# Patient Record
Sex: Female | Born: 1960 | Race: Black or African American | Hispanic: No | Marital: Single
Health system: Southern US, Community
[De-identification: ages and names within clinical notes are randomized; demographics above are authoritative.]

## PROBLEM LIST (undated history)

## (undated) DIAGNOSIS — I10 Essential (primary) hypertension: Secondary | ICD-10-CM

## (undated) DIAGNOSIS — M797 Fibromyalgia: Secondary | ICD-10-CM

## (undated) DIAGNOSIS — F329 Major depressive disorder, single episode, unspecified: Secondary | ICD-10-CM

## (undated) DIAGNOSIS — I2510 Atherosclerotic heart disease of native coronary artery without angina pectoris: Secondary | ICD-10-CM

## (undated) DIAGNOSIS — R519 Headache, unspecified: Secondary | ICD-10-CM

## (undated) DIAGNOSIS — Z86711 Personal history of pulmonary embolism: Secondary | ICD-10-CM

## (undated) DIAGNOSIS — E7800 Pure hypercholesterolemia, unspecified: Secondary | ICD-10-CM

## (undated) DIAGNOSIS — F32A Depression, unspecified: Secondary | ICD-10-CM

## (undated) DIAGNOSIS — Z951 Presence of aortocoronary bypass graft: Secondary | ICD-10-CM

## (undated) DIAGNOSIS — R51 Headache: Secondary | ICD-10-CM

## (undated) HISTORY — PX: CARDIAC SURGERY: SHX584

## (undated) HISTORY — PX: HERNIA REPAIR: SHX51

## (undated) HISTORY — PX: CARPAL TUNNEL RELEASE: SHX101

## (undated) HISTORY — PX: TUBAL LIGATION: SHX77

## (undated) HISTORY — PX: CHOLECYSTECTOMY: SHX55

## (undated) HISTORY — PX: CORONARY ARTERY BYPASS GRAFT: SHX141

## (undated) HISTORY — PX: BACK SURGERY: SHX140

---
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
## 2000-09-01 ENCOUNTER — Encounter: Payer: Self-pay | Admitting: Family Medicine

## 2000-09-01 ENCOUNTER — Ambulatory Visit (HOSPITAL_COMMUNITY): Admission: RE | Admit: 2000-09-01 | Discharge: 2000-09-01 | Payer: Self-pay | Admitting: Family Medicine

## 2001-06-15 ENCOUNTER — Other Ambulatory Visit: Admission: RE | Admit: 2001-06-15 | Discharge: 2001-06-15 | Payer: Self-pay | Admitting: Obstetrics and Gynecology

## 2001-07-29 ENCOUNTER — Emergency Department (HOSPITAL_COMMUNITY): Admission: EM | Admit: 2001-07-29 | Discharge: 2001-07-29 | Payer: Self-pay | Admitting: Emergency Medicine

## 2001-07-29 ENCOUNTER — Encounter: Payer: Self-pay | Admitting: Emergency Medicine

## 2002-01-27 ENCOUNTER — Encounter: Admission: RE | Admit: 2002-01-27 | Discharge: 2002-01-27 | Payer: Self-pay | Admitting: Family Medicine

## 2002-01-27 ENCOUNTER — Encounter: Payer: Self-pay | Admitting: Family Medicine

## 2002-06-28 ENCOUNTER — Encounter: Admission: RE | Admit: 2002-06-28 | Discharge: 2002-09-26 | Payer: Self-pay

## 2003-06-27 ENCOUNTER — Ambulatory Visit (HOSPITAL_BASED_OUTPATIENT_CLINIC_OR_DEPARTMENT_OTHER): Admission: RE | Admit: 2003-06-27 | Discharge: 2003-06-27 | Payer: Self-pay | Admitting: Neurological Surgery

## 2003-09-26 ENCOUNTER — Ambulatory Visit (HOSPITAL_BASED_OUTPATIENT_CLINIC_OR_DEPARTMENT_OTHER): Admission: RE | Admit: 2003-09-26 | Discharge: 2003-09-26 | Payer: Self-pay | Admitting: Neurological Surgery

## 2004-02-17 ENCOUNTER — Inpatient Hospital Stay (HOSPITAL_COMMUNITY): Admission: RE | Admit: 2004-02-17 | Discharge: 2004-02-19 | Payer: Self-pay | Admitting: Neurological Surgery

## 2004-03-19 ENCOUNTER — Encounter: Admission: RE | Admit: 2004-03-19 | Discharge: 2004-03-19 | Payer: Self-pay | Admitting: Neurological Surgery

## 2004-04-16 ENCOUNTER — Encounter: Admission: RE | Admit: 2004-04-16 | Discharge: 2004-04-16 | Payer: Self-pay | Admitting: Neurological Surgery

## 2004-08-07 ENCOUNTER — Encounter: Admission: RE | Admit: 2004-08-07 | Discharge: 2004-08-07 | Payer: Self-pay | Admitting: Neurological Surgery

## 2004-08-07 ENCOUNTER — Emergency Department (HOSPITAL_COMMUNITY): Admission: EM | Admit: 2004-08-07 | Discharge: 2004-08-07 | Payer: Self-pay | Admitting: *Deleted

## 2004-08-14 ENCOUNTER — Encounter: Admission: RE | Admit: 2004-08-14 | Discharge: 2004-08-14 | Payer: Self-pay | Admitting: Neurological Surgery

## 2004-11-05 ENCOUNTER — Encounter: Admission: RE | Admit: 2004-11-05 | Discharge: 2004-11-05 | Payer: Self-pay | Admitting: Neurological Surgery

## 2005-03-05 ENCOUNTER — Encounter: Admission: RE | Admit: 2005-03-05 | Discharge: 2005-03-05 | Payer: Self-pay | Admitting: Neurological Surgery

## 2005-04-12 ENCOUNTER — Inpatient Hospital Stay (HOSPITAL_COMMUNITY): Admission: RE | Admit: 2005-04-12 | Discharge: 2005-04-14 | Payer: Self-pay | Admitting: Neurological Surgery

## 2005-05-03 ENCOUNTER — Ambulatory Visit (HOSPITAL_COMMUNITY): Admission: RE | Admit: 2005-05-03 | Discharge: 2005-05-03 | Payer: Self-pay | Admitting: Unknown Physician Specialty

## 2005-07-09 ENCOUNTER — Encounter: Admission: RE | Admit: 2005-07-09 | Discharge: 2005-07-09 | Payer: Self-pay | Admitting: Neurological Surgery

## 2006-01-20 ENCOUNTER — Encounter: Admission: RE | Admit: 2006-01-20 | Discharge: 2006-01-20 | Payer: Self-pay | Admitting: Neurological Surgery

## 2006-07-21 ENCOUNTER — Encounter: Admission: RE | Admit: 2006-07-21 | Discharge: 2006-07-21 | Payer: Self-pay | Admitting: Neurological Surgery

## 2006-08-25 ENCOUNTER — Emergency Department (HOSPITAL_COMMUNITY): Admission: EM | Admit: 2006-08-25 | Discharge: 2006-08-26 | Payer: Self-pay | Admitting: Emergency Medicine

## 2006-09-10 ENCOUNTER — Emergency Department (HOSPITAL_COMMUNITY): Admission: EM | Admit: 2006-09-10 | Discharge: 2006-09-10 | Payer: Self-pay | Admitting: Emergency Medicine

## 2010-01-31 ENCOUNTER — Emergency Department (HOSPITAL_COMMUNITY): Admission: EM | Admit: 2010-01-31 | Discharge: 2010-01-31 | Payer: Self-pay | Admitting: Emergency Medicine

## 2010-02-13 ENCOUNTER — Encounter: Admission: RE | Admit: 2010-02-13 | Discharge: 2010-02-13 | Payer: Self-pay | Admitting: Pain Medicine

## 2010-12-21 NOTE — Op Note (Signed)
Selena Clark, BALDRIDGE                        ACCOUNT NO.:  [id number]   MEDICAL RECORD NO.:  [id number]                   PATIENT TYPE:  AMB   LOCATION:  DSC                                  FACILITY:  [facility]   PHYSICIAN:  Tia Alert, MD                  DATE OF BIRTH:  Nov 30, 1960   DATE OF PROCEDURE:  06/27/2003  DATE OF DISCHARGE:                                 OPERATIVE REPORT   PREOPERATIVE DIAGNOSIS:  Right carpal tunnel syndrome.   POSTOPERATIVE DIAGNOSIS:  Right carpal tunnel syndrome.   PROCEDURE:  Right carpal tunnel release.   SURGEON:  Tia Alert, MD   ANESTHESIA:  Local regional.   COMPLICATIONS:  None apparent.   INDICATIONS FOR PROCEDURE:  Ms. Hibbitts is a  50 year old black female seen in  neurosurgical consultation  for numbness in her hands  bilaterally. She had  an MRI which showed some cervical spondylosis, but I felt her symptoms were  more  consistent with a carpal tunnel syndrome. She had numbness in the  carpal tunnel distribution in her hands. It was awaking her at night. She  was dropping objects. She had EMGs and PNCVs consistent with bilateral  carpal tunnel syndrome which was worse on the right than on the left, and  her symptoms were worse on the right than on the left. I recommended staged  carpal tunnel release starting on the right side. She understood the  risks,  benefits and alternatives and wished to proceed.   DESCRIPTION OF PROCEDURE:  The patient was taken to the operating room and  local region anesthesia was used. Her right arm was placed in a tourniquet  at the forearm level. The right arm was prepped circumferentially with  Duraprep and then draped  in the usual sterile fashion. Then 5 mL of local  anesthesia was injected and an incision was made in the palmar surface of  the palm from the distal  wrist crease into the palm about 1.5 to 2 cm and  carried down through the palmar fascia.   The fascia was opened and the  transverse carpal ligament was identified and  opened to expose the underlying median nerve. I then spread between  the  median nerve and the transverse carpal ligament with a pair of hemostats and  used the #15 blade  scalpel and micro Metzenbaum scissors to dissect  distally into th palm and release the transverse carpal ligament distally  until the palmar fat was noticed.   I palpated with the hemostat to ensure  complete transection of the  transverse carpal ligament distally and inspected the nerve once again. I  stayed to the ulnar side of the nerve. I then dissected more proximally  under  the wrist crease and I completely transected the transverse carpal  ligament that way also with the #15 blade  scalpel and then with micro  Metzenbaum scissors.  Once this was complete, I inspected the nerve once again and ensured  adequate and complete transection of the transverse carpal ligament. I then  closed the palmar fascia with interrupted 3-0 Vicryl and closed the  subcuticular tissue with interrupted 3-0 Vicryl and closed the skin with  interrupted vertical mattress 4-0 Ethilon sutures. I then wrapped the hand  with a Kerlix and an Ace bandage.   The patient was transported to the recovery room in stable condition. All  sponge, instrument and needle counts were correct at the end of the  procedure.                                               Tia Alert, MD    DSJ/MEDQ  D:  06/27/2003  T:  06/27/2003  Job:  [id number]

## 2010-12-21 NOTE — Op Note (Signed)
Selena Clark, Selena Clark                        ACCOUNT NO.:  [id number]   MEDICAL RECORD NO.:  [id number]                   PATIENT TYPE:  INP   LOCATION:  3010                                 FACILITY:  [facility]   PHYSICIAN:  Tia Alert, MD                  DATE OF BIRTH:  [birth year]   DATE OF PROCEDURE:  02/17/2004  DATE OF DISCHARGE:                                 OPERATIVE REPORT   PREOPERATIVE DIAGNOSIS:  Degenerative disk disease, L3-4 and L4-5, with back  and right leg pain.   POSTOPERATIVE DIAGNOSIS:  Degenerative disk disease, L3-4 and L4-5, with  back and right leg pain.   PROCEDURES:  1. Transforaminal lumbar interbody fusion at L3-4 and L4-5 utilizing a 10 x     26 mm Peek interbody cage packed with autograft and Grafton allograft.  2. Posterolateral arthrodesis, L3 to L5 on the right, utilizing locally-     harvested morcellized autologous autograft and Grafton allograft.  3. Segmental fixation, L3 to L5 on the left, utilizing the Encompass pedicle     screw system, and on the right utilizing a transfacet screw at L3-4.   SURGEON:  Tia Alert, M.D.   ASSISTANT:  Reinaldo Meeker, M.D.   ANESTHESIA:  General endotracheal.   COMPLICATIONS:  None apparent.   INDICATION FOR PROCEDURE:  Selena Clark is a 50 year old black female who  presented to the neurosurgery clinic with complaints of a long history of  back pain.  She had an MRI which showed severe degenerative disk disease at  L3-4 and L4-5.  She had a discogram that showed pain at L3-4 and L4-5.  She  had tried medical management for quite some time without significant relief.  I recommended a transforaminal lumbar interbody fusion at L3-4 and L4-5 to  address her degenerative disk disease and back pain.  She understood the  risks, benefits, and alternatives and wished to proceed.   DESCRIPTION OF PROCEDURE:  The patient was taken to the operating room and  after induction of adequate generalized  endotracheal anesthesia, she was  rolled in a prone position on the Wilson frame and all pressure points were  padded.  Her lumbar region was prepped with Duraprep and then draped in the  usual sterile fashion.  A midline incision was made and carried down to the  lumbosacral fascia, which was opened.  The paraspinous musculature was taken  down in the subperiosteal fashion to expose the L3-4 and L4-5 interspaces  bilaterally.  Intraoperative fluoroscopy confirmed our level.  I then used a  Leksell rongeur and the Kerrison punches to remove the spinous processes at  L3 and L4 and perform a hemilaminectomy, hemifacetectomy, and foraminotomy  at L3-4 and L4-5 on the left side.  Once the decompression was complete, the  disk space was incised at L3-4 and the diskectomy was performed with a  pituitary rongeur and curved  curettes of the T-LIF system.  A complete  diskectomy was performed.  The end plates were prepared with a curette.  The  disk space was distracted over 10 mm and then the interspace was packed with  locally-harvested morcellized autologous bone graft mixed with Grafton  putty, and then a 10 x 26 mm Peek interbody cage packed with autograft and  Grafton putty was then tapped into the interspace at L3-4 from the inside to  perform the T-LIF.  At L4-5 this was done in the same way.  The annulus was  incised and the initial diskectomy was done with pituitary rongeurs and then  curettes, and rotating cutters were used to prepare the end plates for  arthrodesis, and then the interspace was packed with autograft and Grafton  putty, and then a 10 x 26 mm Peek interbody cage packed with autograft was  then tapped into position at L4-5, and then both grafts were checked under  fluoroscopy for adequate placement.  We then turned our attention to the  segmental fixation.  The pedicle screw entry zones at L3, L4, and L5 were  identified on the left side and each pedicle was probed, tapped  with a 5.5  tap, and then 6.5 x 45 mm pedicle screws were placed into the pedicles of  L3, L4, and L5 on the left.  A lordotic rod was placed into the multiaxial  screw heads and the locking caps were placed and tightened with the anti-  torque device.  We then turned our attention to placement of transfacet  screws on the right side.  At L3-4 the starting hole was identified with  fluoroscopy and then in both AP and lateral projection planes, a hole was  drilled through the facet joint into the pedicle and a 35 mm transfacet  screw was placed at L3-4.  We did use NeuroVision intraoperative EMG  monitoring to test placement of the screw and biplanar fluoroscopy.  At L4-5  we were unable to get good purchase with the transfacet screw, and this was  aborted.  The wound was then irrigated with copious amounts of bacitracin-  containing saline solution.  The posterior elements were then decorticated  on the patient's right side, and a mixture of autograft with Grafton putty  was then placed over these for arthrodesis from L3 to L5 on the right.  A  medium Hemovac drain was then placed and the musculature and fascia were  closed with #1 Vicryl, the subcutaneous and subcuticular tissues were closed  with 2-0 and 3-0 Vicryl, and the skin was closed with Benzoin and Steri-  Strips.  The drapes were removed.  A sterile dressing was applied.  The  patient was awakened from general anesthesia and transported to the recovery  room in stable condition.  At the end of the procedure all sponge, needle,  and instrument counts were correct.                                               Tia Alert, MD    DSJ/MEDQ  D:  02/17/2004  T:  02/17/2004  Job:  [id number]

## 2010-12-21 NOTE — Op Note (Signed)
[name], Selena Clark                        ACCOUNT NO.:  [id number]   MEDICAL RECORD NO.:  [id number]                   PATIENT TYPE:  AMB   LOCATION:  DSC                                  FACILITY:  [facility]   PHYSICIAN:  Tia Alert, MD                  DATE OF BIRTH:  27-Jun-1961   DATE OF PROCEDURE:  09/26/2003  DATE OF DISCHARGE:                                 OPERATIVE REPORT   PREOPERATIVE DIAGNOSIS:  Left carpal tunnel syndrome.   POSTOPERATIVE DIAGNOSIS:  Left carpal tunnel syndrome.   OPERATION PERFORMED:  Left carpal tunnel release.   SURGEON:  Tia Alert, MD   ANESTHESIA:  Local standby.   COMPLICATIONS:  None apparent.   INDICATIONS FOR PROCEDURE:  [name] is a pleasant 50 year old black female  who had bilateral carpal tunnel syndrome.  She had undergone a previous  right carpal tunnel syndrome and in a planned staged release, we brought her  back for a left carpal tunnel release today.  She understood the risks,  benefits and alternatives and wished to proceed with the surgery.   DESCRIPTION OF PROCEDURE:  The patient was in the operating room in the  supine position.  She had a Bier block done to the left forearm.  Then the  left hand and forearm were prepped circumferentially with DuraPrep and then  draped in the usual sterile fashion.  I did use 9 mL of local anesthesia and  then made an incision in the palm just distal to the distal wrist crease and  carried this 2 cm into the palm.  I dissected down to the palmar fascia,  opened this, identified the transverse carpal ligament and opened this with  a 15 blade scalpel until I could see the median nerve underneath.  I then  spread between the median nerve and the transverse carpal ligament with a  hemostasis and dissected proximally into the arm until the transverse carpal  ligament was completely released.  I palpated with a hemostat to make sure  there was complete release of the ligament.  I then  dissected more distally  into the hand until I could reach the palmar fat and then I felt to make  sure the ligament was completely transected more distally into the palm.  I  was very thickened in the palm.  Once this was done, I inspected the nerve  once again.  The nerve looked good.  I then irrigated with saline solution  and closed the fascia with interrupted 3-0 Vicryl, closed the subcuticular  tissue with interrupted 3-0 Vicryl and closed the skin with interrupted 4-0  Ethilon vertical mattress sutures.  The hand was then wrapped in the usual  fashion.  The patient was awakened and taken to the recovery room in stable  condition.  At the end of the procedure, all sponge, needle and instrument  counts were correct.  Tia Alert, MD    DSJ/MEDQ  D:  09/26/2003  T:  09/26/2003  Job:  [id number]

## 2010-12-21 NOTE — Op Note (Signed)
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   PROCEDURES:  1.  Lumbar re-exploration with exploration of fusion L3 to L5 with removal      of transfacet screw L3-4 on the right and pedicle screw at L5 on the      left.  2.  Segmental fixation L3 to L5 with pedicle screws on the right and      replacement of the L5 pedicle screw on the left with a 7.5 x 45 mm      pedicle screw. (Encompass pedicle screw system).  3.  Intertransverse arthrodesis at L3 to L5 bilaterally utilizing a mixture      of BMP with VITOSS soaked with bone marrow aspirate which was obtained      from the right iliac crest through a separate fascial incision.   SURGEON:  Dr. Marikay Alar.   ASSISTANT:  Donalee Citrin, M.D.   ANESTHESIA:  General endotracheal.   COMPLICATIONS:  None apparent.   INDICATIONS FOR PROCEDURE:  [name] is a 50 year old female who underwent  a two-level TLIF L3-4 and L4-5 14 months ago who developed progressive  worsening of low back pain with left leg pain. She had a CT scan which  showed an apparent pseudoarthrosis L3-4 and L4-5 with lucency around the  pedicle screw at L5 on the left. She had a transfacet screw at L3-4 on the  right.  I recommended lumbar re-exploration with removal of the hardware and  replacement with segmental pedicle screws on the right and replacement of  the L5 pedicle screw on the left with repeat fusion with BMP. She understood  the  risks, benefits, expected outcome and wished to proceed.   DESCRIPTION OF PROCEDURE:  The patient was taken to the operating room after  induction of adequate generalized endotracheal anesthesia.  She was rolled  into the prone position on the Wilson frame and all pressure points were  padded. Her lumbar region was prepped with DuraPrep and draped in the usual  sterile fashion.  Local anesthesia 5 mL injected and then a dorsal midline  incision was made and carried down to the lumbosacral fascia. The fascia was  opened and the paraspinous musculature was taken down in subperiosteal  fashion at L2 and at L5.  I was able to identify the lamina of L2 and L5 and  then carry the muscle dissection out over the hardware.  The pedicle screws  were easily identifiable from L3 to L5 on the left side.  I was able  to  carry the dissection out over these and identify the transverse processes  from L3 to L5 on the left.  I was able to remove a lot of the scar tissue  from the epidural space and identify the lamina and facette complexes at L3-  4 and L4-5 on the right side. The transfacet screw was easily identified and  then was removed. I carried the dissection out over the transverse processes  at L3 to L5 on the right side also.  Self-retaining retractors were placed.  The locking caps were removed from the pedicle screw and rod system on the  left side. The L5 pedicle screw on the left was quite loose.  I removed this  with a pedicle screw inserter, removed the scar tissue from around the  pedicle screw entry site and from inside the pedicle and then placed a 7.5 x  45 mm pedicle screw into the pedicle at L5 on the left side and achieved  what felt like good purchase intraoperatively.  I then tested this with a  Leksell rongeur. It seemed quite tight. We then localized pedicle scrunchy  zones at L3, L4 and L5 on the right side. We probed each pedicle with the  pedicle probe, tapped each pedicle with  a 5/5 tap and then placed 6.5 x 45  mm pedicle screws into the L3, L4 and L5 pedicles on the patient's right  side utilizing fluoroscopic guidance. We then decorticated the transverse  processes bilaterally. I dissected between the fascia and subcutaneous  tissues on the right side and exposed the right iliac crest and then used a  Tuohy needle to do a bone marrow aspirate from three separate places in the  right iliac crest and then used this to soak the VITOSS.  We then placed the  VITOSS within the BMP soaked sponge and placed this out over the transverse  processes of L3 to L5 bilaterally for intertransverse arthrodesis. There was  a small amount of autograft which was also mixed into this.  Prior to  placing this, we did the irrigate with copious amounts of bacitracin  containing saline solution. We then placed the rods into the multiaxial  screw heads of the pedicle screws and locked these position with a locking  caps and the antitorque device and placed a separate cross-link. We then  placed a medium Hemovac drain through a separate stab incision, checked our  construct once again and then closed the fascia with interrupted #1 Vicryl,  closed the subcutaneous and subcuticular tissue with 2-0 and 3-0 Vicryl and  closed the skin with Benzoin and Steri-Strips. The drapes were removed.  Sterile dressing was applied. The patient was awakened from general  anesthesia and was transported to the recovery room in stable condition. At  the end of the procedure, all sponge, needle and instrument counts were  correct.      Tia Alert, MD  Electronically Signed     DSJ/MEDQ  D:  04/12/2005  T:  04/12/2005  Job:  [id number]

## 2010-12-21 NOTE — Consult Note (Signed)
NAMEJERIE, [name]                        ACCOUNT NO.:  [id number]   MEDICAL RECORD NO.:  [id number]                   PATIENT TYPE:  REC   LOCATION:  [facility]                                  FACILITY:  [facility]   PHYSICIAN:  Selena Clark, D.O.                 DATE OF BIRTH:  08-23-1960   DATE OF CONSULTATION:  06/30/2002  DATE OF DISCHARGE:                                   CONSULTATION   REFERRING PHYSICIAN:  Betti D. Selena Clark, M.D.   Dear Dr. Pecola Clark,   Thank you very much for kindly referring Ms. Selena Clark to the center  for pain and rehabilitative medicine for evaluation. Selena Clark was seen in  the clinic today. Please refer to the following for details regarding the  history and physical examination and treatment recommendations. Once again,  thank you for allowing [place] to participate in the care of Selena Clark.   CHIEF COMPLAINT:  Pain in hips and back.   HISTORY OF PRESENT ILLNESS:  Selena Clark is a pleasant 50 year old right hand  dominant female with a history of fibromyalgia syndrome who complains of low  back pain, occasionally radiating into her right buttock and posterior thigh  as well as bursitis in her hips and tendinitis in her hands. The patient has  been evaluated previously by Dr. Sheran Clark at [facility] orth[facility]ics in  2002 for her low back pain, and she apparently underwent some type of spinal  injections in May and June of 2002 with temporary relief. She was also  followed in physical therapy in 2002 for her lower back pain without any  significant improvement. She has also been followed by Dr. Stacey Clark  for chronic diffuse soft tissue pain and has been diagnosed with  fibromyalgia syndrome. According to the patient, laboratory workup was  negative for rheumatoid arthritis or lupus. She relates that her mother had  died secondary to lupus, and she also has an aunt and cousin with lupus. Dr.  Kellie Clark also diagnosed her with bilateral de  Quervain tenosynovitis and  bilateral trochanteric bursitis. She apparently underwent a right  trochanteric bursal steroid injection without any significant improvement.  Selena Clark has prescribed wrist splints for her which she wears on a daily  basis. She has been treated with various medications including Darvocet,  amitriptyline, hydrocodone, OxyContin, Flexeril, and Ultracet. She is  somewhat apprehensive about taking the narcotic based pain medication. She  is currently taking amitriptyline 25 mg at bedtime which has improved her  sleep to some degree but has also caused her to gain in excess of 15 pounds.  She states that the Ultracet and Flexeril seem to help her in the past, but  her medications have been changed around for various reasons. Currently, her  exercise program includes walking one mile per day, and she states that Dr.  Pecola Clark had recommended that she join the [facility] for  aquatic therapy which she  anticipates doing. She has also got involved with a behavioral health  psychologist. Her pain today is an 8/10 on subjective scale, mainly  involving her right lower extremity, bilateral shoulders, and bilateral hips  as well as the thumbs bilaterally. She describes her pain as constant,  sharp, and burning. Symptoms are worse with walking, bending, sitting, and  improved with medications to some degree. Her function and quality of life  indices have declined overall. Her sleep is fair. I reviewed the health and  history form and 14-point review of systems. The patient has had a MRI of  her lumbar spine, but I do not have a copy of this to review at this time.   PAST MEDICAL HISTORY:  Denies.   PAST SURGICAL HISTORY:  Denies.   FAMILY HISTORY:  Disability and lupus.   SOCIAL HISTORY:  The patient smokes one pack of cigarettes per day, and I  counseled her on the importance of smoking cessation in terms of pain and  overall health. She admits to occasionally alcohol use and  denies illicit  drug use. She is married and has three sons, ages 55, 76, and 40 and four  stepchildren. She is not currently working.   ALLERGIES:  NSAIDs.   MEDICATIONS:  Amitriptyline 25 mg at bedtime and previous use of Darvocet,  hydrocodone, and OxyContin as well as Flexeril and Ultracet.   PHYSICAL EXAMINATION:  Reveals a healthy female in no acute distress. Blood  pressure is 130/77, pulse 101, respirations 16, O2 saturation is 98% on room  air. Examination of the spine reveals mildly increased lumbar lordosis with  normal cervical lordosis and thoracic kyphosis. Range of motion of the  cervical spine is full in all planes without discomfort. Range of motion of  the upper extremities is full in all planes without discomfort as well.  Range of motion of the lumbar spine is full in all planes with pain mainly  on extension and extension plus rotation, reproducing her low back pain  symptoms. Palpatory examination reveals minimal tenderness to palpation in  the lumbar and cervical paraspinal region. She has fibromyalgia tender  points positive in the left upper trapezius, bilateral anterior cervical,  left gluteus medius, and right greater trochanter. Manual muscle testing is  5/5 bilateral upper and lower extremities in all muscle groups tested.  Sensory examination is intact to light touch bilateral upper and lower  extremities with the exception of mild decreased light touch in bilateral  thumbs, index fingers, and middle fingers. Muscle stretch reflexes are 2+/4  bilateral biceps, triceps, brachial radialis, pronator teres, patellar,  medial hamstrings, and Achilles. There is no heat, erythema, or edema in the  upper and lower extremities. Tinel test is positive bilateral wrists over  the medial nerves, reproducing paresthesias. Phalen is negative bilaterally.  Finkelstein maneuver is positive bilaterally with tenderness over the first carpal metacarpal and metacarpal  phalangeal joints bilaterally. There is no  synovitis noted.   IMPRESSION:  1. Fibromyalgia syndrome.  2. Chronic low back pain. I suspect that patient has lumbar facet     arthropathy contributing to her current back pain symptoms; however,     cannot conclusively rule out degenerative disk disease as a culprit as     well.  3. Lollie Sails tenosynovitis.  4. Carpal tunnel syndrome.   RECOMMENDATIONS:  1. In terms of patient's fibromyalgia syndrome, I agree with aquatic therapy     and sleep restoration. I have encouraged  her as well to join the [facility] for     aquatic therapy. In terms of her sleep, medications would continue with     amitriptyline but consider switching back to Flexeril which may not have     as much in the way of weight gain. Would also consider Zanaflex 4 mg at     bedtime.  2. In terms of patient's low back pain, would like to see what her response     is from aquatic therapy with a good stretching program which may decrease     her back pain significantly; however, if symptoms are not improved, would     consider a trial of lumbar facet injections diagnostically and     therapeutically if these have not already been done. I would be happy to     perform these if her symptoms are not improved. Prior to any type of     intervention, would need to get a copy of the MRI as well as Dr. Grant Fontana     notes from previous spinal injections.  3. In terms of patient's de Quervain's tenosynovitis, would have her     continue with the wrist splints and continue occupational therapy for     ultrasound and an exercise program. If symptoms are not improved further,     would consider local steroid injections into the tendon sheath and would     be happy to perform these if symptoms are not improving.  4. In terms of patient's carpal tunnel syndrome, would have her continue     with the wrist splints and consider electrodiagnostic studies if symptoms     are not improving or  worsening with consideration for orthopedic hand     surgery referral for possible carpal tunnel release.  5. The patient instructed to followup with her primary care Joven Mom. These     issues and recommendations were     discussed with Selena Clark in detail, and I informed her that I would be     happy to reevaluate her for her back pain and de Quervain tenosynovitis     at a later date if symptoms are not improving with the above recommended     conservative measures. The patient understands. There were no barriers to     communication.                                               Selena Clark, D.O.    JJW/MEDQ  D:  06/30/2002  T:  07/01/2002  Job:  [id number]   cc:   Jocelyn Lamer D. Selena Clark, M.D.  [phone] [address], [address]  [place]  [place] [address]  Fax: [phone]

## 2012-08-25 ENCOUNTER — Ambulatory Visit (HOSPITAL_COMMUNITY)
Admission: RE | Admit: 2012-08-25 | Discharge: 2012-08-25 | Disposition: A | Discharge status: Home / Self Care | Payer: Medicare Other | Source: Ambulatory Visit | Attending: Family Medicine | Admitting: Family Medicine

## 2012-08-25 ENCOUNTER — Other Ambulatory Visit (HOSPITAL_COMMUNITY): Payer: Self-pay | Admitting: Family Medicine

## 2012-08-25 DIAGNOSIS — M25569 Pain in unspecified knee: Secondary | ICD-10-CM | POA: Insufficient documentation

## 2012-08-25 DIAGNOSIS — S8990XA Unspecified injury of unspecified lower leg, initial encounter: Secondary | ICD-10-CM | POA: Insufficient documentation

## 2012-08-25 DIAGNOSIS — R5381 Other malaise: Secondary | ICD-10-CM | POA: Insufficient documentation

## 2012-08-25 DIAGNOSIS — W19XXXA Unspecified fall, initial encounter: Secondary | ICD-10-CM

## 2012-11-26 ENCOUNTER — Encounter (HOSPITAL_COMMUNITY): Payer: Self-pay

## 2012-11-26 ENCOUNTER — Emergency Department (HOSPITAL_COMMUNITY): Payer: Medicare Other

## 2012-11-26 ENCOUNTER — Emergency Department (HOSPITAL_COMMUNITY)
Admission: EM | Admit: 2012-11-26 | Discharge: 2012-11-26 | Disposition: A | Discharge status: Home / Self Care | Payer: Medicare Other | Attending: Emergency Medicine | Admitting: Emergency Medicine

## 2012-11-26 DIAGNOSIS — R269 Unspecified abnormalities of gait and mobility: Secondary | ICD-10-CM | POA: Insufficient documentation

## 2012-11-26 DIAGNOSIS — IMO0002 Reserved for concepts with insufficient information to code with codable children: Secondary | ICD-10-CM | POA: Insufficient documentation

## 2012-11-26 DIAGNOSIS — Z79899 Other long term (current) drug therapy: Secondary | ICD-10-CM | POA: Insufficient documentation

## 2012-11-26 DIAGNOSIS — X500XXA Overexertion from strenuous movement or load, initial encounter: Secondary | ICD-10-CM | POA: Insufficient documentation

## 2012-11-26 DIAGNOSIS — IMO0001 Reserved for inherently not codable concepts without codable children: Secondary | ICD-10-CM | POA: Insufficient documentation

## 2012-11-26 DIAGNOSIS — F329 Major depressive disorder, single episode, unspecified: Secondary | ICD-10-CM | POA: Insufficient documentation

## 2012-11-26 DIAGNOSIS — E7800 Pure hypercholesterolemia, unspecified: Secondary | ICD-10-CM | POA: Insufficient documentation

## 2012-11-26 DIAGNOSIS — F17200 Nicotine dependence, unspecified, uncomplicated: Secondary | ICD-10-CM | POA: Insufficient documentation

## 2012-11-26 DIAGNOSIS — M549 Dorsalgia, unspecified: Secondary | ICD-10-CM

## 2012-11-26 DIAGNOSIS — I10 Essential (primary) hypertension: Secondary | ICD-10-CM | POA: Insufficient documentation

## 2012-11-26 DIAGNOSIS — Z8739 Personal history of other diseases of the musculoskeletal system and connective tissue: Secondary | ICD-10-CM | POA: Insufficient documentation

## 2012-11-26 DIAGNOSIS — I2510 Atherosclerotic heart disease of native coronary artery without angina pectoris: Secondary | ICD-10-CM | POA: Insufficient documentation

## 2012-11-26 DIAGNOSIS — Y929 Unspecified place or not applicable: Secondary | ICD-10-CM | POA: Insufficient documentation

## 2012-11-26 DIAGNOSIS — Y939 Activity, unspecified: Secondary | ICD-10-CM | POA: Insufficient documentation

## 2012-11-26 DIAGNOSIS — F3289 Other specified depressive episodes: Secondary | ICD-10-CM | POA: Insufficient documentation

## 2012-11-26 HISTORY — DX: Essential (primary) hypertension: I10

## 2012-11-26 HISTORY — DX: Pure hypercholesterolemia, unspecified: E78.00

## 2012-11-26 HISTORY — DX: Fibromyalgia: M79.7

## 2012-11-26 HISTORY — DX: Major depressive disorder, single episode, unspecified: F32.9

## 2012-11-26 HISTORY — DX: Atherosclerotic heart disease of native coronary artery without angina pectoris: I25.10

## 2012-11-26 HISTORY — DX: Depression, unspecified: F32.A

## 2012-11-26 MED ORDER — OXYCODONE-ACETAMINOPHEN 5-325 MG PO TABS: 2.0000 | ORAL_TABLET | Freq: Once | ORAL | Status: DC

## 2012-11-26 NOTE — ED Notes (Signed)
Pt presents with onset of low back pain after twisting to place a coffee cup into cabinet today at 0900.  Pt reports h/o back pain with multiple surgeries.  Pt reports pain radiates into L buttock, denies any urinary or fecal incontinence.

## 2012-11-26 NOTE — ED Provider Notes (Signed)
Medical screening examination/treatment/procedure(s) were performed by non-physician practitioner and as supervising physician I was immediately available for consultation/collaboration.  Henriette Hesser M Malone Admire, MD 11/26/12 1400 

## 2012-11-26 NOTE — ED Notes (Signed)
Patient states chronic back pain, but claims "i injured it today.  I have a lot of hardware in my back".  Patient ambulated from wheelchair to bed with no problem.

## 2012-11-26 NOTE — ED Provider Notes (Signed)
History     CSN: [id number]  Arrival date & time 11/26/12  1059   First MD Initiated Contact with Patient 11/26/12 1135      No chief complaint on file.   (Consider location/radiation/quality/duration/timing/severity/associated sxs/prior treatment) The history is provided by the patient. No language interpreter was used.  Pt is a 52yo female with hx of 2 spinal surgeries c/o 1 day hx of increased back pain.  Pain is sharp in nature, 12-13/10 on pain scale.  Pt states pain started after rotating at the waist while doing dishes.  She felt a pop and is concerned she did some damage.  Neurosurgeon is Dr. Marikay Alar.  She has not called him about this pain.  Has been taking percocet.  States she is in pain management.  Not concerned about pain meds, but worried about changes in her back.  Denies fever, n/v/d, chest pain, abdominal pain, sob, numbness or tingling in legs or groin.  No loss of bowel or bladder.   Past Medical History  Diagnosis Date  . Hypertension   . Fibromyalgia   . Hypercholesteremia   . Depression   . Coronary artery disease     Past Surgical History  Procedure Laterality Date  . Back surgery    . Cardiac surgery    . Cholecystectomy    . Hernia repair    . Tubal ligation    . Carpal tunnel release      History reviewed. No pertinent family history.  History  Substance Use Topics  . Smoking status: Current Every Day Smoker -- 1.00 packs/day  . Smokeless tobacco: Not on file  . Alcohol Use: No    OB History   Grav Para Term Preterm Abortions TAB SAB Ect Mult Living                  Review of Systems  Constitutional: Negative for fever and chills.  Gastrointestinal: Negative for nausea, vomiting and diarrhea.  Musculoskeletal: Positive for myalgias, back pain and gait problem ( pain).    Allergies  Albuterol and Aspirin  Home Medications   Current Outpatient Rx  Name  Route  Sig  Dispense  Refill  . atenolol (TENORMIN) 50 MG tablet    Oral   Take 100 mg by mouth daily.         Marland Kitchen atorvastatin (LIPITOR) 80 MG tablet   Oral   Take 80 mg by mouth daily.         . clopidogrel (PLAVIX) 75 MG tablet   Oral   Take 75 mg by mouth daily.         . cyclobenzaprine (FLEXERIL) 10 MG tablet   Oral   Take 30 mg by mouth 3 (three) times daily as needed for muscle spasms.         Marland Kitchen escitalopram (LEXAPRO) 10 MG tablet   Oral   Take 10 mg by mouth daily.         Marland Kitchen oxyCODONE-acetaminophen (PERCOCET) 10-325 MG per tablet   Oral   Take 1 tablet by mouth every 6 (six) hours as needed for pain.         . pantoprazole (PROTONIX) 20 MG tablet   Oral   Take 20 mg by mouth 2 (two) times daily.         . pregabalin (LYRICA) 150 MG capsule   Oral   Take 150 mg by mouth 2 (two) times daily.         Marland Kitchen  tiZANidine (ZANAFLEX) 2 MG tablet   Oral   Take 2 mg by mouth every 6 (six) hours as needed (pain).         . traZODone (DESYREL) 150 MG tablet   Oral   Take 150 mg by mouth at bedtime as needed for sleep.         Marland Kitchen zolpidem (AMBIEN) 10 MG tablet   Oral   Take 10 mg by mouth at bedtime as needed for sleep.           BP 112/84  Pulse 92  Temp(Src) 97.6 F (36.4 C) (Oral)  Resp 20  SpO2 99%  Physical Exam  Constitutional: She is oriented to person, place, and time. She appears well-developed and well-nourished. No distress.  Laying on exam bed, NAD.  HENT:  Head: Normocephalic and atraumatic.  Eyes: Conjunctivae and EOM are normal. Pupils are equal, round, and reactive to light. Right eye exhibits no discharge. Left eye exhibits no discharge. No scleral icterus.  Neck: Normal range of motion. Neck supple.  Cardiovascular: Normal rate, regular rhythm and normal heart sounds.   Pulmonary/Chest: Effort normal and breath sounds normal. No respiratory distress. She has no wheezes. She has no rales. She exhibits no tenderness.  Abdominal: Soft. Bowel sounds are normal. She exhibits no distension. There is  no tenderness.  Musculoskeletal: Normal range of motion. She exhibits tenderness ( paraspinal muscles of lumbar spine. L > R).  Neurological: She is alert and oriented to person, place, and time. She has normal reflexes. She displays normal reflexes. No cranial nerve deficit. She exhibits normal muscle tone. Coordination normal.  CN II-XII in tact. 5/5 grip strength Neg romberg. Antalgic gait, uses cane for assistance.   Skin: Skin is warm and dry. She is not diaphoretic.    ED Course  Procedures (including critical care time)  Labs Reviewed - No data to display Dg Lumbar Spine Complete  11/26/2012  *RADIOLOGY REPORT*  Clinical Data: Pain after twisting injury  LUMBAR SPINE - COMPLETE 4+ VIEW  Comparison: August 25, 2012  Findings: Frontal, lateral, bilateral oblique, and spot lumbosacral lateral images were obtained.  There are five non-rib bearing lumbar type vertebral bodies. There is no fracture or spondylolisthesis.  There are pedicle screws placed at L3, L4, and L5 bilaterally with alignment unchanged.  There is disc narrowing at L3-4 and L4-5 with spacers at these levels, stable.  There is no new disc space narrowing.  There is no appreciable exit foraminal narrowing on the oblique views.  There are common iliac artery stents bilaterally.  IMPRESSION: Postoperative change and osteoarthritic change, stable. No fracture or spondylolisthesis.   Original Report Authenticated By: Bretta Bang, M.D.      1. Back pain       MDM  Pt c/o back pain after twisting motion while doing dishes.  Reports hearing a pop.  Concerned hardware has shifted.  Denies fever, n/v/d.  No saddle paraesthesia.  CN II-XII in tact.  5/5 grip strength, antalgic gait, ambulates with assistance of cane.    Declined pain medication.  Stated she was more concerned she did damage to her spine.  States she has percocet in her purse right now.   Plain film lumbar spine: postoperative change and osteoarthritic change,  stable. No fx or spondylolisthesis.    Will have pt continue taking previously prescribed pain medication.  Pt states she goes to pain management.  Also advised pt to f/u with Dr. Marikay Alar, neurosurgery for further evaluation  and management of back pain concerns.  Reassured pt of today's findings.  Hardware is stable and not "broken"  Advised pt she may have strained her back muscles.  Alternate ice and heat treatment as preferred.  Gentle stretching w/o heavy lifting.  Avoid sudden twisting motions.   Vitals: unremarkable. Discharged in stable condition.    Discussed pt with attending during ED encounter.        Junius Finner, PA-C 11/26/12 1336

## 2013-04-18 ENCOUNTER — Emergency Department (HOSPITAL_COMMUNITY): Payer: Medicare Other

## 2013-04-18 ENCOUNTER — Other Ambulatory Visit: Payer: Self-pay

## 2013-04-18 ENCOUNTER — Emergency Department (HOSPITAL_COMMUNITY)
Admission: EM | Admit: 2013-04-18 | Discharge: 2013-04-18 | Disposition: A | Discharge status: Home / Self Care | Payer: Medicare Other | Attending: Emergency Medicine | Admitting: Emergency Medicine

## 2013-04-18 ENCOUNTER — Encounter (HOSPITAL_COMMUNITY): Payer: Self-pay | Admitting: Emergency Medicine

## 2013-04-18 DIAGNOSIS — Z7902 Long term (current) use of antithrombotics/antiplatelets: Secondary | ICD-10-CM | POA: Insufficient documentation

## 2013-04-18 DIAGNOSIS — E7800 Pure hypercholesterolemia, unspecified: Secondary | ICD-10-CM | POA: Insufficient documentation

## 2013-04-18 DIAGNOSIS — M25519 Pain in unspecified shoulder: Secondary | ICD-10-CM | POA: Insufficient documentation

## 2013-04-18 DIAGNOSIS — G510 Bell's palsy: Secondary | ICD-10-CM | POA: Insufficient documentation

## 2013-04-18 DIAGNOSIS — I10 Essential (primary) hypertension: Secondary | ICD-10-CM | POA: Insufficient documentation

## 2013-04-18 DIAGNOSIS — F3289 Other specified depressive episodes: Secondary | ICD-10-CM | POA: Insufficient documentation

## 2013-04-18 DIAGNOSIS — F329 Major depressive disorder, single episode, unspecified: Secondary | ICD-10-CM | POA: Insufficient documentation

## 2013-04-18 DIAGNOSIS — R5381 Other malaise: Secondary | ICD-10-CM | POA: Insufficient documentation

## 2013-04-18 DIAGNOSIS — G43909 Migraine, unspecified, not intractable, without status migrainosus: Secondary | ICD-10-CM | POA: Insufficient documentation

## 2013-04-18 DIAGNOSIS — I2510 Atherosclerotic heart disease of native coronary artery without angina pectoris: Secondary | ICD-10-CM | POA: Insufficient documentation

## 2013-04-18 DIAGNOSIS — Z79899 Other long term (current) drug therapy: Secondary | ICD-10-CM | POA: Insufficient documentation

## 2013-04-18 DIAGNOSIS — F17200 Nicotine dependence, unspecified, uncomplicated: Secondary | ICD-10-CM | POA: Insufficient documentation

## 2013-04-18 HISTORY — DX: Headache, unspecified: R51.9

## 2013-04-18 HISTORY — DX: Headache: R51

## 2013-04-18 LAB — PROTIME-INR
INR: 0.94 (ref 0.00–1.49)
Prothrombin Time: 12.4 seconds (ref 11.6–15.2)

## 2013-04-18 LAB — CBC
Hemoglobin: 13.6 g/dL (ref 12.0–15.0)
MCV: 91.7 fL (ref 78.0–100.0)
Platelets: 227 10*3/uL (ref 150–400)
RBC: 4.59 MIL/uL (ref 3.87–5.11)
WBC: 6.4 10*3/uL (ref 4.0–10.5)

## 2013-04-18 LAB — COMPREHENSIVE METABOLIC PANEL
ALT: 23 U/L (ref 0–35)
Alkaline Phosphatase: 126 U/L — ABNORMAL HIGH (ref 39–117)
BUN: 12 mg/dL (ref 6–23)
CO2: 25 mEq/L (ref 19–32)
Chloride: 104 mEq/L (ref 96–112)
GFR calc Af Amer: 74 mL/min — ABNORMAL LOW (ref >90)
GFR calc non Af Amer: 64 mL/min — ABNORMAL LOW (ref >90)
Glucose, Bld: 86 mg/dL (ref 70–99)
Potassium: 4 mEq/L (ref 3.5–5.1)
Sodium: 138 mEq/L (ref 135–145)
Total Bilirubin: 0.3 mg/dL (ref 0.3–1.2)

## 2013-04-18 LAB — DIFFERENTIAL
Lymphocytes Relative: 47 % — ABNORMAL HIGH (ref 12–46)
Lymphs Abs: 3 10*3/uL (ref 0.7–4.0)
Monocytes Relative: 7 % (ref 3–12)
Neutrophils Relative %: 44 % (ref 43–77)

## 2013-04-18 MED ORDER — ONDANSETRON HCL 4 MG/2ML IJ SOLN
4.0000 mg | Freq: Once | INTRAMUSCULAR | Status: AC
  Administered 2013-04-18: 4 mg via INTRAVENOUS
  Filled 2013-04-18: qty 2

## 2013-04-18 MED ORDER — METHYLPREDNISOLONE 4 MG PO KIT: PACK | ORAL | Status: DC

## 2013-04-18 MED ORDER — MORPHINE SULFATE 4 MG/ML IJ SOLN
4.0000 mg | INTRAMUSCULAR | Status: DC | PRN
  Administered 2013-04-18: 4 mg via INTRAVENOUS
  Filled 2013-04-18: qty 1

## 2013-04-18 NOTE — ED Provider Notes (Signed)
CSN: [id number]     Arrival date & time 04/18/13  1722 History   First MD Initiated Contact with Patient 04/18/13 1827     Chief Complaint  Patient presents with  . Headache  . Dizziness    HPI   52 year old right-handed female with symptoms since early this morning. She awakened with a feeling that her right side of her face was swollen. She looked in the mirror and felt that her face was drooping. She went to the church stating "if I'm going to have a stroke, I'm going to have it at church". While she was at church her symptoms seemed to improve. He is a rather persistent feeling that her eye does not close completely although exam is normal. She has a headache and some bilateral parietal occipital. His aching in the right posterior shoulder. At no time she thought she had disuse of her arm. She's not been aphasic at anytime today. She has a history of migraines and states this is completely different than her typical migraine headaches. She is hypertensive, she continues to smoke, she has known coronary disease and underwent bypass surgery one year ago. Mother had a stroke in early age she does not of the exact age. Past Medical History  Diagnosis Date  . Hypertension   . Fibromyalgia   . Hypercholesteremia   . Depression   . Coronary artery disease   . Headache    Past Surgical History  Procedure Laterality Date  . Back surgery    . Cardiac surgery    . Cholecystectomy    . Hernia repair    . Tubal ligation    . Carpal tunnel release     History reviewed. No pertinent family history. History  Substance Use Topics  . Smoking status: Current Every Day Smoker -- 1.00 packs/day  . Smokeless tobacco: Not on file  . Alcohol Use: No   OB History   Grav Para Term Preterm Abortions TAB SAB Ect Mult Living                 Review of Systems  Constitutional: Negative for fever, chills, diaphoresis, appetite change and fatigue.  HENT: Negative for sore throat, mouth sores and  trouble swallowing.        Sensation of right eye swelling this morning. States it is resolved now. Sensation of drooping of the right face this morning.  Eyes: Negative for visual disturbance.  Respiratory: Negative for cough, chest tightness, shortness of breath and wheezing.   Cardiovascular: Negative for chest pain.  Gastrointestinal: Negative for nausea, vomiting, abdominal pain, diarrhea and abdominal distention.  Endocrine: Negative for polydipsia, polyphagia and polyuria.  Genitourinary: Negative for dysuria, frequency and hematuria.  Musculoskeletal: Negative for gait problem.  Skin: Negative for color change, pallor and rash.  Neurological: Positive for dizziness, weakness and headaches. Negative for syncope and light-headedness.  Hematological: Does not bruise/bleed easily.  Psychiatric/Behavioral: Negative for behavioral problems and confusion.    Allergies  Albuterol and Aspirin  Home Medications   Current Outpatient Rx  Name  Route  Sig  Dispense  Refill  . atenolol (TENORMIN) 50 MG tablet   Oral   Take 100 mg by mouth daily.         Marland Kitchen atorvastatin (LIPITOR) 80 MG tablet   Oral   Take 80 mg by mouth at bedtime.          . clopidogrel (PLAVIX) 75 MG tablet   Oral   Take 75 mg by  mouth at bedtime.          . cyclobenzaprine (FLEXERIL) 10 MG tablet   Oral   Take 30 mg by mouth at bedtime.          Marland Kitchen escitalopram (LEXAPRO) 10 MG tablet   Oral   Take 20 mg by mouth at bedtime.          Marland Kitchen oxyCODONE-acetaminophen (PERCOCET) 7.5-325 MG per tablet   Oral   Take 1 tablet by mouth every 6 (six) hours as needed for pain.         . pantoprazole (PROTONIX) 20 MG tablet   Oral   Take 20 mg by mouth 2 (two) times daily.         . pregabalin (LYRICA) 150 MG capsule   Oral   Take 150 mg by mouth 3 (three) times daily.          Marland Kitchen tiZANidine (ZANAFLEX) 2 MG tablet   Oral   Take 2 mg by mouth at bedtime.          . topiramate (TOPAMAX) 100 MG  tablet   Oral   Take 100 mg by mouth at bedtime.          . traMADol (ULTRAM-ER) 200 MG 24 hr tablet   Oral   Take 200 mg by mouth daily as needed for pain.          . traZODone (DESYREL) 150 MG tablet   Oral   Take 150 mg by mouth at bedtime as needed for sleep. Takes either trazodone or zolpidem         . zolpidem (AMBIEN) 10 MG tablet   Oral   Take 10 mg by mouth at bedtime as needed for sleep. Takes either zolpidem or trazodone         . methylPREDNISolone (MEDROL DOSEPAK) 4 MG tablet      6po day 1, then decrease by 1 tab per day   21 tablet   0    BP 107/85  Pulse 71  Temp(Src) 97.8 F (36.6 C) (Oral)  Resp 16  Ht 5' 3.5" (1.613 m)  Wt 222 lb (100.699 kg)  BMI 38.7 kg/m2  SpO2 92% Physical Exam  Constitutional: She is oriented to person, place, and time. She appears well-developed and well-nourished. No distress.  HENT:  Head: Normocephalic.  Eyes: Conjunctivae are normal. Pupils are equal, round, and reactive to light. No scleral icterus.  Neck: Normal range of motion. Neck supple. No thyromegaly present.  Cardiovascular: Normal rate and regular rhythm.  Exam reveals no gallop and no friction rub.   No murmur heard. Pulmonary/Chest: Effort normal and breath sounds normal. No respiratory distress. She has no wheezes. She has no rales.  Abdominal: Soft. Bowel sounds are normal. She exhibits no distension. There is no tenderness. There is no rebound.  Musculoskeletal: Normal range of motion.  Neurological: She is alert and oriented to person, place, and time.  Her cranial nerves are symmetric. She is normal V1 through V3 sensation. Her eyes completely occlude on both sides she does have weakness to opening a higher elevation. She does have a Bell's phenomenon. She does not have lower facial droop on either side.  No pronator drift. Is able to lift legs with equal strength and symmetrically.  He has no cerebellar findings. She has no nystagmus. She has no  carotid bruits. She is in sinus rhythm.  Skin: Skin is warm and dry. No rash noted.  Psychiatric: She has  a normal mood and affect. Her behavior is normal.    ED Course  Procedures (including critical care time) Labs Review Labs Reviewed  CBC - Abnormal; Notable for the following:    RDW 15.9 (*)    All other components within normal limits  DIFFERENTIAL - Abnormal; Notable for the following:    Lymphocytes Relative 47 (*)    All other components within normal limits  COMPREHENSIVE METABOLIC PANEL - Abnormal; Notable for the following:    Albumin 3.2 (*)    Alkaline Phosphatase 126 (*)    GFR calc non Af Amer 64 (*)    GFR calc Af Amer 74 (*)    All other components within normal limits  PROTIME-INR  APTT  TROPONIN I  GLUCOSE, CAPILLARY  POCT I-STAT TROPONIN I     Imaging Review Ct Head (brain) Wo Contrast  04/18/2013   CLINICAL DATA:  Headache. Dizziness. Drooping left side of face.  EXAM: CT HEAD WITHOUT CONTRAST  TECHNIQUE: Contiguous axial images were obtained from the base of the skull through the vertex without intravenous contrast.  COMPARISON:  No priors.  FINDINGS: No acute intracranial abnormalities. Specifically, no evidence of acute intracranial hemorrhage, no definite findings of acute/subacute cerebral ischemia, no mass, mass effect, hydrocephalus or abnormal intra or extra-axial fluid collections. Visualized paranasal sinuses and mastoids are well pneumatized. No acute displaced skull fractures are identified.  IMPRESSION: 1. No acute intracranial abnormalities. 2. The appearance of the brain is normal.   Electronically Signed   By: Trudie Reed M.D.   On: 04/18/2013 19:25    MDM   1. Bell's palsy    Her exam is supple here but does show Bell's phenomenon. She does not have obvious upper or lower facial droop at this time. Should be a risk for stroke history of known coronary artery disease. Tablet upper, and lower facial drooping this morning in the face of  normal level of consciousness I think this is a diagnosis of Bell's palsy. Plans Medrol Dosepak returned her primary care physician. Drops eye patch.    Claudean Kinds, MD 04/18/13 2103

## 2013-04-18 NOTE — ED Notes (Signed)
Pt woke up at 10 am with headache, dizziness, "swelling" and "drooping" to L side of face.  Pt states drooping has gotten better since this morning but is still having headache and dizziness.  Denies weakness.  Reports numbness in L fingertips.

## 2015-12-29 ENCOUNTER — Emergency Department (HOSPITAL_COMMUNITY): Payer: Medicare HMO

## 2015-12-29 ENCOUNTER — Emergency Department (HOSPITAL_COMMUNITY)
Admission: EM | Admit: 2015-12-29 | Discharge: 2015-12-29 | Disposition: A | Discharge status: Home / Self Care | Payer: Medicare HMO | Attending: Emergency Medicine | Admitting: Emergency Medicine

## 2015-12-29 ENCOUNTER — Encounter (HOSPITAL_COMMUNITY): Payer: Self-pay | Admitting: Emergency Medicine

## 2015-12-29 DIAGNOSIS — Y939 Activity, unspecified: Secondary | ICD-10-CM | POA: Diagnosis not present

## 2015-12-29 DIAGNOSIS — Z7901 Long term (current) use of anticoagulants: Secondary | ICD-10-CM | POA: Insufficient documentation

## 2015-12-29 DIAGNOSIS — E7800 Pure hypercholesterolemia, unspecified: Secondary | ICD-10-CM | POA: Insufficient documentation

## 2015-12-29 DIAGNOSIS — S93402A Sprain of unspecified ligament of left ankle, initial encounter: Secondary | ICD-10-CM | POA: Insufficient documentation

## 2015-12-29 DIAGNOSIS — F329 Major depressive disorder, single episode, unspecified: Secondary | ICD-10-CM | POA: Diagnosis not present

## 2015-12-29 DIAGNOSIS — Y999 Unspecified external cause status: Secondary | ICD-10-CM | POA: Diagnosis not present

## 2015-12-29 DIAGNOSIS — I10 Essential (primary) hypertension: Secondary | ICD-10-CM | POA: Diagnosis not present

## 2015-12-29 DIAGNOSIS — Z79899 Other long term (current) drug therapy: Secondary | ICD-10-CM | POA: Insufficient documentation

## 2015-12-29 DIAGNOSIS — I2510 Atherosclerotic heart disease of native coronary artery without angina pectoris: Secondary | ICD-10-CM | POA: Diagnosis not present

## 2015-12-29 DIAGNOSIS — Y929 Unspecified place or not applicable: Secondary | ICD-10-CM | POA: Insufficient documentation

## 2015-12-29 DIAGNOSIS — S8992XA Unspecified injury of left lower leg, initial encounter: Secondary | ICD-10-CM | POA: Diagnosis present

## 2015-12-29 DIAGNOSIS — W01198A Fall on same level from slipping, tripping and stumbling with subsequent striking against other object, initial encounter: Secondary | ICD-10-CM | POA: Diagnosis not present

## 2015-12-29 DIAGNOSIS — F17210 Nicotine dependence, cigarettes, uncomplicated: Secondary | ICD-10-CM | POA: Diagnosis not present

## 2015-12-29 NOTE — ED Provider Notes (Signed)
CSN: [id number]     Arrival date & time 12/29/15  1119 History  By signing my name below, I, Selena Clark, attest that this documentation has been prepared under the direction and in the presence of [name], PA-C. Electronically Signed: Phillis Clark, ED Scribe. 12/29/2015. 12:23 PM.  Chief Complaint  Patient presents with  . Fall  . Leg Pain   The history is provided by the patient. No language interpreter was used.  HPI Comments: Selena Clark is a 55 y.o. female with a hx of HTN, fibromyalgia, and CAD who presents to the Emergency Department complaining of left leg pain onset one day ago. Pt reports that she got a cramp in her leg, which caused her to fall and hit her ankle on the tub and floor. Pt was given medication for her recurrent "Charlie horses" in her leg. Pt is seen at pain management in [place] and is on Oxycodone and Tramadol for her fibromyalgia. She has taken these medications for her current pain to no relief. Pt is on Plavix. She regularly ambulates with a cane. She denies hitting head, LOC, numbness, weakness, joint swelling, or wound.   Past Medical History  Diagnosis Date  . Hypertension   . Fibromyalgia   . Hypercholesteremia   . Depression   . Coronary artery disease   . Headache    Past Surgical History  Procedure Laterality Date  . Back surgery    . Cardiac surgery    . Cholecystectomy    . Hernia repair    . Tubal ligation    . Carpal tunnel release     No family history on file. Social History  Substance Use Topics  . Smoking status: Current Every Day Smoker -- 1.00 packs/day    Types: Cigarettes  . Smokeless tobacco: None  . Alcohol Use: No   OB History    No data available     Review of Systems  Musculoskeletal: Positive for arthralgias. Negative for joint swelling.  Skin: Negative for wound.  Neurological: Negative for syncope, weakness and numbness.   Allergies  Albuterol and Aspirin  Home Medications   Prior to  Admission medications   Medication Sig Start Date End Date Taking? Authorizing Provider  atenolol (TENORMIN) 50 MG tablet Take 100 mg by mouth daily.    Historical Provider, MD  atorvastatin (LIPITOR) 80 MG tablet Take 80 mg by mouth at bedtime.     Historical Provider, MD  clopidogrel (PLAVIX) 75 MG tablet Take 75 mg by mouth at bedtime.     Historical Provider, MD  cyclobenzaprine (FLEXERIL) 10 MG tablet Take 30 mg by mouth at bedtime.     Historical Provider, MD  escitalopram (LEXAPRO) 10 MG tablet Take 20 mg by mouth at bedtime.     Historical Provider, MD  methylPREDNISolone (MEDROL DOSEPAK) 4 MG tablet 6po day 1, then decrease by 1 tab per day 04/18/13   Rolland Porter, MD  oxyCODONE-acetaminophen (PERCOCET) 7.5-325 MG per tablet Take 1 tablet by mouth every 6 (six) hours as needed for pain.    Historical Provider, MD  pantoprazole (PROTONIX) 20 MG tablet Take 20 mg by mouth 2 (two) times daily.    Historical Provider, MD  pregabalin (LYRICA) 150 MG capsule Take 150 mg by mouth 3 (three) times daily.     Historical Provider, MD  tiZANidine (ZANAFLEX) 2 MG tablet Take 2 mg by mouth at bedtime.     Historical Provider, MD  topiramate (TOPAMAX) 100 MG tablet Take  100 mg by mouth at bedtime.  04/12/13   Historical Provider, MD  traMADol (ULTRAM-ER) 200 MG 24 hr tablet Take 200 mg by mouth daily as needed for pain.  04/12/13   Historical Provider, MD  traZODone (DESYREL) 150 MG tablet Take 150 mg by mouth at bedtime as needed for sleep. Takes either trazodone or zolpidem    Historical Provider, MD  zolpidem (AMBIEN) 10 MG tablet Take 10 mg by mouth at bedtime as needed for sleep. Takes either zolpidem or trazodone    Historical Provider, MD   BP 114/86 mmHg  Pulse 84  Temp(Src) 97.5 F (36.4 C) (Oral)  Resp 16  Ht 5' 2.5" (1.588 m)  Wt 237 lb (107.502 kg)  BMI 42.63 kg/m2  SpO2 95% Physical Exam  Constitutional: She is oriented to person, place, and time. She appears well-developed and  well-nourished.  HENT:  Head: Normocephalic and atraumatic.  Eyes: EOM are normal.  Neck: Normal range of motion. Neck supple.  Cardiovascular: Normal rate.   Pulmonary/Chest: Effort normal.  Musculoskeletal: Normal range of motion.  Mild swelling noted over left lateral malleolus. Normal left knee exam, no obvious swelling or deformity. Full range of motion. Negative anterior-posterior drawer signs. No tenderness to palpation over medial malleolus. Tenderness to palpation over lateral malleolus and fourth and fifth metatarsals. No pain with range of motion of any of the toes. Pain with dorsiflexion and inversion of the left ankle. DP pulse intact.  Neurological: She is alert and oriented to person, place, and time.  Skin: Skin is warm and dry.  Psychiatric: She has a normal mood and affect. Her behavior is normal.  Nursing note and vitals reviewed.   ED Course  Procedures (including critical care time) DIAGNOSTIC STUDIES: Oxygen Saturation is 95% on RA, normal by my interpretation.    COORDINATION OF CARE: 12:21 PM-Discussed treatment plan which includes x-ray with pt at bedside and pt agreed to plan.    Labs Review Labs Reviewed - No data to display  Imaging Review Dg Ankle Complete Left  12/29/2015  CLINICAL DATA:  Fall last night with left ankle and foot pain. EXAM: LEFT ANKLE COMPLETE - 3+ VIEW COMPARISON:  None. FINDINGS: Ankle mortise is within normal. There is no acute fracture or dislocation. IMPRESSION: No acute findings. Electronically Signed   By: Elberta Fortis M.D.   On: 12/29/2015 13:27   Dg Foot Complete Left  12/29/2015  CLINICAL DATA:  Status post fall last night. EXAM: LEFT FOOT - COMPLETE 3+ VIEW COMPARISON:  None. FINDINGS: There is no evidence of fracture or dislocation. There is no evidence of arthropathy or other focal bone abnormality. Soft tissues are unremarkable. IMPRESSION: Negative. Electronically Signed   By: Elige Ko   On: 12/29/2015 13:27   I have  personally reviewed and evaluated these images and lab results as part of my medical decision-making.   EKG Interpretation None      MDM   Final diagnoses:  Ankle sprain, left, initial encounter   Patient to the emergency department after falling and twisting left ankle. She reports no other injuries. X-rays were obtained of the ankle and the foot and both negative. Patient is walking with a cane and states she is able to full weight on it and walk with a cane, states she does not need crutches. ASO brace applied for support. Will have patient follow-up with a primary care doctor. Instructed to stay off of it as much as possible, keep it elevated, ice, continue  her regular pain medications.  Filed Vitals:   12/29/15 1134  BP: 114/86  Pulse: 84  Temp: 97.5 F (36.4 C)  TempSrc: Oral  Resp: 16  Height: 5' 2.5" (1.588 m)  Weight: 107.502 kg  SpO2: 95%   I personally performed the services described in this documentation, which was scribed in my presence. The recorded information has been reviewed and is accurate.    Jaynie Crumbleatyana Juanice Warburton, PA-C 12/29/15 2140  Jacalyn LefevreJulie Haviland, MD 01/01/16 [phone]

## 2015-12-29 NOTE — ED Notes (Signed)
Patient transported to X-ray 

## 2015-12-29 NOTE — ED Notes (Signed)
Pt ambulatory at d/c with cane- (baseline).  D/c with family.

## 2015-12-29 NOTE — ED Notes (Signed)
Pt states fell last night, hitting left leg on the tub and floor-- pt states she is already taking oxycodone for fibromyalgia -- goes to pain management in [place] [facility] . Pt uses a cane regularly to ambulate.

## 2015-12-29 NOTE — Discharge Instructions (Signed)
Your xrays were normal today. Keep ankle elevated when at home. Ice several times a day. Continue your current medications. Follow up with your family doctor or your pain doctor for recheck.    Ankle Sprain An ankle sprain is an injury to the strong, fibrous tissues (ligaments) that hold the bones of your ankle joint together.  CAUSES An ankle sprain is usually caused by a fall or by twisting your ankle. Ankle sprains most commonly occur when you step on the outer edge of your foot, and your ankle turns inward. People who participate in sports are more prone to these types of injuries.  SYMPTOMS   Pain in your ankle. The pain may be present at rest or only when you are trying to stand or walk.  Swelling.  Bruising. Bruising may develop immediately or within 1 to 2 days after your injury.  Difficulty standing or walking, particularly when turning corners or changing directions. DIAGNOSIS  Your caregiver will ask you details about your injury and perform a physical exam of your ankle to determine if you have an ankle sprain. During the physical exam, your caregiver will press on and apply pressure to specific areas of your foot and ankle. Your caregiver will try to move your ankle in certain ways. An X-ray exam may be done to be sure a bone was not broken or a ligament did not separate from one of the bones in your ankle (avulsion fracture).  TREATMENT  Certain types of braces can help stabilize your ankle. Your caregiver can make a recommendation for this. Your caregiver may recommend the use of medicine for pain. If your sprain is severe, your caregiver may refer you to a surgeon who helps to restore function to parts of your skeletal system (orthopedist) or a physical therapist. HOME CARE INSTRUCTIONS   Apply ice to your injury for 1-2 days or as directed by your caregiver. Applying ice helps to reduce inflammation and pain.  Put ice in a plastic bag.  Place a towel between your skin and  the bag.  Leave the ice on for 15-20 minutes at a time, every 2 hours while you are awake.  Only take over-the-counter or prescription medicines for pain, discomfort, or fever as directed by your caregiver.  Elevate your injured ankle above the level of your heart as much as possible for 2-3 days.  If your caregiver recommends crutches, use them as instructed. Gradually put weight on the affected ankle. Continue to use crutches or a cane until you can walk without feeling pain in your ankle.  If you have a plaster splint, wear the splint as directed by your caregiver. Do not rest it on anything harder than a pillow for the first 24 hours. Do not put weight on it. Do not get it wet. You may take it off to take a shower or bath.  You may have been given an elastic bandage to wear around your ankle to provide support. If the elastic bandage is too tight (you have numbness or tingling in your foot or your foot becomes cold and blue), adjust the bandage to make it comfortable.  If you have an air splint, you may blow more air into it or let air out to make it more comfortable. You may take your splint off at night and before taking a shower or bath. Wiggle your toes in the splint several times per day to decrease swelling. SEEK MEDICAL CARE IF:   You have rapidly increasing bruising  or swelling.  Your toes feel extremely cold or you lose feeling in your foot.  Your pain is not relieved with medicine. SEEK IMMEDIATE MEDICAL CARE IF:  Your toes are numb or blue.  You have severe pain that is increasing. MAKE SURE YOU:   Understand these instructions.  Will watch your condition.  Will get help right away if you are not doing well or get worse.   This information is not intended to replace advice given to you by your health care provider. Make sure you discuss any questions you have with your health care provider.   Document Released: 07/22/2005 Document Revised: 08/12/2014 Document  Reviewed: 08/03/2011 Elsevier Interactive Patient Education [employer]

## 2016-12-02 ENCOUNTER — Emergency Department (HOSPITAL_COMMUNITY)
Admission: EM | Admit: 2016-12-02 | Discharge: 2016-12-02 | Disposition: A | Discharge status: Home / Self Care | Payer: Medicare HMO | Attending: Emergency Medicine | Admitting: Emergency Medicine

## 2016-12-02 ENCOUNTER — Encounter (HOSPITAL_COMMUNITY): Payer: Self-pay | Admitting: Emergency Medicine

## 2016-12-02 ENCOUNTER — Emergency Department (HOSPITAL_COMMUNITY): Payer: Medicare HMO

## 2016-12-02 DIAGNOSIS — B349 Viral infection, unspecified: Secondary | ICD-10-CM | POA: Diagnosis not present

## 2016-12-02 DIAGNOSIS — I2510 Atherosclerotic heart disease of native coronary artery without angina pectoris: Secondary | ICD-10-CM | POA: Insufficient documentation

## 2016-12-02 DIAGNOSIS — R197 Diarrhea, unspecified: Secondary | ICD-10-CM

## 2016-12-02 DIAGNOSIS — R112 Nausea with vomiting, unspecified: Secondary | ICD-10-CM

## 2016-12-02 DIAGNOSIS — I10 Essential (primary) hypertension: Secondary | ICD-10-CM | POA: Insufficient documentation

## 2016-12-02 DIAGNOSIS — R05 Cough: Secondary | ICD-10-CM | POA: Diagnosis present

## 2016-12-02 DIAGNOSIS — F17210 Nicotine dependence, cigarettes, uncomplicated: Secondary | ICD-10-CM | POA: Insufficient documentation

## 2016-12-02 DIAGNOSIS — E860 Dehydration: Secondary | ICD-10-CM

## 2016-12-02 LAB — BASIC METABOLIC PANEL
ANION GAP: 13 (ref 5–15)
BUN: 18 mg/dL (ref 6–20)
CALCIUM: 8.8 mg/dL — AB (ref 8.9–10.3)
CO2: 18 mmol/L — ABNORMAL LOW (ref 22–32)
Chloride: 104 mmol/L (ref 101–111)
Creatinine, Ser: 1.07 mg/dL — ABNORMAL HIGH (ref 0.44–1.00)
GFR calc Af Amer: >60 mL/min (ref >60)
GFR, EST NON AFRICAN AMERICAN: 58 mL/min — AB (ref >60)
Glucose, Bld: 117 mg/dL — ABNORMAL HIGH (ref 65–99)
POTASSIUM: 3.1 mmol/L — AB (ref 3.5–5.1)
SODIUM: 135 mmol/L (ref 135–145)

## 2016-12-02 LAB — CBC WITH DIFFERENTIAL/PLATELET
BASOS ABS: 0 10*3/uL (ref 0.0–0.1)
BASOS PCT: 0 %
EOS PCT: 2 %
Eosinophils Absolute: 0.1 10*3/uL (ref 0.0–0.7)
HCT: 47.4 % — ABNORMAL HIGH (ref 36.0–46.0)
Hemoglobin: 15.7 g/dL — ABNORMAL HIGH (ref 12.0–15.0)
LYMPHS PCT: 39 %
Lymphs Abs: 2.4 10*3/uL (ref 0.7–4.0)
MCH: 30.1 pg (ref 26.0–34.0)
MCHC: 33.1 g/dL (ref 30.0–36.0)
MCV: 90.8 fL (ref 78.0–100.0)
MONO ABS: 0.5 10*3/uL (ref 0.1–1.0)
Monocytes Relative: 8 %
Neutro Abs: 3 10*3/uL (ref 1.7–7.7)
Neutrophils Relative %: 51 %
PLATELETS: 180 10*3/uL (ref 150–400)
RBC: 5.22 MIL/uL — ABNORMAL HIGH (ref 3.87–5.11)
RDW: 15.4 % (ref 11.5–15.5)
WBC: 6 10*3/uL (ref 4.0–10.5)

## 2016-12-02 MED ORDER — HYDROCOD POLST-CPM POLST ER 10-8 MG/5ML PO SUER
5.0000 mL | Freq: Once | ORAL | Status: AC
  Administered 2016-12-02: 5 mL via ORAL
  Filled 2016-12-02: qty 5

## 2016-12-02 MED ORDER — ONDANSETRON HCL 4 MG PO TABS: 4.0000 mg | ORAL_TABLET | Freq: Three times a day (TID) | ORAL | 0 refills | Status: DC | PRN

## 2016-12-02 MED ORDER — SODIUM CHLORIDE 0.9 % IV BOLUS (SEPSIS)
1000.0000 mL | Freq: Once | INTRAVENOUS | Status: AC
  Administered 2016-12-02: 1000 mL via INTRAVENOUS

## 2016-12-02 MED ORDER — HYDROCOD POLST-CPM POLST ER 10-8 MG/5ML PO SUER: 5.0000 mL | Freq: Two times a day (BID) | ORAL | 0 refills | Status: DC | PRN

## 2016-12-02 NOTE — ED Triage Notes (Signed)
Pt c/o cough, congestion, n/v/d since returned via plane from [place] 5 days ago. No one else from trip sick.

## 2016-12-02 NOTE — ED Notes (Signed)
Pt given ginger ale.

## 2016-12-02 NOTE — Discharge Instructions (Signed)
Read the information below.  Use the prescribed medication as directed.  Please discuss all new medications with your pharmacist.  You may return to the Emergency Department at any time for worsening condition or any new symptoms that concern you.  If you develop high fevers that do not resolve with tylenol or ibuprofen, you have difficulty swallowing or breathing, or you are unable to tolerate fluids by mouth, return to the ER for a recheck.    °

## 2016-12-02 NOTE — ED Provider Notes (Signed)
[facility]-EMERGENCY DEPT Provider Note   CSN: [id number] Arrival date & time: 12/02/16  0109     History   Chief Complaint Chief Complaint  Patient presents with  . Emesis    HPI Selena Clark is a 56 y.o. female.  HPI   Pt with hx CAD, HTN, HLD, fibromyalgia p/w 5 days of rhinorrhea, sore throat, cough productive of white foam, SOB, fever to 101, N/V/D.  Denies CP or leg swelling.  Recently travelled by airplane and became sick upon her return.  Her significant other is now sick with similar symptoms.  Has been taking robitussin and tessalon perles without improvement.  Difficulty sleeping at night due to increased coughing with lying down. She is a smoker.    Past Medical History:  Diagnosis Date  . Coronary artery disease   . Depression   . Fibromyalgia   . Headache   . Hypercholesteremia   . Hypertension     There are no active problems to display for this patient.   Past Surgical History:  Procedure Laterality Date  . BACK SURGERY    . CARDIAC SURGERY    . CARPAL TUNNEL RELEASE    . CHOLECYSTECTOMY    . HERNIA REPAIR    . TUBAL LIGATION      OB History    No data available       Home Medications    Prior to Admission medications   Medication Sig Start Date End Date Taking? Authorizing Provider  atenolol (TENORMIN) 50 MG tablet Take 100 mg by mouth daily.    Historical Provider, MD  atorvastatin (LIPITOR) 80 MG tablet Take 80 mg by mouth at bedtime.     Historical Provider, MD  chlorpheniramine-HYDROcodone (TUSSIONEX PENNKINETIC ER) 10-8 MG/5ML SUER Take 5 mLs by mouth every 12 (twelve) hours as needed for cough. 12/02/16   Trixie Dredge, PA-C  clopidogrel (PLAVIX) 75 MG tablet Take 75 mg by mouth at bedtime.     Historical Provider, MD  cyclobenzaprine (FLEXERIL) 10 MG tablet Take 30 mg by mouth at bedtime.     Historical Provider, MD  escitalopram (LEXAPRO) 10 MG tablet Take 20 mg by mouth at bedtime.     Historical Provider, MD  methylPREDNISolone (MEDROL  DOSEPAK) 4 MG tablet 6po day 1, then decrease by 1 tab per day 04/18/13   Rolland Porter, MD  ondansetron (ZOFRAN) 4 MG tablet Take 1 tablet (4 mg total) by mouth every 8 (eight) hours as needed for nausea or vomiting. 12/02/16   Trixie Dredge, PA-C  oxyCODONE-acetaminophen (PERCOCET) 7.5-325 MG per tablet Take 1 tablet by mouth every 6 (six) hours as needed for pain.    Historical Provider, MD  pantoprazole (PROTONIX) 20 MG tablet Take 20 mg by mouth 2 (two) times daily.    Historical Provider, MD  pregabalin (LYRICA) 150 MG capsule Take 150 mg by mouth 3 (three) times daily.     Historical Provider, MD  tiZANidine (ZANAFLEX) 2 MG tablet Take 2 mg by mouth at bedtime.     Historical Provider, MD  topiramate (TOPAMAX) 100 MG tablet Take 100 mg by mouth at bedtime.  04/12/13   Historical Provider, MD  traMADol (ULTRAM-ER) 200 MG 24 hr tablet Take 200 mg by mouth daily as needed for pain.  04/12/13   Historical Provider, MD  traZODone (DESYREL) 150 MG tablet Take 150 mg by mouth at bedtime as needed for sleep. Takes either trazodone or zolpidem    Historical Provider, MD  zolpidem (AMBIEN)  10 MG tablet Take 10 mg by mouth at bedtime as needed for sleep. Takes either zolpidem or trazodone    Historical Provider, MD    Family History No family history on file.  Social History Social History  Substance Use Topics  . Smoking status: Current Every Day Smoker    Packs/day: 1.00    Types: Cigarettes  . Smokeless tobacco: Never Used  . Alcohol use No     Allergies   Albuterol and Aspirin   Review of Systems Review of Systems  All other systems reviewed and are negative.    Physical Exam Updated Vital Signs BP (!) 147/92   Pulse 91   Temp 98.5 F (36.9 C) (Oral)   Resp 16   Ht  (1.575 m)   Wt 107 kg   SpO2 96%   BMI 43.16 kg/m   Physical Exam  Constitutional: She appears well-developed and well-nourished. No distress.  HENT:  Head: Normocephalic and atraumatic.  Neck: Neck supple.    Cardiovascular: Normal rate and regular rhythm.   Pulmonary/Chest: Effort normal and breath sounds normal. No respiratory distress. She has no wheezes. She has no rales.  Harsh cough.  Slight wheeze that cleared with coughing.    Abdominal: Soft. She exhibits no distension. There is tenderness (diffuse ). There is no rebound and no guarding.  Neurological: She is alert.  Skin: She is not diaphoretic.  Nursing note and vitals reviewed.    ED Treatments / Results  Labs (all labs ordered are listed, but only abnormal results are displayed) Labs Reviewed  BASIC METABOLIC PANEL - Abnormal; Notable for the following:       Result Value   Potassium 3.1 (*)    CO2 18 (*)    Glucose, Bld 117 (*)    Creatinine, Ser 1.07 (*)    Calcium 8.8 (*)    GFR calc non Af Amer 58 (*)    All other components within normal limits  CBC WITH DIFFERENTIAL/PLATELET - Abnormal; Notable for the following:    RBC 5.22 (*)    Hemoglobin 15.7 (*)    HCT 47.4 (*)    All other components within normal limits    EKG  EKG Interpretation None       Radiology Dg Chest 2 View  Result Date: 12/02/2016 CLINICAL DATA:  Chest pain fever and cough for 5 days EXAM: CHEST  2 VIEW COMPARISON:  01/31/2010 FINDINGS: The lungs are clear. The pulmonary vasculature is normal. Heart size is normal. Hilar and mediastinal contours are unremarkable. There is no pleural effusion. IMPRESSION: No active cardiopulmonary disease. Electronically Signed   By: Ellery Plunk M.D.   On: 12/02/2016 02:36    Procedures Procedures (including critical care time)  Medications Ordered in ED Medications  sodium chloride 0.9 % bolus 1,000 mL (0 mLs Intravenous Stopped 12/02/16 0410)  chlorpheniramine-HYDROcodone (TUSSIONEX) 10-8 MG/5ML suspension 5 mL (5 mLs Oral Given 12/02/16 0223)     Initial Impression / Assessment and Plan / ED Course  I have reviewed the triage vital signs and the nursing notes.  Pertinent labs & imaging  results that were available during my care of the patient were reviewed by me and considered in my medical decision making (see chart for details).  Clinical Course as of Dec 03 511  Mon Dec 02, 2016  0404 Pt does not want to stay for remainder of IVF.  She is tolerating PO.  I have advised her to push PO fluids at  home.    [EW]    Clinical Course User Index [EW] Trixie Dredge, PA-C   Declined neb treatment as she is allergic to albuterol.   Afebrile, nontoxic patient with constellation of symptoms suggestive of viral syndrome. Lungs CTAB, abdominal exam benign.  CXR negative.  Labs consistent with dehydration, IVF given, PO fluids given.  Pt tolerating PO.  Discharged home with supportive care, PCP follow up.  Discussed result, findings, treatment, and follow up  with patient.  Pt given return precautions.  Pt verbalizes understanding and agrees with plan.     Final Clinical Impressions(s) / ED Diagnoses   Final diagnoses:  Viral syndrome  Dehydration  Nausea vomiting and diarrhea    New Prescriptions Discharge Medication List as of 12/02/2016  4:07 AM    START taking these medications   Details  chlorpheniramine-HYDROcodone (TUSSIONEX PENNKINETIC ER) 10-8 MG/5ML SUER Take 5 mLs by mouth every 12 (twelve) hours as needed for cough., Starting Mon 12/02/2016, Print    ondansetron (ZOFRAN) 4 MG tablet Take 1 tablet (4 mg total) by mouth every 8 (eight) hours as needed for nausea or vomiting., Starting Mon 12/02/2016, Print         [place], [place] 12/02/16 1610    Zadie Rhine, MD 12/03/16 0126

## 2019-12-10 ENCOUNTER — Emergency Department (HOSPITAL_COMMUNITY)
Admission: EM | Admit: 2019-12-10 | Discharge: 2019-12-10 | Disposition: A | Discharge status: Home / Self Care | Payer: Medicare HMO | Attending: Emergency Medicine | Admitting: Emergency Medicine

## 2019-12-10 ENCOUNTER — Emergency Department (HOSPITAL_COMMUNITY): Payer: Medicare HMO

## 2019-12-10 ENCOUNTER — Encounter (HOSPITAL_COMMUNITY): Payer: Self-pay | Admitting: Emergency Medicine

## 2019-12-10 DIAGNOSIS — M79602 Pain in left arm: Secondary | ICD-10-CM | POA: Diagnosis not present

## 2019-12-10 DIAGNOSIS — Z5321 Procedure and treatment not carried out due to patient leaving prior to being seen by health care provider: Secondary | ICD-10-CM | POA: Insufficient documentation

## 2019-12-10 DIAGNOSIS — R202 Paresthesia of skin: Secondary | ICD-10-CM | POA: Diagnosis not present

## 2019-12-10 LAB — CBC
HCT: 49.3 % — ABNORMAL HIGH (ref 36.0–46.0)
Hemoglobin: 15.4 g/dL — ABNORMAL HIGH (ref 12.0–15.0)
MCH: 30 pg (ref 26.0–34.0)
MCHC: 31.2 g/dL (ref 30.0–36.0)
MCV: 95.9 fL (ref 80.0–100.0)
Platelets: 148 10*3/uL — ABNORMAL LOW (ref 150–400)
RBC: 5.14 MIL/uL — ABNORMAL HIGH (ref 3.87–5.11)
RDW: 14.8 % (ref 11.5–15.5)
WBC: 5.5 10*3/uL (ref 4.0–10.5)
nRBC: 0 % (ref 0.0–0.2)

## 2019-12-10 LAB — BASIC METABOLIC PANEL
Anion gap: 12 (ref 5–15)
BUN: 9 mg/dL (ref 6–20)
CO2: 26 mmol/L (ref 22–32)
Calcium: 8.9 mg/dL (ref 8.9–10.3)
Chloride: 101 mmol/L (ref 98–111)
Creatinine, Ser: 1.11 mg/dL — ABNORMAL HIGH (ref 0.44–1.00)
GFR calc Af Amer: >60 mL/min (ref >60)
GFR calc non Af Amer: 55 mL/min — ABNORMAL LOW (ref >60)
Glucose, Bld: 122 mg/dL — ABNORMAL HIGH (ref 70–99)
Potassium: 3.3 mmol/L — ABNORMAL LOW (ref 3.5–5.1)
Sodium: 139 mmol/L (ref 135–145)

## 2019-12-10 LAB — TROPONIN I (HIGH SENSITIVITY): Troponin I (High Sensitivity): 3 ng/L (ref <18)

## 2019-12-10 LAB — I-STAT BETA HCG BLOOD, ED (MC, WL, AP ONLY): I-stat hCG, quantitative: <5 m[IU]/mL (ref <5)

## 2019-12-10 MED ORDER — SODIUM CHLORIDE 0.9% FLUSH: 3.0000 mL | Freq: Once | INTRAVENOUS | Status: DC

## 2019-12-10 NOTE — ED Triage Notes (Signed)
Pt here from home with c/o left arm pain along with some tingling in the hands ,

## 2020-03-22 ENCOUNTER — Observation Stay (HOSPITAL_COMMUNITY): Payer: Medicare HMO

## 2020-03-22 ENCOUNTER — Emergency Department (HOSPITAL_COMMUNITY): Payer: Medicare HMO

## 2020-03-22 ENCOUNTER — Inpatient Hospital Stay (HOSPITAL_COMMUNITY)
Admission: EM | Admit: 2020-03-22 | Discharge: 2020-03-25 | DRG: 175 | Disposition: A | Discharge status: Home Health | Payer: Medicare HMO | Attending: Family Medicine | Admitting: Family Medicine

## 2020-03-22 ENCOUNTER — Other Ambulatory Visit: Payer: Self-pay

## 2020-03-22 ENCOUNTER — Encounter (HOSPITAL_COMMUNITY): Payer: Self-pay | Admitting: Family Medicine

## 2020-03-22 DIAGNOSIS — I2694 Multiple subsegmental pulmonary emboli without acute cor pulmonale: Principal | ICD-10-CM | POA: Diagnosis present

## 2020-03-22 DIAGNOSIS — Z951 Presence of aortocoronary bypass graft: Secondary | ICD-10-CM

## 2020-03-22 DIAGNOSIS — K589 Irritable bowel syndrome without diarrhea: Secondary | ICD-10-CM | POA: Diagnosis present

## 2020-03-22 DIAGNOSIS — R7989 Other specified abnormal findings of blood chemistry: Secondary | ICD-10-CM

## 2020-03-22 DIAGNOSIS — M899 Disorder of bone, unspecified: Secondary | ICD-10-CM

## 2020-03-22 DIAGNOSIS — W1839XA Other fall on same level, initial encounter: Secondary | ICD-10-CM | POA: Diagnosis present

## 2020-03-22 DIAGNOSIS — S0101XA Laceration without foreign body of scalp, initial encounter: Secondary | ICD-10-CM | POA: Diagnosis present

## 2020-03-22 DIAGNOSIS — E860 Dehydration: Secondary | ICD-10-CM | POA: Diagnosis present

## 2020-03-22 DIAGNOSIS — Z79899 Other long term (current) drug therapy: Secondary | ICD-10-CM

## 2020-03-22 DIAGNOSIS — N179 Acute kidney failure, unspecified: Secondary | ICD-10-CM | POA: Diagnosis not present

## 2020-03-22 DIAGNOSIS — S0191XA Laceration without foreign body of unspecified part of head, initial encounter: Secondary | ICD-10-CM | POA: Diagnosis present

## 2020-03-22 DIAGNOSIS — R55 Syncope and collapse: Secondary | ICD-10-CM

## 2020-03-22 DIAGNOSIS — E785 Hyperlipidemia, unspecified: Secondary | ICD-10-CM | POA: Diagnosis present

## 2020-03-22 DIAGNOSIS — J9601 Acute respiratory failure with hypoxia: Secondary | ICD-10-CM

## 2020-03-22 DIAGNOSIS — Z8249 Family history of ischemic heart disease and other diseases of the circulatory system: Secondary | ICD-10-CM

## 2020-03-22 DIAGNOSIS — R778 Other specified abnormalities of plasma proteins: Secondary | ICD-10-CM | POA: Diagnosis not present

## 2020-03-22 DIAGNOSIS — F419 Anxiety disorder, unspecified: Secondary | ICD-10-CM | POA: Diagnosis present

## 2020-03-22 DIAGNOSIS — Z7902 Long term (current) use of antithrombotics/antiplatelets: Secondary | ICD-10-CM

## 2020-03-22 DIAGNOSIS — I2510 Atherosclerotic heart disease of native coronary artery without angina pectoris: Secondary | ICD-10-CM | POA: Diagnosis present

## 2020-03-22 DIAGNOSIS — I129 Hypertensive chronic kidney disease with stage 1 through stage 4 chronic kidney disease, or unspecified chronic kidney disease: Secondary | ICD-10-CM | POA: Diagnosis present

## 2020-03-22 DIAGNOSIS — Z20822 Contact with and (suspected) exposure to covid-19: Secondary | ICD-10-CM | POA: Diagnosis present

## 2020-03-22 DIAGNOSIS — G9341 Metabolic encephalopathy: Secondary | ICD-10-CM | POA: Diagnosis present

## 2020-03-22 DIAGNOSIS — N1831 Chronic kidney disease, stage 3a: Secondary | ICD-10-CM | POA: Diagnosis present

## 2020-03-22 DIAGNOSIS — Z9181 History of falling: Secondary | ICD-10-CM

## 2020-03-22 DIAGNOSIS — W19XXXA Unspecified fall, initial encounter: Secondary | ICD-10-CM

## 2020-03-22 DIAGNOSIS — I951 Orthostatic hypotension: Secondary | ICD-10-CM | POA: Diagnosis present

## 2020-03-22 DIAGNOSIS — E872 Acidosis: Secondary | ICD-10-CM | POA: Diagnosis present

## 2020-03-22 DIAGNOSIS — F17210 Nicotine dependence, cigarettes, uncomplicated: Secondary | ICD-10-CM | POA: Diagnosis present

## 2020-03-22 DIAGNOSIS — D696 Thrombocytopenia, unspecified: Secondary | ICD-10-CM | POA: Diagnosis present

## 2020-03-22 DIAGNOSIS — I2699 Other pulmonary embolism without acute cor pulmonale: Secondary | ICD-10-CM | POA: Diagnosis not present

## 2020-03-22 DIAGNOSIS — Z86711 Personal history of pulmonary embolism: Secondary | ICD-10-CM | POA: Diagnosis present

## 2020-03-22 DIAGNOSIS — Y92002 Bathroom of unspecified non-institutional (private) residence single-family (private) house as the place of occurrence of the external cause: Secondary | ICD-10-CM

## 2020-03-22 HISTORY — DX: Presence of aortocoronary bypass graft: Z95.1

## 2020-03-22 HISTORY — DX: Atherosclerotic heart disease of native coronary artery without angina pectoris: I25.10

## 2020-03-22 HISTORY — DX: Essential (primary) hypertension: I10

## 2020-03-22 LAB — I-STAT CHEM 8, ED
BUN: 13 mg/dL (ref 6–20)
Calcium, Ion: 1.17 mmol/L (ref 1.15–1.40)
Chloride: 104 mmol/L (ref 98–111)
Creatinine, Ser: 1.1 mg/dL — ABNORMAL HIGH (ref 0.44–1.00)
Glucose, Bld: 96 mg/dL (ref 70–99)
HCT: 49 % — ABNORMAL HIGH (ref 36.0–46.0)
Hemoglobin: 16.7 g/dL — ABNORMAL HIGH (ref 12.0–15.0)
Potassium: 4.1 mmol/L (ref 3.5–5.1)
Sodium: 144 mmol/L (ref 135–145)
TCO2: 26 mmol/L (ref 22–32)

## 2020-03-22 LAB — I-STAT BETA HCG BLOOD, ED (MC, WL, AP ONLY): I-stat hCG, quantitative: <5 m[IU]/mL (ref <5)

## 2020-03-22 LAB — PROTIME-INR
INR: 1 (ref 0.8–1.2)
Prothrombin Time: 12.5 seconds (ref 11.4–15.2)

## 2020-03-22 LAB — CBC
HCT: 50.2 % — ABNORMAL HIGH (ref 36.0–46.0)
Hemoglobin: 15 g/dL (ref 12.0–15.0)
MCH: 30.5 pg (ref 26.0–34.0)
MCHC: 29.9 g/dL — ABNORMAL LOW (ref 30.0–36.0)
MCV: 102.2 fL — ABNORMAL HIGH (ref 80.0–100.0)
Platelets: 127 10*3/uL — ABNORMAL LOW (ref 150–400)
RBC: 4.91 MIL/uL (ref 3.87–5.11)
RDW: 17.5 % — ABNORMAL HIGH (ref 11.5–15.5)
WBC: 5.6 10*3/uL (ref 4.0–10.5)
nRBC: 0 % (ref 0.0–0.2)

## 2020-03-22 LAB — URINALYSIS, ROUTINE W REFLEX MICROSCOPIC
Bilirubin Urine: NEGATIVE
Glucose, UA: NEGATIVE mg/dL
Hgb urine dipstick: NEGATIVE
Ketones, ur: NEGATIVE mg/dL
Leukocytes,Ua: NEGATIVE
Nitrite: NEGATIVE
Protein, ur: NEGATIVE mg/dL
Specific Gravity, Urine: >1.046 — ABNORMAL HIGH (ref 1.005–1.030)
pH: 5 (ref 5.0–8.0)

## 2020-03-22 LAB — ETHANOL: Alcohol, Ethyl (B): <10 mg/dL (ref <10)

## 2020-03-22 LAB — HEMOGLOBIN A1C
Hgb A1c MFr Bld: 5.8 % — ABNORMAL HIGH (ref 4.8–5.6)
Mean Plasma Glucose: 119.76 mg/dL

## 2020-03-22 LAB — COMPREHENSIVE METABOLIC PANEL
ALT: 9 U/L (ref 0–44)
AST: 18 U/L (ref 15–41)
Albumin: 2.7 g/dL — ABNORMAL LOW (ref 3.5–5.0)
Alkaline Phosphatase: 68 U/L (ref 38–126)
Anion gap: 8 (ref 5–15)
BUN: 10 mg/dL (ref 6–20)
CO2: 27 mmol/L (ref 22–32)
Calcium: 9 mg/dL (ref 8.9–10.3)
Chloride: 107 mmol/L (ref 98–111)
Creatinine, Ser: 1.12 mg/dL — ABNORMAL HIGH (ref 0.44–1.00)
GFR calc Af Amer: >60 mL/min (ref >60)
GFR calc non Af Amer: 54 mL/min — ABNORMAL LOW (ref >60)
Glucose, Bld: 102 mg/dL — ABNORMAL HIGH (ref 70–99)
Potassium: 4.4 mmol/L (ref 3.5–5.1)
Sodium: 142 mmol/L (ref 135–145)
Total Bilirubin: 0.3 mg/dL (ref 0.3–1.2)
Total Protein: 5.5 g/dL — ABNORMAL LOW (ref 6.5–8.1)

## 2020-03-22 LAB — LACTIC ACID, PLASMA: Lactic Acid, Venous: 2.6 mmol/L (ref 0.5–1.9)

## 2020-03-22 LAB — TROPONIN I (HIGH SENSITIVITY): Troponin I (High Sensitivity): 180 ng/L (ref <18)

## 2020-03-22 LAB — SAMPLE TO BLOOD BANK

## 2020-03-22 LAB — HEPARIN LEVEL (UNFRACTIONATED): Heparin Unfractionated: 0.55 IU/mL (ref 0.30–0.70)

## 2020-03-22 LAB — HIV ANTIBODY (ROUTINE TESTING W REFLEX): HIV Screen 4th Generation wRfx: NONREACTIVE

## 2020-03-22 LAB — CBG MONITORING, ED: Glucose-Capillary: 94 mg/dL (ref 70–99)

## 2020-03-22 LAB — SARS CORONAVIRUS 2 BY RT PCR (HOSPITAL ORDER, PERFORMED IN ~~LOC~~ HOSPITAL LAB): SARS Coronavirus 2: NEGATIVE

## 2020-03-22 LAB — TSH: TSH: 0.744 u[IU]/mL (ref 0.350–4.500)

## 2020-03-22 MED ORDER — SODIUM CHLORIDE 0.9 % IV BOLUS
1000.0000 mL | Freq: Once | INTRAVENOUS | Status: AC
  Administered 2020-03-22: 1000 mL via INTRAVENOUS

## 2020-03-22 MED ORDER — PANTOPRAZOLE SODIUM 40 MG PO TBEC
40.0000 mg | DELAYED_RELEASE_TABLET | Freq: Every day | ORAL | Status: DC
  Administered 2020-03-22 – 2020-03-25 (×4): 40 mg via ORAL
  Filled 2020-03-22 (×4): qty 1

## 2020-03-22 MED ORDER — TOPIRAMATE 25 MG PO TABS
100.0000 mg | ORAL_TABLET | Freq: Every day | ORAL | Status: DC
  Administered 2020-03-22 – 2020-03-25 (×4): 100 mg via ORAL
  Filled 2020-03-22 (×4): qty 4

## 2020-03-22 MED ORDER — DICYCLOMINE HCL 20 MG PO TABS
20.0000 mg | ORAL_TABLET | Freq: Three times a day (TID) | ORAL | Status: DC
  Administered 2020-03-22 – 2020-03-25 (×9): 20 mg via ORAL
  Filled 2020-03-22 (×11): qty 1

## 2020-03-22 MED ORDER — GABAPENTIN 400 MG PO CAPS
400.0000 mg | ORAL_CAPSULE | Freq: Every day | ORAL | Status: DC
  Administered 2020-03-22 – 2020-03-24 (×3): 400 mg via ORAL
  Filled 2020-03-22 (×3): qty 1

## 2020-03-22 MED ORDER — ACETAMINOPHEN 650 MG RE SUPP: 650.0000 mg | Freq: Four times a day (QID) | RECTAL | Status: DC | PRN

## 2020-03-22 MED ORDER — IOHEXOL 350 MG/ML SOLN
75.0000 mL | Freq: Once | INTRAVENOUS | Status: AC | PRN
  Administered 2020-03-22: 75 mL via INTRAVENOUS

## 2020-03-22 MED ORDER — METOPROLOL TARTRATE 25 MG PO TABS
25.0000 mg | ORAL_TABLET | Freq: Two times a day (BID) | ORAL | Status: DC
  Administered 2020-03-22 – 2020-03-25 (×6): 25 mg via ORAL
  Filled 2020-03-22 (×6): qty 1

## 2020-03-22 MED ORDER — ACETAMINOPHEN 325 MG PO TABS
650.0000 mg | ORAL_TABLET | Freq: Four times a day (QID) | ORAL | Status: DC | PRN
  Administered 2020-03-22 – 2020-03-23 (×2): 650 mg via ORAL
  Filled 2020-03-22 (×2): qty 2

## 2020-03-22 MED ORDER — BACITRACIN ZINC 500 UNIT/GM EX OINT
TOPICAL_OINTMENT | Freq: Two times a day (BID) | CUTANEOUS | Status: DC
  Filled 2020-03-22: qty 28.4

## 2020-03-22 MED ORDER — ESCITALOPRAM OXALATE 10 MG PO TABS
20.0000 mg | ORAL_TABLET | Freq: Every day | ORAL | Status: DC
  Administered 2020-03-22 – 2020-03-25 (×4): 20 mg via ORAL
  Filled 2020-03-22 (×4): qty 2

## 2020-03-22 MED ORDER — ROSUVASTATIN CALCIUM 20 MG PO TABS
40.0000 mg | ORAL_TABLET | Freq: Every day | ORAL | Status: DC
  Administered 2020-03-22 – 2020-03-25 (×4): 40 mg via ORAL
  Filled 2020-03-22: qty 2
  Filled 2020-03-22: qty 8
  Filled 2020-03-22: qty 2
  Filled 2020-03-22: qty 8

## 2020-03-22 MED ORDER — LIDOCAINE-EPINEPHRINE-TETRACAINE (LET) TOPICAL GEL
3.0000 mL | Freq: Once | TOPICAL | Status: AC
  Administered 2020-03-22: 3 mL via TOPICAL
  Filled 2020-03-22: qty 3

## 2020-03-22 MED ORDER — ONDANSETRON HCL 4 MG PO TABS: 4.0000 mg | ORAL_TABLET | Freq: Four times a day (QID) | ORAL | Status: DC | PRN

## 2020-03-22 MED ORDER — TRAZODONE HCL 100 MG PO TABS
200.0000 mg | ORAL_TABLET | Freq: Every evening | ORAL | Status: DC | PRN
  Administered 2020-03-22: 200 mg via ORAL
  Filled 2020-03-22: qty 4

## 2020-03-22 MED ORDER — CLOPIDOGREL BISULFATE 75 MG PO TABS
75.0000 mg | ORAL_TABLET | Freq: Every day | ORAL | Status: DC
  Administered 2020-03-22 – 2020-03-23 (×2): 75 mg via ORAL
  Filled 2020-03-22 (×3): qty 1

## 2020-03-22 MED ORDER — ONDANSETRON HCL 4 MG/2ML IJ SOLN
4.0000 mg | Freq: Four times a day (QID) | INTRAMUSCULAR | Status: DC | PRN
  Filled 2020-03-22: qty 2

## 2020-03-22 MED ORDER — HEPARIN (PORCINE) 25000 UT/250ML-% IV SOLN
850.0000 [IU]/h | INTRAVENOUS | Status: DC
  Administered 2020-03-22: 1100 [IU]/h via INTRAVENOUS
  Administered 2020-03-23: 850 [IU]/h via INTRAVENOUS
  Filled 2020-03-22 (×2): qty 250

## 2020-03-22 MED ORDER — HEPARIN BOLUS VIA INFUSION
3000.0000 [IU] | Freq: Once | INTRAVENOUS | Status: AC
  Administered 2020-03-22: 3000 [IU] via INTRAVENOUS
  Filled 2020-03-22: qty 3000

## 2020-03-22 MED ORDER — SODIUM CHLORIDE 0.9 % IV SOLN: INTRAVENOUS | Status: DC

## 2020-03-22 NOTE — H&P (Addendum)
History and Physical    Selena Clark WGN:[id number] DOB: 25-Nov-1960 DOA: 03/22/2020  PCP: Kateri Mc, MD  Patient coming from: Home  I have personally briefly reviewed patient's old medical records in [place]  Chief Complaint: Passed out  HPI: Selena Clark is a 59 y.o. female with medical history significant of CAD s/p open heart surgery on Plavix, IBS, anxiety, hypertension and hyperlipidemia was brought into the ED due to concern of passing out.  History was obtained from the ED physician as well as husband who was sitting at the bedside.  According to husband, patient was in the bathroom having a bowel movement and right she was washing her hands at the sink, she suddenly passed out.  He was in the room and heard the noise so he attended her.  He found her on the floor.  She was unconscious however soon within a minute, she regained her consciousness but was confused.  EMS was called.  She was brought into the ED.  According to patient, she did not have any prodromal symptoms prior to this episode such as chest pain, dizziness, shortness of breath, nausea, vomiting, headache or any other complaint.  According to the husband, she keeps falling and falls approximately twice a month but has never sought any medical attention before.  He thinks that she is overmedicated.  ED Course: Upon arrival to ED, she was hypoxic with oxygen saturation in 50s.  Required nonrebreather and soon was transitioned to nasal cannula and currently on 4 L of nasal cannula.  Due to this, CT angiogram of the chest was done which ruled out large PE however was found to have 2 small subsegmental right lower lobe PEs.  Extensive imaging studies including chest x-ray, pelvic x-ray, CT cervical spine and CT head were all unremarkable.  She also had elevated creatinine of 1.12.  We do not have any prior labs to compare with.  Likely AKI.  Slightly elevated lactic acid of 2.6.  Troponin elevated at 180.  Also  has thrombocytopenia with platelets of 127.  Upon my evaluation, patient is alert however she remains confused.  She knows she is in the hospital, she was able to tell me her name, her date of birth and her husband's name however she could not tell me the year and the month.  She kept repeating herself.  Review of Systems: As per HPI otherwise negative.   She has past medical history of CAD, hypertension, hyperlipidemia, IBS and anxiety  History of CABG.  Denies ever using tobacco.  Drinks alcohol socially.  No Known Allergies  History reviewed. No pertinent family history.  Prior to Admission medications   Medication Sig Start Date End Date Taking? Authorizing Provider  clopidogrel (PLAVIX) 75 MG tablet Take 75 mg by mouth daily. 03/06/20  Yes [provider]  dicyclomine (BENTYL) 20 MG tablet Take 20 mg by mouth 3 (three) times daily. 02/20/20  Yes [provider]  escitalopram (LEXAPRO) 20 MG tablet Take 20 mg by mouth daily. 03/22/20  Yes [provider]  gabapentin (NEURONTIN) 400 MG capsule Take 400 mg by mouth in the morning, at noon, and at bedtime. 02/29/20  Yes [provider]  metoprolol tartrate (LOPRESSOR) 25 MG tablet Take 25 mg by mouth 2 (two) times daily. 03/16/20  Yes [provider]  pantoprazole (PROTONIX) 40 MG tablet Take 40 mg by mouth daily. 02/23/20  Yes [provider]  rosuvastatin (CRESTOR) 40 MG tablet Take 40  mg by mouth daily. 01/25/20  Yes [provider]  tiZANidine (ZANAFLEX) 4 MG tablet Take 4 mg by mouth 3 (three) times daily. 02/20/20  Yes [provider]  topiramate (TOPAMAX) 100 MG tablet Take 100 mg by mouth daily. 03/07/20  Yes [provider]  traZODone (DESYREL) 100 MG tablet Take 200 mg by mouth at bedtime as needed for sleep. 03/02/20  Yes [provider]  XTAMPZA ER 13.5 MG C12A Take 1 capsule by mouth 2 (two) times daily. 10/08/19  Yes [provider]    XTAMPZA ER 18 MG C12A Take 1 capsule by mouth 2 (two) times daily. 03/20/20  Yes [provider]    Physical Exam: Vitals:   03/22/20 1452 03/22/20 1515 03/22/20 1601 03/22/20 1650  BP:  112/76 (!) 141/77   Pulse: (!) 111 (!) 107 (!) 105   Resp: (!) 29  19   Temp:      TempSrc:      SpO2: 100% 95% 97%   Weight:    79.4 kg  Height:    '5\' 2"'$  (1.575 m)    Constitutional: NAD, calm, comfortable Vitals:   03/22/20 1452 03/22/20 1515 03/22/20 1601 03/22/20 1650  BP:  112/76 (!) 141/77   Pulse: (!) 111 (!) 107 (!) 105   Resp: (!) 29  19   Temp:      TempSrc:      SpO2: 100% 95% 97%   Weight:    79.4 kg  Height:    '5\' 2"'$  (1.575 m)   Eyes: PERRL, lids and conjunctivae normal ENMT: Mucous membranes are dry. Posterior pharynx clear of any exudate or lesions.Normal dentition.  Neck: normal, supple, no masses, no thyromegaly Respiratory: clear to auscultation bilaterally, no wheezing, no crackles. Normal respiratory effort. No accessory muscle use.  Cardiovascular: Regular rate and rhythm, no murmurs / rubs / gallops. No extremity edema. 2+ pedal pulses. No carotid bruits.  Abdomen: no tenderness, no masses palpated. No hepatosplenomegaly. Bowel sounds positive.  Musculoskeletal: no clubbing / cyanosis. No joint deformity upper and lower extremities. Good ROM, no contractures. Normal muscle tone.  Skin: no rashes, lesions, ulcers. No induration Neurologic: CN 2-12 grossly intact. Sensation intact, DTR normal. Strength 5/5 in all 4.    Labs on Admission: I have personally reviewed following labs and imaging studies  CBC: Recent Labs  Lab 03/22/20 1444 03/22/20 1459  WBC 5.6  --   HGB 15.0 16.7*  HCT 50.2* 49.0*  MCV 102.2*  --   PLT 127*  --    Basic Metabolic Panel: Recent Labs  Lab 03/22/20 1444 03/22/20 1459  NA 142 144  K 4.4 4.1  CL 107 104  CO2 27  --   GLUCOSE 102* 96  BUN 10 13  CREATININE 1.12* 1.10*  CALCIUM 9.0  --    GFR: Estimated Creatinine  Clearance: 55.1 mL/min (A) (by C-G formula based on SCr of 1.1 mg/dL (H)). Liver Function Tests: Recent Labs  Lab 03/22/20 1444  AST 18  ALT 9  ALKPHOS 68  BILITOT 0.3  PROT 5.5*  ALBUMIN 2.7*   No results for input(s): LIPASE, AMYLASE in the last 168 hours. No results for input(s): AMMONIA in the last 168 hours. Coagulation Profile: Recent Labs  Lab 03/22/20 1444  INR 1.0   Cardiac Enzymes: No results for input(s): CKTOTAL, CKMB, CKMBINDEX, TROPONINI in the last 168 hours. BNP (last 3 results) No results for input(s): PROBNP in the last 8760 hours. HbA1C: No results  for input(s): HGBA1C in the last 72 hours. CBG: Recent Labs  Lab 03/22/20 1449  GLUCAP 94   Lipid Profile: No results for input(s): CHOL, HDL, LDLCALC, TRIG, CHOLHDL, LDLDIRECT in the last 72 hours. Thyroid Function Tests: No results for input(s): TSH, T4TOTAL, FREET4, T3FREE, THYROIDAB in the last 72 hours. Anemia Panel: No results for input(s): VITAMINB12, FOLATE, FERRITIN, TIBC, IRON, RETICCTPCT in the last 72 hours. Urine analysis: No results found for: COLORURINE, APPEARANCEUR, LABSPEC, PHURINE, GLUCOSEU, HGBUR, BILIRUBINUR, KETONESUR, PROTEINUR, UROBILINOGEN, NITRITE, LEUKOCYTESUR  Radiological Exams on Admission: CT HEAD WO CONTRAST  Result Date: 03/22/2020 CLINICAL DATA:  Syncope EXAM: CT HEAD WITHOUT CONTRAST TECHNIQUE: Contiguous axial images were obtained from the base of the skull through the vertex without intravenous contrast. COMPARISON:  None. FINDINGS: Brain: No evidence of acute infarction, hemorrhage, hydrocephalus, extra-axial collection or mass lesion/mass effect. Vascular: No hyperdense vessel or unexpected calcification. Skull: No osseous abnormality. Sinuses/Orbits: Visualized paranasal sinuses are clear. Visualized mastoid sinuses are clear. Visualized orbits demonstrate no focal abnormality. Other: Left parietal scalp laceration with hematoma. IMPRESSION: 1. No acute intracranial  pathology. 2. Left parietal scalp laceration with hematoma. Electronically Signed   By: Kathreen Devoid   On: 03/22/2020 15:48   CT Angio Chest PE W and/or Wo Contrast  Result Date: 03/22/2020 CLINICAL DATA:  59 year old female with syncope. Concern for pulmonary embolism. EXAM: CT ANGIOGRAPHY CHEST WITH CONTRAST TECHNIQUE: Multidetector CT imaging of the chest was performed using the standard protocol during bolus administration of intravenous contrast. Multiplanar CT image reconstructions and MIPs were obtained to evaluate the vascular anatomy. CONTRAST:  64mL OMNIPAQUE IOHEXOL 350 MG/ML SOLN COMPARISON:  Chest CT dated 08/26/2006. FINDINGS: Cardiovascular: There is no cardiomegaly. Small pericardial effusion. There is mild atherosclerotic calcification of the thoracic aorta. Evaluation of the pulmonary arteries is somewhat limited due to respiratory motion artifact. Small right lower lobe segmental or subsegmental nonocclusive pulmonary artery emboli noted, age indeterminate. No large or central pulmonary artery embolus. No CT evidence of right heart straining. Mediastinum/Nodes: There is no hilar or mediastinal adenopathy. The esophagus and the thyroid gland are grossly unremarkable. No mediastinal fluid collection. Lungs/Pleura: There are bibasilar atelectasis. No focal consolidation, pleural effusion, or pneumothorax. The central airways are patent. Upper Abdomen: Probable fatty liver. Musculoskeletal: No chest wall abnormality. No acute or significant osseous findings. Review of the MIP images confirms the above findings. IMPRESSION: 1. Small nodule seen right lower lobe segmental or subsegmental pulmonary artery emboli, age indeterminate. No large or central pulmonary artery embolus. 2. Small pericardial effusion. 3. Aortic Atherosclerosis (ICD10-I70.0). These results were called by telephone at the time of interpretation on 03/22/2020 at 4:15 pm to provider [facility] , who verbally acknowledged these  results. Electronically Signed   By: Anner Crete M.D.   On: 03/22/2020 16:16   CT CERVICAL SPINE WO CONTRAST  Result Date: 03/22/2020 CLINICAL DATA:  Neck trauma, uncomplicated. Fall, trauma, neck pain. EXAM: CT CERVICAL SPINE WITHOUT CONTRAST TECHNIQUE: Multidetector CT imaging of the cervical spine was performed without intravenous contrast. Multiplanar CT image reconstructions were also generated. COMPARISON:  No pertinent prior exams are available for comparison. FINDINGS: Alignment: Cervical levocurvature reversal of the expected cervical lordosis. No significant spondylolisthesis. Skull base and vertebrae: The basion-dental and atlanto-dental intervals are maintained.No evidence of acute fracture to the cervical spine. There are multiple subcentimeter lucent osseous lesions within the cervical spine, for instance within the C3 and C5 vertebral bodies on image 33 of series 13, within the C2 left articular  pillar on image 45 of series 13, and within the C7 right transverse process on series 13, image 22. Soft tissues and spinal canal: No prevertebral fluid or swelling. No visible canal hematoma. Disc levels: Cervical spondylosis. Most notably at C4-C5 there is moderate/advanced disc space narrowing with a bulky posterior disc osteophyte complex which contributes to at least moderate spinal canal stenosis. Upper chest: No consolidation within the imaged lung apices. No visible pneumothorax IMPRESSION: No evidence of acute fracture to the cervical spine. There are several subcentimeter lucent osseous lesions within the cervical spine. These foci are nonspecific and could certainly be a benign. However, osseous metastases cannot be excluded and clinical correlation for any known primary malignancy is recommended. Additionally, multiple myeloma could have a similar imaging appearance and correlation with relevant laboratory values is recommended. Cervical levocurvature and nonspecific reversal of the  expected cervical lordosis. At C4-C5, a bulky posterior disc osteophyte contributes to at least moderate spinal canal stenosis, likely with some degree of spinal cord mass effect. Electronically Signed   By: Kellie Simmering DO   On: 03/22/2020 15:44   DG Pelvis Portable  Result Date: 03/22/2020 CLINICAL DATA:  Syncope with fall. EXAM: PORTABLE PELVIS 1-2 VIEWS COMPARISON:  Sacral x-rays dated September 10, 2006. FINDINGS: There is no evidence of pelvic fracture or diastasis. No pelvic bone lesions are seen. Prior L3-L5 fusion. Kissing iliac stents noted. Surgical clips in the right groin. IMPRESSION: 1. Negative. Electronically Signed   By: Titus Dubin M.D.   On: 03/22/2020 14:56   DG Chest Port 1 View  Result Date: 03/22/2020 CLINICAL DATA:  Syncope with fall. EXAM: PORTABLE CHEST 1 VIEW COMPARISON:  Chest x-ray dated Dec 10, 2019. FINDINGS: The heart size and mediastinal contours are within normal limits. Mild scarring and atelectasis at the peripheral left lung base. No focal consolidation, pleural effusion, or pneumothorax. No acute osseous abnormality. IMPRESSION: 1. No active disease. Electronically Signed   By: Titus Dubin M.D.   On: 03/22/2020 14:54    EKG: Independently reviewed.  Sinus tachycardia with some T wave changes in the lateral leads.  No prior EKG to compare with.  Assessment/Plan Active Problems:   Acute respiratory failure with hypoxia (HCC)   Acute pulmonary embolism (HCC)   Elevated troponin   Laceration of head   Syncope and collapse   AKI (acute kidney injury) ([place])   Acute metabolic encephalopathy: Still seems to be postictal.  Will order EEG to rule out seizure.  Syncope: Her syncope is multifactorial.  It is likely vasovagal as she just had a bowel movement before the syncope happened but could very well be cardiac.  Seizures cannot be ruled out since she has a history of having frequent falls and she is confused.  We will continue to monitor on telemetry.   Perform transthoracic echo.  Order EEG.  Seizure precautions.  She is also on combination of trazodone, Topamax, Zanaflex and Xtampza.  Polypharmacy is also a potential cause of her syncope.  I am holding almost all of those.  Elevated troponin with history of CAD and open heart surgery: 180 first troponin, will follow 2 more troponins.  Monitor on telemetry.  Transthoracic echo.  She will be started on heparin for PE anyways.  Will resume metoprolol and Plavix.  Acute respiratory failure with hypoxia secondary to acute PE: As mentioned above, CT angiogram of the chest shows right lower lobe acute PE, small though.  I have requested ED physician to start patient on heparin  drip.  Will order Doppler lower extremity ultrasound.  AKI/lactic acidosis: Likely due to dehydration/prerenal.  Will start on gentle hydration.  Repeat labs in the morning.  Scalp laceration: She will get stitches in the ER today.  CT head negative.  Pericardial effusion: CT chest shows small pericardial effusion.  Will get transthoracic echo.  I doubt tamponade.  Hyperlipidemia: Resume home dose of statin.  Anxiety: Resume home dose of medications.  DVT prophylaxis: heparin bolus via infusion 3,000 Units Start: 03/22/20 1700 Code Status: Full code Family Communication: Husband present at bedside.  Plan of care discussed with patient in length and he verbalized understanding and agreed with it. Disposition Plan: 1 to 2 days Consults called: None Admission status: Observation   Status is: Observation  The patient remains OBS appropriate and will d/c before 2 midnights.  Dispo: The patient is from: Home              Anticipated d/c is to: Home              Anticipated d/c date is: 1 day              Patient currently is not medically stable to d/c.       Darliss Cheney MD [facility]  03/22/2020, 5:12 PM  To contact the attending provider between 7A-7P or the covering provider during after hours 7P-7A,  please log into the web site [email or web address]

## 2020-03-22 NOTE — ED Provider Notes (Signed)
[facility] Provider Note   CSN: [id number] Arrival date & time: 03/22/20  1426     History Chief Complaint  Patient presents with  . Fall  . Loss of Consciousness    Selena Clark is a 59 y.o. female history of coronary artery disease with 2 open heart surgeries per patient and her husband, on Plavix.  Otherwise healthy, they deny any other chronic medical conditions.  Patient arrives via EMS after syncopal episode today.  Patient reports that she had gotten out of bed and was ambulating to the bathroom shortly after she woke up on the floor, she reports pain to the back of her head and noticed a bump there which was bleeding.  Husband found patient on the ground and called EMS.  Patient reports that she has passed out in the past has never been evaluated prior.  She denies any prodromal symptoms, no chest pain or shortness of breath.  She reports that she is feeling well at this time aside from posterior headache.  Denies recent illness, denies fever/chills, vision changes, difficulty speaking, numbness/ting, weakness, neck pain, chest pain/shortness of breath, cough/hemoptysis, abdominal pain, extremity swelling/color change, nausea/vomiting, abdominal pain or any additional concerns.  Of note patient and her husband report they received 2 doses of Covid vaccine in April.  HPI     History reviewed. No pertinent past medical history.  Patient Active Problem List   Diagnosis Date Noted  . Acute respiratory failure with hypoxia ([place]) 03/22/2020  . Acute pulmonary embolism ([place]) 03/22/2020  . Elevated troponin 03/22/2020  . Laceration of head 03/22/2020  . Syncope and collapse 03/22/2020  . AKI (acute kidney injury) ([place]) 03/22/2020       OB History   No obstetric history on file.     History reviewed. No pertinent family history.  Social History   Tobacco Use  . Smoking status: Not on file  Substance Use Topics  . Alcohol  use: Not on file  . Drug use: Not on file    Home Medications Prior to Admission medications   Medication Sig Start Date End Date Taking? Authorizing Provider  clopidogrel (PLAVIX) 75 MG tablet Take 75 mg by mouth daily. 03/06/20  Yes [provider]  dicyclomine (BENTYL) 20 MG tablet Take 20 mg by mouth 3 (three) times daily. 02/20/20  Yes [provider]  escitalopram (LEXAPRO) 20 MG tablet Take 20 mg by mouth daily. 03/22/20  Yes [provider]  gabapentin (NEURONTIN) 400 MG capsule Take 400 mg by mouth in the morning, at noon, and at bedtime. 02/29/20  Yes [provider]  metoprolol tartrate (LOPRESSOR) 25 MG tablet Take 25 mg by mouth 2 (two) times daily. 03/16/20  Yes [provider]  pantoprazole (PROTONIX) 40 MG tablet Take 40 mg by mouth daily. 02/23/20  Yes [provider]  rosuvastatin (CRESTOR) 40 MG tablet Take 40 mg by mouth daily. 01/25/20  Yes [provider]  tiZANidine (ZANAFLEX) 4 MG tablet Take 4 mg by mouth 3 (three) times daily. 02/20/20  Yes [provider]  topiramate (TOPAMAX) 100 MG tablet Take 100 mg by mouth daily. 03/07/20  Yes [provider]  traZODone (DESYREL) 100 MG tablet Take 200 mg by mouth at bedtime as needed for sleep. 03/02/20  Yes [provider]  XTAMPZA ER 13.5 MG C12A Take 1 capsule by mouth 2 (two) times daily. 10/08/19  Yes [provider]  XTAMPZA ER 18 MG C12A Take  1 capsule by mouth 2 (two) times daily. 03/20/20  Yes [provider]    Allergies    Patient has no known allergies.  Review of Systems   Review of Systems Ten systems are reviewed and are negative for acute change except as noted in the HPI Physical Exam Updated Vital Signs BP (!) 141/77   Pulse (!) 105   Temp 98.7 F (37.1 C) (Oral)   Resp 19   Ht 5' 2"  (1.575 m)   Wt 79.4 kg   SpO2 97%   BMI 32.01 kg/m   Physical Exam Constitutional:      General: She is not in acute  distress.    Appearance: Normal appearance. She is well-developed. She is not ill-appearing or diaphoretic.  HENT:     Head: Normocephalic. Laceration present.     Jaw: There is normal jaw occlusion.      Comments: 1 cm laceration left posterior parietal scalp with small hematoma.    Right Ear: External ear normal.     Left Ear: External ear normal.     Nose: Nose normal.     Mouth/Throat:     Mouth: Mucous membranes are moist.     Pharynx: Oropharynx is clear.  Eyes:     General: Vision grossly intact. Gaze aligned appropriately.     Pupils: Pupils are equal, round, and reactive to light.  Neck:     Trachea: Trachea and phonation normal.  Cardiovascular:     Rate and Rhythm: Normal rate and regular rhythm.     Pulses: Normal pulses.  Pulmonary:     Effort: Pulmonary effort is normal. No respiratory distress.     Breath sounds: Normal breath sounds.  Chest:     Chest wall: No tenderness.  Abdominal:     General: There is no distension.     Palpations: Abdomen is soft.     Tenderness: There is no abdominal tenderness. There is no guarding or rebound.  Musculoskeletal:        General: Normal range of motion.     Cervical back: Normal range of motion. No spinous process tenderness or muscular tenderness.     Comments: No midline C/T/L spinal tenderness to palpation, no paraspinal muscle tenderness, no deformity, crepitus, or step-off noted. No sign of injury to the neck or back.  Pelvis stable to compression bilaterally without pain.  Patient able bring bilateral knees to chest without pain or difficulty.  All major joints of the bilateral lower extremities mobilized without pain or deformity.  All major joints of the bilateral upper extremities mobilized without pain or deformity.  Skin:    General: Skin is warm and dry.  Neurological:     Mental Status: She is alert.     GCS: GCS eye subscore is 4. GCS verbal subscore is 5. GCS motor subscore is 6.     Comments: Speech is clear  and goal oriented, follows commands Major Cranial nerves without deficit, no facial droop Normal strength in upper and lower extremities bilaterally including dorsiflexion and plantar flexion, strong and equal grip strength Sensation normal to light and sharp touch Moves extremities without ataxia, coordination intact Normal finger to nose and rapid alternating movements No pronator drift  Psychiatric:        Behavior: Behavior normal.     ED Results / Procedures / Treatments   Labs (all labs ordered are listed, but only abnormal results are displayed) Labs Reviewed  COMPREHENSIVE METABOLIC PANEL - Abnormal; Notable for  the following components:      Result Value   Glucose, Bld 102 (*)    Creatinine, Ser 1.12 (*)    Total Protein 5.5 (*)    Albumin 2.7 (*)    GFR calc non Af Amer 54 (*)    All other components within normal limits  CBC - Abnormal; Notable for the following components:   HCT 50.2 (*)    MCV 102.2 (*)    MCHC 29.9 (*)    RDW 17.5 (*)    Platelets 127 (*)    All other components within normal limits  URINALYSIS, ROUTINE W REFLEX MICROSCOPIC - Abnormal; Notable for the following components:   Specific Gravity, Urine >1.046 (*)    All other components within normal limits  LACTIC ACID, PLASMA - Abnormal; Notable for the following components:   Lactic Acid, Venous 2.6 (*)    All other components within normal limits  I-STAT CHEM 8, ED - Abnormal; Notable for the following components:   Creatinine, Ser 1.10 (*)    Hemoglobin 16.7 (*)    HCT 49.0 (*)    All other components within normal limits  TROPONIN I (HIGH SENSITIVITY) - Abnormal; Notable for the following components:   Troponin I (High Sensitivity) 180 (*)    All other components within normal limits  SARS CORONAVIRUS 2 BY RT PCR (HOSPITAL ORDER, [facility])  PROTIME-INR  ETHANOL  HEPARIN LEVEL (UNFRACTIONATED)  HEPARIN LEVEL (UNFRACTIONATED)  CBC  HIV ANTIBODY (ROUTINE  TESTING W REFLEX)  MAGNESIUM  TSH  HEMOGLOBIN L9J  BASIC METABOLIC PANEL  CBG MONITORING, ED  I-STAT BETA HCG BLOOD, ED ([facility], [facility], [facility] ONLY)  SAMPLE TO BLOOD BANK  TROPONIN I (HIGH SENSITIVITY)  TROPONIN I (HIGH SENSITIVITY)    EKG EKG Interpretation  Date/Time:  Wednesday March 22 2020 14:28:17 EDT Ventricular Rate:  114 PR Interval:    QRS Duration: 82 QT Interval:  368 QTC Calculation: 507 R Axis:   50 Text Interpretation: Sinus tachycardia Borderline prolonged PR interval Abnormal R-wave progression, early transition Borderline T abnormalities, diffuse leads Borderline prolonged QT interval No old tracing to compare Confirmed by Sherwood Gambler [phone]) on 03/22/2020 4:21:55 PM   Radiology CT HEAD WO CONTRAST  Result Date: 03/22/2020 CLINICAL DATA:  Syncope EXAM: CT HEAD WITHOUT CONTRAST TECHNIQUE: Contiguous axial images were obtained from the base of the skull through the vertex without intravenous contrast. COMPARISON:  None. FINDINGS: Brain: No evidence of acute infarction, hemorrhage, hydrocephalus, extra-axial collection or mass lesion/mass effect. Vascular: No hyperdense vessel or unexpected calcification. Skull: No osseous abnormality. Sinuses/Orbits: Visualized paranasal sinuses are clear. Visualized mastoid sinuses are clear. Visualized orbits demonstrate no focal abnormality. Other: Left parietal scalp laceration with hematoma. IMPRESSION: 1. No acute intracranial pathology. 2. Left parietal scalp laceration with hematoma. Electronically Signed   By: Kathreen Devoid   On: 03/22/2020 15:48   CT Angio Chest PE W and/or Wo Contrast  Result Date: 03/22/2020 CLINICAL DATA:  59 year old female with syncope. Concern for pulmonary embolism. EXAM: CT ANGIOGRAPHY CHEST WITH CONTRAST TECHNIQUE: Multidetector CT imaging of the chest was performed using the standard protocol during bolus administration of intravenous contrast. Multiplanar CT image reconstructions and MIPs were obtained to  evaluate the vascular anatomy. CONTRAST:  21m OMNIPAQUE IOHEXOL 350 MG/ML SOLN COMPARISON:  Chest CT dated 08/26/2006. FINDINGS: Cardiovascular: There is no cardiomegaly. Small pericardial effusion. There is mild atherosclerotic calcification of the thoracic aorta. Evaluation of the pulmonary arteries is somewhat limited due  to respiratory motion artifact. Small right lower lobe segmental or subsegmental nonocclusive pulmonary artery emboli noted, age indeterminate. No large or central pulmonary artery embolus. No CT evidence of right heart straining. Mediastinum/Nodes: There is no hilar or mediastinal adenopathy. The esophagus and the thyroid gland are grossly unremarkable. No mediastinal fluid collection. Lungs/Pleura: There are bibasilar atelectasis. No focal consolidation, pleural effusion, or pneumothorax. The central airways are patent. Upper Abdomen: Probable fatty liver. Musculoskeletal: No chest wall abnormality. No acute or significant osseous findings. Review of the MIP images confirms the above findings. IMPRESSION: 1. Small nodule seen right lower lobe segmental or subsegmental pulmonary artery emboli, age indeterminate. No large or central pulmonary artery embolus. 2. Small pericardial effusion. 3. Aortic Atherosclerosis (ICD10-I70.0). These results were called by telephone at the time of interpretation on 03/22/2020 at 4:15 pm to provider [facility] , who verbally acknowledged these results. Electronically Signed   By: Anner Crete M.D.   On: 03/22/2020 16:16   CT CERVICAL SPINE WO CONTRAST  Result Date: 03/22/2020 CLINICAL DATA:  Neck trauma, uncomplicated. Fall, trauma, neck pain. EXAM: CT CERVICAL SPINE WITHOUT CONTRAST TECHNIQUE: Multidetector CT imaging of the cervical spine was performed without intravenous contrast. Multiplanar CT image reconstructions were also generated. COMPARISON:  No pertinent prior exams are available for comparison. FINDINGS: Alignment: Cervical  levocurvature reversal of the expected cervical lordosis. No significant spondylolisthesis. Skull base and vertebrae: The basion-dental and atlanto-dental intervals are maintained.No evidence of acute fracture to the cervical spine. There are multiple subcentimeter lucent osseous lesions within the cervical spine, for instance within the C3 and C5 vertebral bodies on image 33 of series 13, within the C2 left articular pillar on image 45 of series 13, and within the C7 right transverse process on series 13, image 22. Soft tissues and spinal canal: No prevertebral fluid or swelling. No visible canal hematoma. Disc levels: Cervical spondylosis. Most notably at C4-C5 there is moderate/advanced disc space narrowing with a bulky posterior disc osteophyte complex which contributes to at least moderate spinal canal stenosis. Upper chest: No consolidation within the imaged lung apices. No visible pneumothorax IMPRESSION: No evidence of acute fracture to the cervical spine. There are several subcentimeter lucent osseous lesions within the cervical spine. These foci are nonspecific and could certainly be a benign. However, osseous metastases cannot be excluded and clinical correlation for any known primary malignancy is recommended. Additionally, multiple myeloma could have a similar imaging appearance and correlation with relevant laboratory values is recommended. Cervical levocurvature and nonspecific reversal of the expected cervical lordosis. At C4-C5, a bulky posterior disc osteophyte contributes to at least moderate spinal canal stenosis, likely with some degree of spinal cord mass effect. Electronically Signed   By: Kellie Simmering DO   On: 03/22/2020 15:44   DG Pelvis Portable  Result Date: 03/22/2020 CLINICAL DATA:  Syncope with fall. EXAM: PORTABLE PELVIS 1-2 VIEWS COMPARISON:  Sacral x-rays dated September 10, 2006. FINDINGS: There is no evidence of pelvic fracture or diastasis. No pelvic bone lesions are seen. Prior  L3-L5 fusion. Kissing iliac stents noted. Surgical clips in the right groin. IMPRESSION: 1. Negative. Electronically Signed   By: Titus Dubin M.D.   On: 03/22/2020 14:56   DG Chest Port 1 View  Result Date: 03/22/2020 CLINICAL DATA:  Syncope with fall. EXAM: PORTABLE CHEST 1 VIEW COMPARISON:  Chest x-ray dated Dec 10, 2019. FINDINGS: The heart size and mediastinal contours are within normal limits. Mild scarring and atelectasis at the peripheral left lung base.  No focal consolidation, pleural effusion, or pneumothorax. No acute osseous abnormality. IMPRESSION: 1. No active disease. Electronically Signed   By: Titus Dubin M.D.   On: 03/22/2020 14:54    Procedures .Critical Care Performed by: Deliah Boston, PA-C Authorized by: Deliah Boston, PA-C   Critical care provider statement:    Critical care time (minutes):  40   Critical care was necessary to treat or prevent imminent or life-threatening deterioration of the following conditions:  Respiratory failure   Critical care was time spent personally by me on the following activities:  Discussions with consultants, evaluation of patient's response to treatment, examination of patient, ordering and performing treatments and interventions, ordering and review of laboratory studies, ordering and review of radiographic studies, pulse oximetry, re-evaluation of patient's condition, obtaining history from patient or surrogate, review of old charts and development of treatment plan with patient or surrogate  .Marland KitchenLaceration Repair  Date/Time: 03/22/2020 5:17 PM Performed by: Deliah Boston, PA-C Authorized by: Deliah Boston, PA-C   Consent:    Consent obtained:  Verbal   Consent given by:  Patient and spouse   Risks discussed:  Infection, need for additional repair, nerve damage, poor wound healing, poor cosmetic result, pain, retained foreign body, tendon damage and vascular damage Anesthesia (see MAR for exact dosages):     Anesthesia method:  Topical application   Topical anesthetic:  LET Laceration details:    Location:  Scalp   Scalp location:  L parietal   Length (cm):  1   Depth (mm):  5 Repair type:    Repair type:  Simple Pre-procedure details:    Preparation:  Patient was prepped and draped in usual sterile fashion and imaging obtained to evaluate for foreign bodies Exploration:    Hemostasis achieved with:  LET   Wound exploration: wound explored through full range of motion and entire depth of wound probed and visualized     Wound extent: no foreign bodies/material noted, no muscle damage noted, no nerve damage noted, no tendon damage noted, no underlying fracture noted and no vascular damage noted     Contaminated: no   Treatment:    Area cleansed with:  Betadine   Amount of cleaning:  Standard Skin repair:    Repair method:  Staples   Number of staples:  1 Approximation:    Approximation:  Close Post-procedure details:    Dressing:  Antibiotic ointment, non-adherent dressing and sterile dressing   Patient tolerance of procedure:  Tolerated well, no immediate complications Comments:     Dressing by nursing staff   (including critical care time)  Medications Ordered in ED Medications  heparin bolus via infusion 3,000 Units (has no administration in time range)  heparin ADULT infusion 100 units/mL (25000 units/264m sodium chloride 0.45%) (has no administration in time range)  metoprolol tartrate (LOPRESSOR) tablet 25 mg (has no administration in time range)  rosuvastatin (CRESTOR) tablet 40 mg (40 mg Oral Given 03/22/20 1723)  escitalopram (LEXAPRO) tablet 20 mg (20 mg Oral Given 03/22/20 1724)  traZODone (DESYREL) tablet 200 mg (has no administration in time range)  dicyclomine (BENTYL) tablet 20 mg (20 mg Oral Given 03/22/20 1724)  pantoprazole (PROTONIX) EC tablet 40 mg (40 mg Oral Given 03/22/20 1723)  clopidogrel (PLAVIX) tablet 75 mg (75 mg Oral Given 03/22/20 1723)  gabapentin  (NEURONTIN) capsule 400 mg (has no administration in time range)  topiramate (TOPAMAX) tablet 100 mg (100 mg Oral Given 03/22/20 1724)  0.9 %  sodium  chloride infusion (has no administration in time range)  acetaminophen (TYLENOL) tablet 650 mg (has no administration in time range)    Or  acetaminophen (TYLENOL) suppository 650 mg (has no administration in time range)  ondansetron (ZOFRAN) tablet 4 mg (has no administration in time range)    Or  ondansetron (ZOFRAN) injection 4 mg (has no administration in time range)  bacitracin ointment (has no administration in time range)  sodium chloride 0.9 % bolus 1,000 mL (1,000 mLs Intravenous New Bag/Given 03/22/20 1516)  iohexol (OMNIPAQUE) 350 MG/ML injection 75 mL (75 mLs Intravenous Contrast Given 03/22/20 1547)  lidocaine-EPINEPHrine-tetracaine (LET) topical gel (3 mLs Topical Given by Other 03/22/20 1711)    ED Course  I have reviewed the triage vital signs and the nursing notes.  Pertinent labs & imaging results that were available during my care of the patient were reviewed by me and considered in my medical decision making (see chart for details).  Clinical Course as of Mar 22 1730  Wed Mar 22, 2020  1517 CT   [BM]  1636 Dr. Doristine Bosworth   [BM]    Clinical Course User Index [BM] Gari Crown   MDM Rules/Calculators/A&P                         Additional history obtained from: 1. Nursing notes from this visit. 2. Unable to access previous medical records initially due to registration error. 3. Family, husband at bedside. --------------------------------- 59 year old female arrived after syncopal episode this morning, struck the back of her head on Plavix.  Level 2 trauma initiated on my initial evaluation.  Chest x-ray, pelvis x-ray, CT head/cervical spine and labs were ordered.  Patient placed on cardiac monitor and pulse oximeter.  Patient has a small laceration with hematoma of the left occipital scalp.  Denies neck pain  or neurologic symptoms.  Cranial nerves intact.  No evidence of injury to the chest abdomen back or extremities x4.  She denies any pain aside from the laceration/hematoma of the scalp.  After patient returned from [place] noted to be hypoxic with sats in the upper 70s, patient was evaluated by Dr. Regenia Skeeter, placed on nasal cannula with improvement of SPO2 to the 90s on 4 L.  CT angio PE study ordered given patient's mild tachycardia and new hypoxia.  Low suspicion for infectious process at this time given lack of fever, additionally patient denies any infectious type symptoms. - EKG: Sinus tachycardia Borderline prolonged PR interval Abnormal R-wave progression, early transition Borderline T abnormalities, diffuse leads Borderline prolonged QT interval No old tracing to compare Confirmed by Sherwood Gambler [phone]) on 03/22/2020 4:21:55 PM  I ordered, reviewed and interpreted labs which include: CBG within normal limits, no evidence of hypoglycemia. Pregnancy test negative. Lactic elevated 2.6 may be secondary to dehydration and pulmonary embolism, doubt sepsis. High-sensitivity troponin elevated at 180 possibly secondary to PE versus arrhythmia. I-STAT Chem-8 shows mild elevation of creatinine possibly secondary to dehydration. Urinalysis shows no evidence of infection. CMP shows mild elevation of creatinine again secondary to dehydration, no emergent Electra derangement, gap or LFT elevations.   CBC shows no leukocytosis to suggest infection or anemia.  CT head: Showed the small left parietal scalp laceration and hematoma.  Laceration cleaned and repaired.  No acute intracranial abnormalities.  CT cervical spine: Showed no fracture dislocation did show abnormal lesions within the cervical spine will need further work-up and patient.  Chest x-ray: No active disease  Pelvis x-ray: Negative  CT angio Chest PE study: Significant for age-indeterminate right lower lobe pulmonary emboli, small  pericardial effusion and atherosclerosis.  PE may be cause of patient's syncope and hypoxia.  Patient was reassessed multiple times resting comfortably no acute distress she denies any chest pain or shortness of breath but remains minimally tachycardic and requiring supplemental oxygen.  Patient will need admission, consult placed to hospitalist, patient and her husband are agreeable for admission.  Covid test pending. - Discussed case with Dr. Doristine Bosworth at 4:36 PM, heparin order per pharmacy, patient accepted to hospital service. - 5:15PM:, Patient reassessed resting comfortably no acute distress no current concerns.  KAYTELYNN SCRIPTER was evaluated in Emergency Department on 03/22/2020 for the symptoms described in the history of present illness. She was evaluated in the context of the global COVID-19 pandemic, which necessitated consideration that the patient might be at risk for infection with the SARS-CoV-2 virus that causes COVID-19. Institutional protocols and algorithms that pertain to the evaluation of patients at risk for COVID-19 are in a state of rapid change based on information released by regulatory bodies including the CDC and federal and state organizations. These policies and algorithms were followed during the patient's care in the ED.  Note: Portions of this report may have been transcribed using voice recognition software. Every effort was made to ensure accuracy; however, inadvertent computerized transcription errors may still be present. Final Clinical Impression(s) / ED Diagnoses Final diagnoses:  Fall  Acute respiratory failure with hypoxia ([place])  Pulmonary embolism, unspecified chronicity, unspecified pulmonary embolism type, unspecified whether acute cor pulmonale present ([facility])  Syncope and collapse  Elevated troponin  Lesion of bone of cervical spine    Rx / DC Orders ED Discharge Orders    None       Gari Crown 03/22/20 1734    Sherwood Gambler,  MD 03/23/20 1709

## 2020-03-22 NOTE — Progress Notes (Signed)
Orthopedic Tech Progress Note Patient Details:  [name] 14-May-1961 [id number] Level 2 trauma Patient ID: Selena Clark, female   DOB: 1961-01-03, 59 y.o.   MRN: [id number]   Donald Pore 03/22/2020, 3:22 PM

## 2020-03-22 NOTE — ED Triage Notes (Addendum)
Pt arrived via EMS from home after syncopal episode. Pt hit the left side of her head and is on plavix. C-collar in place.

## 2020-03-22 NOTE — Progress Notes (Signed)
ANTICOAGULATION CONSULT NOTE - Initial Consult  Pharmacy Consult for heparin Indication: pulmonary embolus  No Known Allergies  Patient Measurements: Height: 5\' 2"  (157.5 cm) Weight: 79.4 kg (175 lb) IBW/kg (Calculated) : 50.1 Heparin Dosing Weight: 67.7kg  Vital Signs: Temp: 98.7 F (37.1 C) (08/18 1426) Temp Source: Oral (08/18 1426) BP: 141/77 (08/18 1601) Pulse Rate: 105 (08/18 1601)  Labs: Recent Labs    03/22/20 1444 03/22/20 1459  HGB 15.0 16.7*  HCT 50.2* 49.0*  PLT 127*  --   LABPROT 12.5  --   INR 1.0  --   CREATININE 1.12* 1.10*  TROPONINIHS 180*  --     Estimated Creatinine Clearance: 55.1 mL/min (A) (by C-G formula based on SCr of 1.1 mg/dL (H)).   Medical History: No past medical history on file.  Medications:  Infusions:  . sodium chloride    . heparin      Assessment: 26 yof presented s/p syncopal episode and fall. Found to have a small PE. To start IV heparin. CT head negative for intracranial bleed but pt does have a scalp lac with hematoma. Baseline Hgb is WNL and platelets are slightly low. She is not on anticoagulation PTA.   Goal of Therapy:  Heparin level 0.3-0.7 units/ml Monitor platelets by anticoagulation protocol: Yes   Plan:  Heparin bolus 3000 units IV x 1 - conservative due to scalp lac with hematoma Heparin gtt 1100 units/hr Check a 6 hr heparin level Daily heparin level and CBC  Selena Clark, 81 03/22/2020,4:58 PM

## 2020-03-22 NOTE — Progress Notes (Signed)
EEG complete - results pending 

## 2020-03-22 NOTE — Progress Notes (Signed)
  Echocardiogram 2D Echocardiogram has been performed.  Delcie Roch 03/22/2020, 5:54 PM

## 2020-03-22 NOTE — ED Notes (Signed)
Patient transported to CT 

## 2020-03-23 ENCOUNTER — Observation Stay (HOSPITAL_COMMUNITY): Payer: Medicare HMO

## 2020-03-23 ENCOUNTER — Encounter (HOSPITAL_COMMUNITY): Payer: Self-pay | Admitting: Family Medicine

## 2020-03-23 DIAGNOSIS — I2699 Other pulmonary embolism without acute cor pulmonale: Secondary | ICD-10-CM

## 2020-03-23 DIAGNOSIS — Z79899 Other long term (current) drug therapy: Secondary | ICD-10-CM | POA: Diagnosis not present

## 2020-03-23 DIAGNOSIS — N179 Acute kidney failure, unspecified: Secondary | ICD-10-CM | POA: Diagnosis present

## 2020-03-23 DIAGNOSIS — R4182 Altered mental status, unspecified: Secondary | ICD-10-CM

## 2020-03-23 DIAGNOSIS — Z7902 Long term (current) use of antithrombotics/antiplatelets: Secondary | ICD-10-CM | POA: Diagnosis not present

## 2020-03-23 DIAGNOSIS — R569 Unspecified convulsions: Secondary | ICD-10-CM

## 2020-03-23 DIAGNOSIS — F419 Anxiety disorder, unspecified: Secondary | ICD-10-CM | POA: Diagnosis present

## 2020-03-23 DIAGNOSIS — R778 Other specified abnormalities of plasma proteins: Secondary | ICD-10-CM | POA: Diagnosis present

## 2020-03-23 DIAGNOSIS — Z9181 History of falling: Secondary | ICD-10-CM | POA: Diagnosis not present

## 2020-03-23 DIAGNOSIS — I129 Hypertensive chronic kidney disease with stage 1 through stage 4 chronic kidney disease, or unspecified chronic kidney disease: Secondary | ICD-10-CM | POA: Diagnosis present

## 2020-03-23 DIAGNOSIS — D696 Thrombocytopenia, unspecified: Secondary | ICD-10-CM | POA: Diagnosis present

## 2020-03-23 DIAGNOSIS — E785 Hyperlipidemia, unspecified: Secondary | ICD-10-CM | POA: Diagnosis present

## 2020-03-23 DIAGNOSIS — Z951 Presence of aortocoronary bypass graft: Secondary | ICD-10-CM | POA: Diagnosis not present

## 2020-03-23 DIAGNOSIS — S0101XA Laceration without foreign body of scalp, initial encounter: Secondary | ICD-10-CM | POA: Diagnosis present

## 2020-03-23 DIAGNOSIS — E860 Dehydration: Secondary | ICD-10-CM | POA: Diagnosis present

## 2020-03-23 DIAGNOSIS — F17210 Nicotine dependence, cigarettes, uncomplicated: Secondary | ICD-10-CM | POA: Diagnosis present

## 2020-03-23 DIAGNOSIS — I2510 Atherosclerotic heart disease of native coronary artery without angina pectoris: Secondary | ICD-10-CM | POA: Diagnosis present

## 2020-03-23 DIAGNOSIS — N1831 Chronic kidney disease, stage 3a: Secondary | ICD-10-CM | POA: Diagnosis present

## 2020-03-23 DIAGNOSIS — E872 Acidosis: Secondary | ICD-10-CM | POA: Diagnosis present

## 2020-03-23 DIAGNOSIS — R55 Syncope and collapse: Secondary | ICD-10-CM | POA: Diagnosis present

## 2020-03-23 DIAGNOSIS — I2694 Multiple subsegmental pulmonary emboli without acute cor pulmonale: Secondary | ICD-10-CM | POA: Diagnosis present

## 2020-03-23 DIAGNOSIS — I951 Orthostatic hypotension: Secondary | ICD-10-CM | POA: Diagnosis present

## 2020-03-23 DIAGNOSIS — J9601 Acute respiratory failure with hypoxia: Secondary | ICD-10-CM | POA: Diagnosis present

## 2020-03-23 DIAGNOSIS — G9341 Metabolic encephalopathy: Secondary | ICD-10-CM | POA: Diagnosis present

## 2020-03-23 DIAGNOSIS — Z20822 Contact with and (suspected) exposure to covid-19: Secondary | ICD-10-CM | POA: Diagnosis present

## 2020-03-23 DIAGNOSIS — Y92002 Bathroom of unspecified non-institutional (private) residence single-family (private) house as the place of occurrence of the external cause: Secondary | ICD-10-CM | POA: Diagnosis not present

## 2020-03-23 DIAGNOSIS — Z8249 Family history of ischemic heart disease and other diseases of the circulatory system: Secondary | ICD-10-CM | POA: Diagnosis not present

## 2020-03-23 DIAGNOSIS — W1839XA Other fall on same level, initial encounter: Secondary | ICD-10-CM | POA: Diagnosis present

## 2020-03-23 DIAGNOSIS — K589 Irritable bowel syndrome without diarrhea: Secondary | ICD-10-CM | POA: Diagnosis present

## 2020-03-23 LAB — BASIC METABOLIC PANEL
Anion gap: 6 (ref 5–15)
BUN: 9 mg/dL (ref 6–20)
CO2: 26 mmol/L (ref 22–32)
Calcium: 8.4 mg/dL — ABNORMAL LOW (ref 8.9–10.3)
Chloride: 109 mmol/L (ref 98–111)
Creatinine, Ser: 1.1 mg/dL — ABNORMAL HIGH (ref 0.44–1.00)
GFR calc Af Amer: >60 mL/min (ref >60)
GFR calc non Af Amer: 56 mL/min — ABNORMAL LOW (ref >60)
Glucose, Bld: 86 mg/dL (ref 70–99)
Potassium: 3.6 mmol/L (ref 3.5–5.1)
Sodium: 141 mmol/L (ref 135–145)

## 2020-03-23 LAB — CBC
HCT: 43.8 % (ref 36.0–46.0)
Hemoglobin: 13.3 g/dL (ref 12.0–15.0)
MCH: 30.8 pg (ref 26.0–34.0)
MCHC: 30.4 g/dL (ref 30.0–36.0)
MCV: 101.4 fL — ABNORMAL HIGH (ref 80.0–100.0)
Platelets: 122 10*3/uL — ABNORMAL LOW (ref 150–400)
RBC: 4.32 MIL/uL (ref 3.87–5.11)
RDW: 17.2 % — ABNORMAL HIGH (ref 11.5–15.5)
WBC: 5.2 10*3/uL (ref 4.0–10.5)
nRBC: 0 % (ref 0.0–0.2)

## 2020-03-23 LAB — ECHOCARDIOGRAM COMPLETE
Height: 62 in
S' Lateral: 2.7 cm
Weight: 2800 oz

## 2020-03-23 LAB — MAGNESIUM: Magnesium: 1.7 mg/dL (ref 1.7–2.4)

## 2020-03-23 LAB — TROPONIN I (HIGH SENSITIVITY)
Troponin I (High Sensitivity): 380 ng/L (ref <18)
Troponin I (High Sensitivity): 144 ng/L (ref <18)

## 2020-03-23 LAB — HEPARIN LEVEL (UNFRACTIONATED)
Heparin Unfractionated: 0.81 IU/mL — ABNORMAL HIGH (ref 0.30–0.70)
Heparin Unfractionated: 0.64 IU/mL (ref 0.30–0.70)

## 2020-03-23 LAB — LACTIC ACID, PLASMA: Lactic Acid, Venous: 0.8 mmol/L (ref 0.5–1.9)

## 2020-03-23 NOTE — Procedures (Addendum)
Patient Name: Selena Clark  MRN: [id number]  Epilepsy Attending: Charlsie Quest  Referring Physician/Provider: Dr Hughie Closs Date: 03/22/2020   Duration: 28.10 mins  Patient history: 59yo F with syncope and ams. EEG to evaluate for seizure.   Level of alertness: Awake  AEDs during EEG study: GBP, TPM  Technical aspects: This EEG study was done with scalp electrodes positioned according to the 10-20 International system of electrode placement. Electrical activity was acquired at a sampling rate of 500Hz  and reviewed with a high frequency filter of 70Hz  and a low frequency filter of 1Hz . EEG data were recorded continuously and digitally stored.   Description: No posterior dominant rhythm was seen. EEG showed continuous generalized 5-8 Hz theta-alpha activity as well as 2-3hz  delta slowing was noted. Hyperventilation and photic stimulation were not performed.    Bilateral twitching of upper extremities and eyelid twitching was intermittently noted during study. Concomitant eeg before, during and after eeg didn't show any eeg change to suggest seizure.    ABNORMALITY -Continuous slow, generalized  IMPRESSION: This study is suggestive of mild to moderate diffuse encephalopathy, nonspecific etiology. No seizures or epileptiform discharges were seen throughout the recording.  Blima Jaimes 

## 2020-03-23 NOTE — Progress Notes (Addendum)
PROGRESS NOTE    Selena Clark  JSE:[id number] DOB: 1961-03-10 DOA: 03/22/2020 PCP: Kateri Mc, MD   Brief Narrative:  HPI: Selena Clark is a 59 y.o. female with medical history significant of CAD s/p open heart surgery on Plavix, IBS, anxiety, hypertension and hyperlipidemia was brought into the ED due to concern of passing out.  History was obtained from the ED physician as well as husband who was sitting at the bedside.  According to husband, patient was in the bathroom having a bowel movement and right she was washing her hands at the sink, she suddenly passed out.  He was in the room and heard the noise so he attended her.  He found her on the floor.  She was unconscious however soon within a minute, she regained her consciousness but was confused.  EMS was called.  She was brought into the ED.  According to patient, she did not have any prodromal symptoms prior to this episode such as chest pain, dizziness, shortness of breath, nausea, vomiting, headache or any other complaint.  According to the husband, she keeps falling and falls approximately twice a month but has never sought any medical attention before.  He thinks that she is overmedicated.  ED Course: Upon arrival to ED, she was hypoxic with oxygen saturation in 50s.  Required nonrebreather and soon was transitioned to nasal cannula and currently on 4 L of nasal cannula.  Due to this, CT angiogram of the chest was done which ruled out large PE however was found to have 2 small subsegmental right lower lobe PEs.  Extensive imaging studies including chest x-ray, pelvic x-ray, CT cervical spine and CT head were all unremarkable.  She also had elevated creatinine of 1.12.  We do not have any prior labs to compare with.  Likely AKI.  Slightly elevated lactic acid of 2.6.  Troponin elevated at 180.  Also has thrombocytopenia with platelets of 127.  Upon my evaluation, patient is alert however she remains confused.  She knows she is  in the hospital, she was able to tell me her name, her date of birth and her husband's name however she could not tell me the year and the month.  She kept repeating herself.  Assessment & Plan:   Active Problems:   Acute respiratory failure with hypoxia (HCC)   Acute pulmonary embolism (HCC)   Elevated troponin   Laceration of head   Syncope and collapse   AKI (acute kidney injury) ([place])   Acute metabolic encephalopathy: Source remains unclear.  CT head was unremarkable.  EEG negative for any seizure.  No electrolyte abnormalities and no infection.  However patient has improved and she is completely back to alert and oriented at baseline today.  Syncope: Her syncope is multifactorial.  It is likely vasovagal as she just had a bowel movement before the syncope happened but could very well be cardiac.  Seizures cannot be ruled out since she has a history of having frequent falls and she is confused.  We will continue to monitor on telemetry.  Transthoracic echo done but results are still pending from yesterday.  EEG negative for any seizures. She is also on combination of trazodone, Topamax, Zanaflex and Xtampza.  Polypharmacy is also a potential cause of her syncope.  I am holding almost all of those.  Elevated troponin with history of CAD and open heart surgery: 180> 380.  Patient with no chest pain or shortness of breath.  Echo results pending.  I had ordered 2 more troponins this morning at 8:15 AM however labs are not drawn 5 hours later until this note is being dictated at 1:15 PM.  I had reached out to the nurses couple of times.  Phlebotomy is aware.  My plan was to follow-up on the troponin and if further elevation, consult cardiology however I am afraid that delay labs is causing delay in care and thus I will consult cardiology now.  Acute respiratory failure with hypoxia secondary to acute PE: As mentioned above, CT angiogram of the chest shows right lower lobe acute PE, small though  and not enough to cause hypoxia.  Currently patient is on 2 L of nasal cannula oxygen but saturating 97%.  I have placed an order to wean oxygen to room air and also placed in order to provide patient with incentive spirometry yesterday but there was no incentive spirometry at patient's bedside today.  AKI/lactic acidosis: Likely due to dehydration/prerenal.  Continue gentle hydration.  Lactic acid ordered 5 hours ago, still not done.  GFR is still 54.  Stable since yesterday.  Wonder if she has history of CKD stage IIIa.  Will repeat labs in the morning and determine.  Scalp laceration: S/p stitches in the ED.  CT head negative.  Pericardial effusion: CT chest shows small pericardial effusion.  Echo results pending.  Hyperlipidemia: Continue home dose of statin.  Anxiety: Continue home dose of medications.   DVT prophylaxis:    Code Status: Full Code  Family Communication:  None present at bedside.  Plan of care discussed with patient in length and she verbalized understanding and agreed with it.  Status is: Inpatient  Remains inpatient appropriate because:Inpatient level of care appropriate due to severity of illness   Dispo: The patient is from: Home              Anticipated d/c is to: Home              Anticipated d/c date is: 1 day              Patient currently is not medically stable to d/c.        Estimated body mass index is 32.01 kg/m as calculated from the following:   Height as of this encounter: $RemoveBeforeD'5\' 2"'iGZRmioHMaDdDp$  (1.575 m).   Weight as of this encounter: 79.4 kg.      Nutritional status:               Consultants:   Cardiology,  Procedures:   None  Antimicrobials:  Anti-infectives (From admission, onward)   None         Subjective: Seen and examined.  She is still in the ED.  She is completely alert and oriented and has no complaints.  She is back to her baseline but still feels slightly weak and looks weak as well.  Objective: Vitals:    03/23/20 0515 03/23/20 0645 03/23/20 0946 03/23/20 1101  BP:  (!) 146/88 (!) 170/98   Pulse: 79 78 81 78  Resp: $Remo'14 14 14 15  'lnrhs$ Temp:      TempSrc:      SpO2: 96% 97% 96% 96%  Weight:      Height:       No intake or output data in the 24 hours ending 03/23/20 1310 Filed Weights   03/22/20 1650  Weight: 79.4 kg    Examination:  General exam: Appears calm and comfortable  Respiratory system: Clear to auscultation. Respiratory effort normal.  Cardiovascular system: S1 & S2 heard, RRR. No JVD, murmurs, rubs, gallops or clicks. No pedal edema. Gastrointestinal system: Abdomen is nondistended, soft and nontender. No organomegaly or masses felt. Normal bowel sounds heard. Central nervous system: Alert and oriented. No focal neurological deficits. Extremities: Symmetric 5 x 5 power. Skin: No rashes, lesions or ulcers Psychiatry: Judgement and insight appear normal. Mood & affect appropriate.    Data Reviewed: I have personally reviewed following labs and imaging studies  CBC: Recent Labs  Lab 03/22/20 1444 03/22/20 1459 03/23/20 0406  WBC 5.6  --  5.2  HGB 15.0 16.7* 13.3  HCT 50.2* 49.0* 43.8  MCV 102.2*  --  101.4*  PLT 127*  --  073*   Basic Metabolic Panel: Recent Labs  Lab 03/22/20 1444 03/22/20 1459 03/22/20 2244 03/23/20 0406  NA 142 144  --  141  K 4.4 4.1  --  3.6  CL 107 104  --  109  CO2 27  --   --  26  GLUCOSE 102* 96  --  86  BUN 10 13  --  9  CREATININE 1.12* 1.10*  --  1.10*  CALCIUM 9.0  --   --  8.4*  MG  --   --  1.7  --    GFR: Estimated Creatinine Clearance: 55.1 mL/min (A) (by C-G formula based on SCr of 1.1 mg/dL (H)). Liver Function Tests: Recent Labs  Lab 03/22/20 1444  AST 18  ALT 9  ALKPHOS 68  BILITOT 0.3  PROT 5.5*  ALBUMIN 2.7*   No results for input(s): LIPASE, AMYLASE in the last 168 hours. No results for input(s): AMMONIA in the last 168 hours. Coagulation Profile: Recent Labs  Lab 03/22/20 1444  INR 1.0   Cardiac  Enzymes: No results for input(s): CKTOTAL, CKMB, CKMBINDEX, TROPONINI in the last 168 hours. BNP (last 3 results) No results for input(s): PROBNP in the last 8760 hours. HbA1C: Recent Labs    03/22/20 1444  HGBA1C 5.8*   CBG: Recent Labs  Lab 03/22/20 1449  GLUCAP 94   Lipid Profile: No results for input(s): CHOL, HDL, LDLCALC, TRIG, CHOLHDL, LDLDIRECT in the last 72 hours. Thyroid Function Tests: Recent Labs    03/22/20 1444  TSH 0.744   Anemia Panel: No results for input(s): VITAMINB12, FOLATE, FERRITIN, TIBC, IRON, RETICCTPCT in the last 72 hours. Sepsis Labs: Recent Labs  Lab 03/22/20 1451  LATICACIDVEN 2.6*    Recent Results (from the past 240 hour(s))  SARS Coronavirus 2 by RT PCR (hospital order, performed in [facility] hospital lab) Nasopharyngeal Nasopharyngeal Swab     Status: None   Collection Time: 03/22/20  4:33 PM   Specimen: Nasopharyngeal Swab  Result Value Ref Range Status   SARS Coronavirus 2 NEGATIVE NEGATIVE Final    Comment: (NOTE) SARS-CoV-2 target nucleic acids are NOT DETECTED.  The SARS-CoV-2 RNA is generally detectable in upper and lower respiratory specimens during the acute phase of infection. The lowest concentration of SARS-CoV-2 viral copies this assay can detect is 250 copies / mL. A negative result does not preclude SARS-CoV-2 infection and should not be used as the sole basis for treatment or other patient management decisions.  A negative result may occur with improper specimen collection / handling, submission of specimen other than nasopharyngeal swab, presence of viral mutation(s) within the areas targeted by this assay, and inadequate number of viral copies (<250 copies / mL). A negative result must be combined with clinical observations, patient history,  and epidemiological information.  Fact Sheet for Patients:   StrictlyIdeas.no  Fact Sheet for Healthcare  Providers: BankingDealers.co.za  This test is not yet approved or  cleared by the [place] FDA and has been authorized for detection and/or diagnosis of SARS-CoV-2 by FDA under an Emergency Use Authorization (EUA).  This EUA will remain in effect (meaning this test can be used) for the duration of the COVID-19 declaration under Section 564(b)(1) of the Act, 21 U.S.C. section 360bbb-3(b)(1), unless the authorization is terminated or revoked sooner.  Performed at [facility], [place] [address] [place], [place] [address]       Radiology Studies: CT HEAD WO CONTRAST  Result Date: 03/22/2020 CLINICAL DATA:  Syncope EXAM: CT HEAD WITHOUT CONTRAST TECHNIQUE: Contiguous axial images were obtained from the base of the skull through the vertex without intravenous contrast. COMPARISON:  None. FINDINGS: Brain: No evidence of acute infarction, hemorrhage, hydrocephalus, extra-axial collection or mass lesion/mass effect. Vascular: No hyperdense vessel or unexpected calcification. Skull: No osseous abnormality. Sinuses/Orbits: Visualized paranasal sinuses are clear. Visualized mastoid sinuses are clear. Visualized orbits demonstrate no focal abnormality. Other: Left parietal scalp laceration with hematoma. IMPRESSION: 1. No acute intracranial pathology. 2. Left parietal scalp laceration with hematoma. Electronically Signed   By: Kathreen Devoid   On: 03/22/2020 15:48   CT Angio Chest PE W and/or Wo Contrast  Result Date: 03/22/2020 CLINICAL DATA:  59 year old female with syncope. Concern for pulmonary embolism. EXAM: CT ANGIOGRAPHY CHEST WITH CONTRAST TECHNIQUE: Multidetector CT imaging of the chest was performed using the standard protocol during bolus administration of intravenous contrast. Multiplanar CT image reconstructions and MIPs were obtained to evaluate the vascular anatomy. CONTRAST:  79mL OMNIPAQUE IOHEXOL 350 MG/ML SOLN COMPARISON:  Chest CT dated 08/26/2006.  FINDINGS: Cardiovascular: There is no cardiomegaly. Small pericardial effusion. There is mild atherosclerotic calcification of the thoracic aorta. Evaluation of the pulmonary arteries is somewhat limited due to respiratory motion artifact. Small right lower lobe segmental or subsegmental nonocclusive pulmonary artery emboli noted, age indeterminate. No large or central pulmonary artery embolus. No CT evidence of right heart straining. Mediastinum/Nodes: There is no hilar or mediastinal adenopathy. The esophagus and the thyroid gland are grossly unremarkable. No mediastinal fluid collection. Lungs/Pleura: There are bibasilar atelectasis. No focal consolidation, pleural effusion, or pneumothorax. The central airways are patent. Upper Abdomen: Probable fatty liver. Musculoskeletal: No chest wall abnormality. No acute or significant osseous findings. Review of the MIP images confirms the above findings. IMPRESSION: 1. Small nodule seen right lower lobe segmental or subsegmental pulmonary artery emboli, age indeterminate. No large or central pulmonary artery embolus. 2. Small pericardial effusion. 3. Aortic Atherosclerosis (ICD10-I70.0). These results were called by telephone at the time of interpretation on 03/22/2020 at 4:15 pm to provider [facility] , who verbally acknowledged these results. Electronically Signed   By: Anner Crete M.D.   On: 03/22/2020 16:16   CT CERVICAL SPINE WO CONTRAST  Result Date: 03/22/2020 CLINICAL DATA:  Neck trauma, uncomplicated. Fall, trauma, neck pain. EXAM: CT CERVICAL SPINE WITHOUT CONTRAST TECHNIQUE: Multidetector CT imaging of the cervical spine was performed without intravenous contrast. Multiplanar CT image reconstructions were also generated. COMPARISON:  No pertinent prior exams are available for comparison. FINDINGS: Alignment: Cervical levocurvature reversal of the expected cervical lordosis. No significant spondylolisthesis. Skull base and vertebrae: The  basion-dental and atlanto-dental intervals are maintained.No evidence of acute fracture to the cervical spine. There are multiple subcentimeter lucent osseous lesions within the cervical spine, for instance  within the C3 and C5 vertebral bodies on image 33 of series 13, within the C2 left articular pillar on image 45 of series 13, and within the C7 right transverse process on series 13, image 22. Soft tissues and spinal canal: No prevertebral fluid or swelling. No visible canal hematoma. Disc levels: Cervical spondylosis. Most notably at C4-C5 there is moderate/advanced disc space narrowing with a bulky posterior disc osteophyte complex which contributes to at least moderate spinal canal stenosis. Upper chest: No consolidation within the imaged lung apices. No visible pneumothorax IMPRESSION: No evidence of acute fracture to the cervical spine. There are several subcentimeter lucent osseous lesions within the cervical spine. These foci are nonspecific and could certainly be a benign. However, osseous metastases cannot be excluded and clinical correlation for any known primary malignancy is recommended. Additionally, multiple myeloma could have a similar imaging appearance and correlation with relevant laboratory values is recommended. Cervical levocurvature and nonspecific reversal of the expected cervical lordosis. At C4-C5, a bulky posterior disc osteophyte contributes to at least moderate spinal canal stenosis, likely with some degree of spinal cord mass effect. Electronically Signed   By: Kellie Simmering DO   On: 03/22/2020 15:44   DG Pelvis Portable  Result Date: 03/22/2020 CLINICAL DATA:  Syncope with fall. EXAM: PORTABLE PELVIS 1-2 VIEWS COMPARISON:  Sacral x-rays dated September 10, 2006. FINDINGS: There is no evidence of pelvic fracture or diastasis. No pelvic bone lesions are seen. Prior L3-L5 fusion. Kissing iliac stents noted. Surgical clips in the right groin. IMPRESSION: 1. Negative. Electronically  Signed   By: Titus Dubin M.D.   On: 03/22/2020 14:56   DG Chest Port 1 View  Result Date: 03/22/2020 CLINICAL DATA:  Syncope with fall. EXAM: PORTABLE CHEST 1 VIEW COMPARISON:  Chest x-ray dated Dec 10, 2019. FINDINGS: The heart size and mediastinal contours are within normal limits. Mild scarring and atelectasis at the peripheral left lung base. No focal consolidation, pleural effusion, or pneumothorax. No acute osseous abnormality. IMPRESSION: 1. No active disease. Electronically Signed   By: Titus Dubin M.D.   On: 03/22/2020 14:54   EEG adult  Result Date: 03/23/2020 Lora Havens, MD     03/23/2020 10:19 AM Patient Name: JAKIRAH ZAUN MRN: [id number] Epilepsy Attending: Lora Havens Referring Physician/Provider: Dr Darliss Cheney Date: 03/22/2020  Duration: 28.10 mins Patient history: 59yo F with syncope and ams. EEG to evaluate for seizure. Level of alertness: Awake AEDs during EEG study: GBP, TPM Technical aspects: This EEG study was done with scalp electrodes positioned according to the 10-20 International system of electrode placement. Electrical activity was acquired at a sampling rate of $Remov'500Hz'VNIMxk$  and reviewed with a high frequency filter of $RemoveB'70Hz'RLRbyjIo$  and a low frequency filter of $RemoveB'1Hz'ZBDXTdex$ . EEG data were recorded continuously and digitally stored. Description: No posterior dominant rhythm was seen. EEG showed continuous generalized 5-8 Hz theta-alpha activity as well as 2-$RemoveB'3hz'TPnCgvZO$  delta slowing was noted. Hyperventilation and photic stimulation were not performed.  Bilateral twitching of upper extremities and eyelid twitching was intermittently noted during study. Concomitant eeg before, during and after eeg didn't show any eeg change to suggest seizure.  ABNORMALITY -Continuous slow, generalized IMPRESSION: This study is suggestive of mild to moderate diffuse encephalopathy, nonspecific etiology. No seizures or epileptiform discharges were seen throughout the recording. Priyanka O Yadav   VAS [place] LOWER  EXTREMITY VENOUS (DVT)  Result Date: 03/23/2020  Lower Venous DVTStudy Indications: Pulmonary embolism.  Comparison Study: no prior Performing Technologist: Abram Sander RVS  Examination Guidelines: A complete evaluation includes B-mode imaging, spectral Doppler, color Doppler, and power Doppler as needed of all accessible portions of each vessel. Bilateral testing is considered an integral part of a complete examination. Limited examinations for reoccurring indications may be performed as noted. The reflux portion of the exam is performed with the patient in reverse Trendelenburg.  +---------+---------------+---------+-----------+----------+--------------+  RIGHT     Compressibility Phasicity Spontaneity Properties Thrombus Aging  +---------+---------------+---------+-----------+----------+--------------+  CFV       Full            Yes       Yes                                    +---------+---------------+---------+-----------+----------+--------------+  SFJ       Full                                                             +---------+---------------+---------+-----------+----------+--------------+  FV Prox   Full                                                             +---------+---------------+---------+-----------+----------+--------------+  FV Mid    Full                                                             +---------+---------------+---------+-----------+----------+--------------+  FV Distal Full                                                             +---------+---------------+---------+-----------+----------+--------------+  PFV       Full                                                             +---------+---------------+---------+-----------+----------+--------------+  POP       Full            Yes       Yes                                    +---------+---------------+---------+-----------+----------+--------------+  PTV       Full                                                              +---------+---------------+---------+-----------+----------+--------------+  PERO      Full                                                             +---------+---------------+---------+-----------+----------+--------------+   +---------+---------------+---------+-----------+----------+--------------+  LEFT      Compressibility Phasicity Spontaneity Properties Thrombus Aging  +---------+---------------+---------+-----------+----------+--------------+  CFV       Full            Yes       Yes                                    +---------+---------------+---------+-----------+----------+--------------+  SFJ       Full                                                             +---------+---------------+---------+-----------+----------+--------------+  FV Prox   Full                                                             +---------+---------------+---------+-----------+----------+--------------+  FV Mid    Full                                                             +---------+---------------+---------+-----------+----------+--------------+  FV Distal Full                                                             +---------+---------------+---------+-----------+----------+--------------+  PFV       Full                                                             +---------+---------------+---------+-----------+----------+--------------+  POP       Full            Yes       Yes                                    +---------+---------------+---------+-----------+----------+--------------+  PTV       Full                                                             +---------+---------------+---------+-----------+----------+--------------+  PERO      Full                                                             +---------+---------------+---------+-----------+----------+--------------+     Summary: BILATERAL: - No evidence of deep vein thrombosis seen in the lower extremities, bilaterally. - No  evidence of superficial venous thrombosis in the lower extremities, bilaterally. -   *See table(s) above for measurements and observations.    Preliminary     Scheduled Meds:  bacitracin   Topical BID   clopidogrel  75 mg Oral Daily   dicyclomine  20 mg Oral TID   escitalopram  20 mg Oral Daily   gabapentin  400 mg Oral QHS   metoprolol tartrate  25 mg Oral BID   pantoprazole  40 mg Oral Daily   rosuvastatin  40 mg Oral Daily   topiramate  100 mg Oral Daily   Continuous Infusions:  sodium chloride 100 mL/hr at 03/23/20 0535   heparin 900 Units/hr (03/23/20 0534)     LOS: 0 days   Time spent: 35 minutes   Darliss Cheney, MD [facility]  03/23/2020, 1:10 PM   To contact the attending provider between 7A-7P or the covering provider during after hours 7P-7A, please log into the web site [email or web address].

## 2020-03-23 NOTE — Progress Notes (Signed)
ANTICOAGULATION CONSULT NOTE  Pharmacy Consult for heparin Indication: pulmonary embolus  No Known Allergies  Patient Measurements: Height: 5\' 2"  (157.5 cm) Weight: 79.4 kg (175 lb) IBW/kg (Calculated) : 50.1 Heparin Dosing Weight: 67.7kg  Vital Signs: BP: 170/98 (08/19 0946) Pulse Rate: 75 (08/19 1245)  Labs: Recent Labs    03/22/20 1444 03/22/20 1444 03/22/20 1459 03/22/20 2244 03/23/20 0406 03/23/20 0421 03/23/20 1247  HGB 15.0   < > 16.7*  --  13.3  --   --   HCT 50.2*  --  49.0*  --  43.8  --   --   PLT 127*  --   --   --  122*  --   --   LABPROT 12.5  --   --   --   --   --   --   INR 1.0  --   --   --   --   --   --   HEPARINUNFRC  --   --   --  0.55  --  0.81* 0.64  CREATININE 1.12*  --  1.10*  --  1.10*  --   --   TROPONINIHS 180*  --   --  380*  --   --  144*   < > = values in this interval not displayed.    Estimated Creatinine Clearance: 55.1 mL/min (A) (by C-G formula based on SCr of 1.1 mg/dL (H)).   Assessment: 73 yof presented s/p syncopal episode and fall. Found to have a small PE and started on IV heparin.  CT head negative for intracranial bleed but pt does have a scalp lac with hematoma.   Heparin level trended down to therapeutic level.  No bleeding reported.  Goal of Therapy:  Heparin level 0.3-0.7 units/ml Monitor platelets by anticoagulation protocol: Yes   Plan:  Reduce heparin gtt slightly to 850 units/hr Daily heparin level and CBC  Danijela Vessey D. 81, PharmD, BCPS, BCCCP 03/23/2020, 1:51 PM

## 2020-03-23 NOTE — Consult Note (Addendum)
Cardiology Consultation:   Patient ID: Selena Clark; [id number]; [birth year]   Admit date: 03/22/2020 Date of Consult: 03/23/2020  Primary Care Provider: Kateri Mc, MD Primary Cardiologist: Helene Kelp, MD Primary Electrophysiologist:  None   Patient Profile:   Selena Clark is a 59 y.o. female with a PMH of CAD s/p CABG, PAD x/p bilateral LE stenting to unknown vessels (stable on dopplers 08/2018), HTN, HLD, IBS, anxiety, and tobacco abuse, who is being seen today for the evaluation of syncope and elevated troponins at the request of Dr. Doristine Bosworth.  History of Present Illness:   Selena Clark was in her usual state of health 03/22/20 when she experienced an unwitnessed syncopal episode in the bathroom while washing her hands following a bowel movement. Patient does not recall any prodromal symptoms of chest pain, palpitations, dizziness, lightheadedness, SOB, nausea, or vomiting. She regained consciousness in <1 minute however had some lingering confusion prompting EMS activation. She continued to have some confusion on arrival to the ED and was reportedly unable to state the year/month and kept repeating herself. CT Head, C-spine, CXR, and pelvic XR were without acute findings. She was noted to be hypoxic with O2 sats in the 50s on arrival. CT Chest showed 2 small subsegmental RLL PE's. LE dopplers without DVT. HsTrops were elevated from 180>380>144. EKG with sinus rhythm, rate 114 bpm, non-specific T wave abnormalities, no STE/D, Qtc 507; no significant change from previous (alternate MRN number). Echo this admission with EF 60-65%, G1DD, no RWMA, and no significant valvular abnormalities. Cardiology asked to evaluate for syncope and elevated troponins.   At the time of this evaluation she reports improvement in symptoms. She describes several episodes of syncope in the past, most recently 1 week ago, which always occur shortly after position changes - walking down the hallway after  waking up or getting off the toilet following a BM. She reports feeling weakness in her legs prior to passing out. She frequently struggles with dizziness. She denies any chest pain, SOB, or palpitations in recent weeks/months. She reports doing well from a cardiac standpoint since her CABG in 2007 and has not required any additional cardiac stenting since that time. No complaints of LE edema, orthopnea, or PND. She is still smoking 1ppd.    Past Medical History:  Diagnosis Date  . CAD (coronary artery disease)   . HTN (hypertension)   . Hx of CABG     Past Surgical History:  Procedure Laterality Date  . CORONARY ARTERY BYPASS GRAFT       Home Medications:  Prior to Admission medications   Medication Sig Start Date End Date Taking? Authorizing Provider  clopidogrel (PLAVIX) 75 MG tablet Take 75 mg by mouth daily. 03/06/20  Yes [provider]  dicyclomine (BENTYL) 20 MG tablet Take 20 mg by mouth 3 (three) times daily. 02/20/20  Yes [provider]  escitalopram (LEXAPRO) 20 MG tablet Take 20 mg by mouth daily. 03/22/20  Yes [provider]  gabapentin (NEURONTIN) 400 MG capsule Take 400 mg by mouth in the morning, at noon, and at bedtime. 02/29/20  Yes [provider]  metoprolol tartrate (LOPRESSOR) 25 MG tablet Take 25 mg by mouth 2 (two) times daily. 03/16/20  Yes [provider]  pantoprazole (PROTONIX) 40 MG tablet Take 40 mg by mouth daily. 02/23/20  Yes [provider]  rosuvastatin (CRESTOR) 40 MG tablet Take 40 mg by mouth daily. 01/25/20  Yes [provider]  tiZANidine (ZANAFLEX) 4 MG  tablet Take 4 mg by mouth 3 (three) times daily. 02/20/20  Yes [provider]  topiramate (TOPAMAX) 100 MG tablet Take 100 mg by mouth daily. 03/07/20  Yes [provider]  traZODone (DESYREL) 100 MG tablet Take 200 mg by mouth at bedtime as needed for sleep. 03/02/20  Yes [provider]  XTAMPZA ER 13.5 MG C12A Take  1 capsule by mouth 2 (two) times daily. 10/08/19  Yes [provider]  XTAMPZA ER 18 MG C12A Take 1 capsule by mouth 2 (two) times daily. 03/20/20  Yes [provider]    Inpatient Medications: Scheduled Meds: . bacitracin   Topical BID  . clopidogrel  75 mg Oral Daily  . dicyclomine  20 mg Oral TID  . escitalopram  20 mg Oral Daily  . gabapentin  400 mg Oral QHS  . metoprolol tartrate  25 mg Oral BID  . pantoprazole  40 mg Oral Daily  . rosuvastatin  40 mg Oral Daily  . topiramate  100 mg Oral Daily   Continuous Infusions: . sodium chloride 100 mL/hr at 03/23/20 1450  . heparin 850 Units/hr (03/23/20 1448)   PRN Meds: acetaminophen **OR** acetaminophen, ondansetron **OR** ondansetron (ZOFRAN) IV, traZODone  Allergies:   No Known Allergies  Social History:   Social History   Socioeconomic History  . Marital status: Single    Spouse name: Not on file  . Number of children: Not on file  . Years of education: Not on file  . Highest education level: Not on file  Occupational History  . Not on file  Tobacco Use  . Smoking status: Not on file  Substance and Sexual Activity  . Alcohol use: Not on file  . Drug use: Not on file  . Sexual activity: Not on file  Other Topics Concern  . Not on file  Social History Narrative  . Not on file   Social Determinants of Health   Financial Resource Strain:   . Difficulty of Paying Living Expenses: Not on file  Food Insecurity:   . Worried About Charity fundraiser in the Last Year: Not on file  . Ran Out of Food in the Last Year: Not on file  Transportation Needs:   . Lack of Transportation (Medical): Not on file  . Lack of Transportation (Non-Medical): Not on file  Physical Activity:   . Days of Exercise per Week: Not on file  . Minutes of Exercise per Session: Not on file  Stress:   . Feeling of Stress : Not on file  Social Connections:   . Frequency of Communication with Friends and Family: Not on file  .  Frequency of Social Gatherings with Friends and Family: Not on file  . Attends Religious Services: Not on file  . Active Member of Clubs or Organizations: Not on file  . Attends Archivist Meetings: Not on file  . Marital Status: Not on file  Intimate Partner Violence:   . Fear of Current or Ex-Partner: Not on file  . Emotionally Abused: Not on file  . Physically Abused: Not on file  . Sexually Abused: Not on file    Family History:    Family History  Problem Relation Age of Onset  . Hypertension Mother      ROS:  Please see the history of present illness.  Review of Systems  All other systems reviewed and are negative.   All other ROS reviewed and negative.     Physical  Exam/Data:   Vitals:   03/23/20 1115 03/23/20 1145 03/23/20 1245 03/23/20 1430  BP:    (!) 163/95  Pulse: 77 77 75 82  Resp: $Remo'17 14 12 18  'QWyMF$ Temp:    98.1 F (36.7 C)  TempSrc:    Oral  SpO2: 95% 97% 97% 100%  Weight:      Height:       No intake or output data in the 24 hours ending 03/23/20 1458 Filed Weights   03/22/20 1650  Weight: 79.4 kg   Body mass index is 32.01 kg/m.  General:  Well nourished, well developed, laying in bed in no acute distress HEENT: sclera anicteric  Neck: no JVD Vascular: No carotid bruits; distal pulses 2+ bilaterally Cardiac:  normal S1, S2; RRR; no murmurs, rubs, or gallops Lungs:  clear to auscultation bilaterally, no wheezing, rhonchi or rales  Abd: NABS, soft, obese, nontender, no hepatomegaly Ext: no edema Musculoskeletal:  No deformities, BUE and BLE strength normal and equal Skin: warm and dry  Neuro:  CNs 2-12 intact, no focal abnormalities noted Psych:  Normal affect   EKG:  The EKG was personally reviewed and demonstrates:  sinus rhythm, rate 114 bpm, non-specific T wave abnormalities, no STE/D, Qtc 507; no significant change from previous (alternate MRN number).  Telemetry:  Telemetry was personally reviewed and demonstrates:  Sinus rhythm  with occasional PVCs  Relevant CV Studies: Echocardiogram 03/22/20: 1. Left ventricular ejection fraction, by estimation, is 60 to 65%. The  left ventricle has normal function. The left ventricle has no regional  wall motion abnormalities. Left ventricular diastolic parameters are  consistent with Grade I diastolic  dysfunction (impaired relaxation).  2. Right ventricular systolic function is normal. The right ventricular  size is normal.  3. The mitral valve is normal in structure. No evidence of mitral valve  regurgitation. No evidence of mitral stenosis.  4. The aortic valve is tricuspid. Aortic valve regurgitation is not  visualized. No aortic stenosis is present.  5. The inferior vena cava is normal in size with greater than 50%  respiratory variability, suggesting right atrial pressure of 3 mmHg.   Laboratory Data:  Chemistry Recent Labs  Lab 03/22/20 1444 03/22/20 1459 03/23/20 0406  NA 142 144 141  K 4.4 4.1 3.6  CL 107 104 109  CO2 27  --  26  GLUCOSE 102* 96 86  BUN $Re'10 13 9  'aMd$ CREATININE 1.12* 1.10* 1.10*  CALCIUM 9.0  --  8.4*  GFRNONAA 54*  --  56*  GFRAA >60  --  >60  ANIONGAP 8  --  6    Recent Labs  Lab 03/22/20 1444  PROT 5.5*  ALBUMIN 2.7*  AST 18  ALT 9  ALKPHOS 68  BILITOT 0.3   Hematology Recent Labs  Lab 03/22/20 1444 03/22/20 1459 03/23/20 0406  WBC 5.6  --  5.2  RBC 4.91  --  4.32  HGB 15.0 16.7* 13.3  HCT 50.2* 49.0* 43.8  MCV 102.2*  --  101.4*  MCH 30.5  --  30.8  MCHC 29.9*  --  30.4  RDW 17.5*  --  17.2*  PLT 127*  --  122*   Cardiac EnzymesNo results for input(s): TROPONINI in the last 168 hours. No results for input(s): TROPIPOC in the last 168 hours.  BNPNo results for input(s): BNP, PROBNP in the last 168 hours.  DDimer No results for input(s): DDIMER in the last 168 hours.  Radiology/Studies:  CT HEAD WO CONTRAST  Result Date: 03/22/2020 CLINICAL DATA:  Syncope EXAM: CT HEAD WITHOUT CONTRAST TECHNIQUE: Contiguous  axial images were obtained from the base of the skull through the vertex without intravenous contrast. COMPARISON:  None. FINDINGS: Brain: No evidence of acute infarction, hemorrhage, hydrocephalus, extra-axial collection or mass lesion/mass effect. Vascular: No hyperdense vessel or unexpected calcification. Skull: No osseous abnormality. Sinuses/Orbits: Visualized paranasal sinuses are clear. Visualized mastoid sinuses are clear. Visualized orbits demonstrate no focal abnormality. Other: Left parietal scalp laceration with hematoma. IMPRESSION: 1. No acute intracranial pathology. 2. Left parietal scalp laceration with hematoma. Electronically Signed   By: Kathreen Devoid   On: 03/22/2020 15:48   CT Angio Chest PE W and/or Wo Contrast  Result Date: 03/22/2020 CLINICAL DATA:  59 year old female with syncope. Concern for pulmonary embolism. EXAM: CT ANGIOGRAPHY CHEST WITH CONTRAST TECHNIQUE: Multidetector CT imaging of the chest was performed using the standard protocol during bolus administration of intravenous contrast. Multiplanar CT image reconstructions and MIPs were obtained to evaluate the vascular anatomy. CONTRAST:  101mL OMNIPAQUE IOHEXOL 350 MG/ML SOLN COMPARISON:  Chest CT dated 08/26/2006. FINDINGS: Cardiovascular: There is no cardiomegaly. Small pericardial effusion. There is mild atherosclerotic calcification of the thoracic aorta. Evaluation of the pulmonary arteries is somewhat limited due to respiratory motion artifact. Small right lower lobe segmental or subsegmental nonocclusive pulmonary artery emboli noted, age indeterminate. No large or central pulmonary artery embolus. No CT evidence of right heart straining. Mediastinum/Nodes: There is no hilar or mediastinal adenopathy. The esophagus and the thyroid gland are grossly unremarkable. No mediastinal fluid collection. Lungs/Pleura: There are bibasilar atelectasis. No focal consolidation, pleural effusion, or pneumothorax. The central airways are  patent. Upper Abdomen: Probable fatty liver. Musculoskeletal: No chest wall abnormality. No acute or significant osseous findings. Review of the MIP images confirms the above findings. IMPRESSION: 1. Small nodule seen right lower lobe segmental or subsegmental pulmonary artery emboli, age indeterminate. No large or central pulmonary artery embolus. 2. Small pericardial effusion. 3. Aortic Atherosclerosis (ICD10-I70.0). These results were called by telephone at the time of interpretation on 03/22/2020 at 4:15 pm to provider [facility] , who verbally acknowledged these results. Electronically Signed   By: Anner Crete M.D.   On: 03/22/2020 16:16   CT CERVICAL SPINE WO CONTRAST  Result Date: 03/22/2020 CLINICAL DATA:  Neck trauma, uncomplicated. Fall, trauma, neck pain. EXAM: CT CERVICAL SPINE WITHOUT CONTRAST TECHNIQUE: Multidetector CT imaging of the cervical spine was performed without intravenous contrast. Multiplanar CT image reconstructions were also generated. COMPARISON:  No pertinent prior exams are available for comparison. FINDINGS: Alignment: Cervical levocurvature reversal of the expected cervical lordosis. No significant spondylolisthesis. Skull base and vertebrae: The basion-dental and atlanto-dental intervals are maintained.No evidence of acute fracture to the cervical spine. There are multiple subcentimeter lucent osseous lesions within the cervical spine, for instance within the C3 and C5 vertebral bodies on image 33 of series 13, within the C2 left articular pillar on image 45 of series 13, and within the C7 right transverse process on series 13, image 22. Soft tissues and spinal canal: No prevertebral fluid or swelling. No visible canal hematoma. Disc levels: Cervical spondylosis. Most notably at C4-C5 there is moderate/advanced disc space narrowing with a bulky posterior disc osteophyte complex which contributes to at least moderate spinal canal stenosis. Upper chest: No consolidation  within the imaged lung apices. No visible pneumothorax IMPRESSION: No evidence of acute fracture to the cervical spine. There are several subcentimeter lucent osseous lesions within the cervical spine. These foci  are nonspecific and could certainly be a benign. However, osseous metastases cannot be excluded and clinical correlation for any known primary malignancy is recommended. Additionally, multiple myeloma could have a similar imaging appearance and correlation with relevant laboratory values is recommended. Cervical levocurvature and nonspecific reversal of the expected cervical lordosis. At C4-C5, a bulky posterior disc osteophyte contributes to at least moderate spinal canal stenosis, likely with some degree of spinal cord mass effect. Electronically Signed   By: Kellie Simmering DO   On: 03/22/2020 15:44   DG Pelvis Portable  Result Date: 03/22/2020 CLINICAL DATA:  Syncope with fall. EXAM: PORTABLE PELVIS 1-2 VIEWS COMPARISON:  Sacral x-rays dated September 10, 2006. FINDINGS: There is no evidence of pelvic fracture or diastasis. No pelvic bone lesions are seen. Prior L3-L5 fusion. Kissing iliac stents noted. Surgical clips in the right groin. IMPRESSION: 1. Negative. Electronically Signed   By: Titus Dubin M.D.   On: 03/22/2020 14:56   DG Chest Port 1 View  Result Date: 03/22/2020 CLINICAL DATA:  Syncope with fall. EXAM: PORTABLE CHEST 1 VIEW COMPARISON:  Chest x-ray dated Dec 10, 2019. FINDINGS: The heart size and mediastinal contours are within normal limits. Mild scarring and atelectasis at the peripheral left lung base. No focal consolidation, pleural effusion, or pneumothorax. No acute osseous abnormality. IMPRESSION: 1. No active disease. Electronically Signed   By: Titus Dubin M.D.   On: 03/22/2020 14:54   EEG adult  Result Date: 03/23/2020 Lora Havens, MD     03/23/2020 10:19 AM Patient Name: CARINNE BRANDENBURGER MRN: [id number] Epilepsy Attending: Lora Havens Referring  Physician/Provider: Dr Darliss Cheney Date: 03/22/2020  Duration: 28.10 mins Patient history: 59yo F with syncope and ams. EEG to evaluate for seizure. Level of alertness: Awake AEDs during EEG study: GBP, TPM Technical aspects: This EEG study was done with scalp electrodes positioned according to the 10-20 International system of electrode placement. Electrical activity was acquired at a sampling rate of $Remov'500Hz'vraleR$  and reviewed with a high frequency filter of $RemoveB'70Hz'YQAvIhvZ$  and a low frequency filter of $RemoveB'1Hz'KadliOjt$ . EEG data were recorded continuously and digitally stored. Description: No posterior dominant rhythm was seen. EEG showed continuous generalized 5-8 Hz theta-alpha activity as well as 2-$RemoveB'3hz'skrCSzQm$  delta slowing was noted. Hyperventilation and photic stimulation were not performed.  Bilateral twitching of upper extremities and eyelid twitching was intermittently noted during study. Concomitant eeg before, during and after eeg didn't show any eeg change to suggest seizure.  ABNORMALITY -Continuous slow, generalized IMPRESSION: This study is suggestive of mild to moderate diffuse encephalopathy, nonspecific etiology. No seizures or epileptiform discharges were seen throughout the recording. Priyanka O Yadav   VAS [place] LOWER EXTREMITY VENOUS (DVT)  Result Date: 03/23/2020  Lower Venous DVTStudy Indications: Pulmonary embolism.  Comparison Study: no prior Performing Technologist: Abram Sander RVS  Examination Guidelines: A complete evaluation includes B-mode imaging, spectral Doppler, color Doppler, and power Doppler as needed of all accessible portions of each vessel. Bilateral testing is considered an integral part of a complete examination. Limited examinations for reoccurring indications may be performed as noted. The reflux portion of the exam is performed with the patient in reverse Trendelenburg.  +---------+---------------+---------+-----------+----------+--------------+ RIGHT     CompressibilityPhasicitySpontaneityPropertiesThrombus Aging +---------+---------------+---------+-----------+----------+--------------+ CFV      Full           Yes      Yes                                 +---------+---------------+---------+-----------+----------+--------------+  SFJ      Full                                                        +---------+---------------+---------+-----------+----------+--------------+ FV Prox  Full                                                        +---------+---------------+---------+-----------+----------+--------------+ FV Mid   Full                                                        +---------+---------------+---------+-----------+----------+--------------+ FV DistalFull                                                        +---------+---------------+---------+-----------+----------+--------------+ PFV      Full                                                        +---------+---------------+---------+-----------+----------+--------------+ POP      Full           Yes      Yes                                 +---------+---------------+---------+-----------+----------+--------------+ PTV      Full                                                        +---------+---------------+---------+-----------+----------+--------------+ PERO     Full                                                        +---------+---------------+---------+-----------+----------+--------------+   +---------+---------------+---------+-----------+----------+--------------+ LEFT     CompressibilityPhasicitySpontaneityPropertiesThrombus Aging +---------+---------------+---------+-----------+----------+--------------+ CFV      Full           Yes      Yes                                 +---------+---------------+---------+-----------+----------+--------------+ SFJ      Full                                                         +---------+---------------+---------+-----------+----------+--------------+  FV Prox  Full                                                        +---------+---------------+---------+-----------+----------+--------------+ FV Mid   Full                                                        +---------+---------------+---------+-----------+----------+--------------+ FV DistalFull                                                        +---------+---------------+---------+-----------+----------+--------------+ PFV      Full                                                        +---------+---------------+---------+-----------+----------+--------------+ POP      Full           Yes      Yes                                 +---------+---------------+---------+-----------+----------+--------------+ PTV      Full                                                        +---------+---------------+---------+-----------+----------+--------------+ PERO     Full                                                        +---------+---------------+---------+-----------+----------+--------------+     Summary: BILATERAL: - No evidence of deep vein thrombosis seen in the lower extremities, bilaterally. - No evidence of superficial venous thrombosis in the lower extremities, bilaterally. -   *See table(s) above for measurements and observations. Electronically signed by Lemar Livings MD on 03/23/2020 at 2:48:17 PM.    Final     Assessment and Plan:   1. Syncope: patient presented following a syncopal episode in the bathroom while washing her hands following a bowel movement. She had some confusion after regaining consciousness. No prodromal symptoms. EKG is non-ischemic with sinus tachycardia. Subsequently found to have hypoxia with O2 sats int he 50s on arrival to the ED requiring a NRB, with evidence of 2 small subsegmental RLL PE's on CTA Chest. CT head was negative and  scalp laceration was repaired in the ED. EEG was negative for seizures. Likely syncope is multifactorial with some vasovagal/orthostatic component, as well as newly diagnosed PE with associated hypoxia, and probable polypharmacy. Echo reassuring with EF 60-65%, G1DD, no RWMA,  and no right heart strain. She does have a longstanding history of dizziness with frequent falls.  - Would check orthostatic vitals - Encourage compression stockings and abdominal binder use to prevent orthostatic symptoms.  - Could consider an outpatient cardiac monitor if recurrent symptoms to evaluate for arrhythmias  2. Elevated troponins in patient with history of CAD s/p CABG: patient presented with syncope likely related to PE diagnosed this admission with possible vasovagal/orthostatic component. No complaints of chest pain or SOB peri-syncopal period. EKG is non-ischemic, HsTrop elevated to 380 and trended down, Echo with EF 60-65% with no RWMA. Suspect demand ischemia in the setting of PE  - No further inpatient cardiac work-up at this time - Continue plavix and statin - Continue BBlocker  3. Acute respiratory failure 2/2 PE: patient presented with syncope and hypoxia, found to have 2 small subsegmental PE's on CTA Chest. Felt that the degree of hypoxia was likely not explained by her small PE's. Started on a heparin gtt. Echo with EF 60-65%, no RWMA, no right heart strain, and no pericardial effusion.  - Continue management per primary team.  4. CKD stage 3: Cr 1.1 - appears to be baseline based on alternate MRN lab review.  - Continue to monitor  5. HTN: BP intermittently elevated.  - Continue metoprolol for now   6. HLD: LDL 29 05/2019; at goal of <70 - Continue statin  7. Tobacco abuse: smokes 1ppd - Continue to encourage smoking cessation     For questions or updates, please contact [facility] Please consult [email or web address] for contact info under Cardiology/STEMI.   Signed, Abigail Butts,  PA-C  03/23/2020 2:58 PM [phone]  Personally seen and examined. Agree with above.   59 year old female here with syncope, fell backwards, laceration after getting up from seated position.  Had previous syncopal episode after getting up, walking down hallway, fell backwards.  Prior history of CAD and CABG.  CT scan showed subsegmental PE x2.  No adverse arrhythmias on telemetry.  Echocardiogram personally reviewed shows normal EF without any evidence of right heart strain or pericardial effusion.  Continues to smoke 1 pack a day.  Currently comfortable laying in bed.   GEN: Well nourished, well developed, in no acute distress  HEENT: normal  Neck: no JVD, carotid bruits, or masses Cardiac: RRR; no murmurs, rubs, or gallops,no edema  Respiratory:  clear to auscultation bilaterally, normal work of breathing GI: soft, nontender, nondistended, + BS MS: no deformity or atrophy  Skin: warm and dry, no rash Neuro:  Alert and Oriented x 3, Strength and sensation are intact Psych: euthymic mood, full affect  Assessment and plan:  Recurrent syncope CAD post CABG remotely Labile hypertension -Her prior episodes seem to occur with positional changes, i.e. orthostatic hypotensive episodes.  Certainly this could have occurred after her bowel movement, hyper vagal response.  Subsegmental PEs sometimes contribute as well to a hyper vagal response and can lead to syncope, however this does not explain her prior episode.  Her blood pressure seems to be quite labile. -We will check orthostatics -Echocardiogram reassuring structure and function. -Telemetry thus far is reassuring as well. -EKG does show prolonged QTC of approximately 500 ms.  Once again, on telemetry she is not showing any signs of ventricular tachycardia. -Try to avoid other QT prolonging agents such as Zofran, quinolone antibiotics. -Treating PEs with IV heparin.  Transition to [place] when able.  At that time, I would recommend  discontinuing Plavix.  Her underlying CAD is stable. -  I think it makes sense for [place] to set her up for an outpatient event monitor to ensure that there are no adverse arrhythmias present. -Mildly elevated troponin is likely secondary to underlying PE.  She is not having any chest discomfort, no evidence of acute coronary syndrome present.  Candee Furbish, MD

## 2020-03-23 NOTE — ED Notes (Signed)
Ordered [place] °

## 2020-03-23 NOTE — Progress Notes (Signed)
Lower extremity venous has been completed.   Preliminary results in CV Proc.   Blanch Media 03/23/2020 8:38 AM

## 2020-03-23 NOTE — Progress Notes (Addendum)
ANTICOAGULATION CONSULT NOTE  Pharmacy Consult for heparin Indication: pulmonary embolus  No Known Allergies  Patient Measurements: Height: 5\' 2"  (157.5 cm) Weight: 79.4 kg (175 lb) IBW/kg (Calculated) : 50.1 Heparin Dosing Weight: 67.7kg  Vital Signs: BP: 136/89 (08/19 0315) Pulse Rate: 84 (08/19 0315)  Labs: Recent Labs    03/22/20 1444 03/22/20 1459 03/22/20 2244  HGB 15.0 16.7*  --   HCT 50.2* 49.0*  --   PLT 127*  --   --   LABPROT 12.5  --   --   INR 1.0  --   --   HEPARINUNFRC  --   --  0.55  CREATININE 1.12* 1.10*  --   TROPONINIHS 180*  --  380*    Estimated Creatinine Clearance: 55.1 mL/min (A) (by C-G formula based on SCr of 1.1 mg/dL (H)).   Assessment: 59 y.o. female with PE for heparin  Goal of Therapy:  Heparin level 0.3-0.7 units/ml Monitor platelets by anticoagulation protocol: Yes   Plan:  Continue Heparin at current rate   Selena Clark, 60 03/23/2020,3:57 AM   Addendum: Recheck level 0.81 with AM labs Decrease Heparin 900 units/hr F/U plan for oral anticoagulation  03/25/2020, PharmD, BCPS  03/23/2020 5:32 AM

## 2020-03-24 ENCOUNTER — Other Ambulatory Visit: Payer: Self-pay | Admitting: Cardiology

## 2020-03-24 DIAGNOSIS — R55 Syncope and collapse: Secondary | ICD-10-CM

## 2020-03-24 LAB — CBC
HCT: 44.8 % (ref 36.0–46.0)
Hemoglobin: 13.8 g/dL (ref 12.0–15.0)
MCH: 30 pg (ref 26.0–34.0)
MCHC: 30.8 g/dL (ref 30.0–36.0)
MCV: 97.4 fL (ref 80.0–100.0)
Platelets: 121 10*3/uL — ABNORMAL LOW (ref 150–400)
RBC: 4.6 MIL/uL (ref 3.87–5.11)
RDW: 16.3 % — ABNORMAL HIGH (ref 11.5–15.5)
WBC: 4.3 10*3/uL (ref 4.0–10.5)
nRBC: 0 % (ref 0.0–0.2)

## 2020-03-24 LAB — GLUCOSE, CAPILLARY: Glucose-Capillary: 98 mg/dL (ref 70–99)

## 2020-03-24 LAB — HEPARIN LEVEL (UNFRACTIONATED): Heparin Unfractionated: 0.4 IU/mL (ref 0.30–0.70)

## 2020-03-24 MED ORDER — OXYCODONE HCL 5 MG PO TABS: 2.5000 mg | ORAL_TABLET | Freq: Four times a day (QID) | ORAL | Status: DC | PRN

## 2020-03-24 MED ORDER — APIXABAN 5 MG PO TABS
10.0000 mg | ORAL_TABLET | Freq: Two times a day (BID) | ORAL | Status: DC
  Administered 2020-03-24 – 2020-03-25 (×3): 10 mg via ORAL
  Filled 2020-03-24 (×3): qty 2

## 2020-03-24 MED ORDER — AMLODIPINE BESYLATE 5 MG PO TABS
5.0000 mg | ORAL_TABLET | Freq: Every day | ORAL | Status: DC
  Administered 2020-03-24 – 2020-03-25 (×2): 5 mg via ORAL
  Filled 2020-03-24 (×3): qty 1

## 2020-03-24 MED ORDER — APIXABAN 5 MG PO TABS: 5.0000 mg | ORAL_TABLET | Freq: Two times a day (BID) | ORAL | Status: DC

## 2020-03-24 NOTE — Plan of Care (Signed)
  Problem: Education: Goal: Knowledge of General Education information will improve Description Including pain rating scale, medication(s)/side effects and non-pharmacologic comfort measures Outcome: Progressing   

## 2020-03-24 NOTE — Progress Notes (Addendum)
PROGRESS NOTE    [name]  KZL:[id number] DOB: 1960-09-23 DOA: 03/22/2020 PCP: Kateri Mc, MD   Brief Narrative:  Selena Clark is a 59 y.o. female with medical history significant of CAD s/p remote CABG on Plavix, IBS, anxiety, hypertension and hyperlipidemia was brought into the ED due to concern of passing out.    Reportedly, patient was in the bathroom having a bowel movement and right she was washing her hands at the sink, she suddenly passed out.  He was in the room and heard the noise so he attended her.  He found her on the floor.  She was unconscious however soon within a minute, she regained her consciousness but was confused.  EMS was called.  She was brought into the ED.  According to patient, she did not have any prodromal symptoms prior to this episode.  According to the husband, she keeps falling and falls approximately twice a month but has never sought any medical attention before.  He thinks that she is overmedicated.  Upon arrival to ED, she was hypoxic with oxygen saturation in 50s.  Required nonrebreather and soon was transitioned to nasal cannula. CT angiogram of the chest was done which ruled out large PE however was found to have 2 small subsegmental right lower lobe PEs.  Extensive imaging studies including chest x-ray, pelvic x-ray, CT cervical spine and CT head were all unremarkable. Slightly elevated lactic acid of 2.6.  Troponin elevated at 180.  Also has thrombocytopenia with platelets of 127.  [facility] was called to admit.  Upon my evaluation, patient was still confused.  Troponins 180> 380> 144.  Cardiology was consulted.  Transthoracic echo showed normal ejection fraction with no wall motion abnormality and grade 1 diastolic dysfunction.  Cardiology opined that her elevated troponins were likely secondary to PE.  She was started on heparin drip from ED for her PE.  Her syncope was thought to be multifactorial, likely vasovagal since she just had a bowel  movement, recurrent orthostatic and polypharmacy.  Cardiology recommended outpatient event monitor.  Slowly, she became more alert and now back to baseline.  EEG was negative for any seizure activity.  Doppler lower extremity negative for any DVT.  She was weaned down to room air.  Please note that she also came in with lactic acidosis and AKI which was likely secondary to orthostatic hypotension, all of these have resolved.  Her blood pressure initially was elevated for which she was started on amlodipine but later on it was thought that her elevated blood pressure was likely secondary to holding her opioids that she takes at home on heavy dose and it was recommended by cardiology not to treat her hypertension aggressively thus amlodipine was discontinued at the time of discharge.  Assessment & Plan:   Active Problems:   Acute respiratory failure with hypoxia (HCC)   Acute pulmonary embolism (HCC)   Elevated troponin   Laceration of head   Syncope and collapse   AKI (acute kidney injury) ([place])   Acute metabolic encephalopathy: Source remains unclear.  CT head was unremarkable.  EEG negative for any seizure.  No electrolyte abnormalities and no infection.  However patient has improved and she is completely back to being more alert and oriented at baseline today.  Syncope: Her syncope is multifactorial.  It is likely vasovagal as she just had a bowel movement before the syncope happened but could very well be cardiac.  EEG negative for any seizures.  Echo negative  for any valvular heart disease.  Normal ejection fraction.  Grade 1 diastolic dysfunction.  She is also on combination of trazodone, Topamax, Zanaflex and Xtampza.  Polypharmacy is also a potential cause of her syncope.  I am holding almost all of those.  Cardiology plans to do cardiac event monitor as outpatient.  Check orthostatics.  She became diaphoretic and lightheaded when working with PT today.  Elevated troponin with history of  CAD and open heart surgery: 180> 380> 144.  Patient with no chest pain or shortness of breath.  Echo results as above.  Seen by cardiology.  They think her troponin were elevated likely due to PE.  No further work-up recommended.   Acute respiratory failure with hypoxia secondary to acute PE: As mentioned above, CT angiogram of the chest shows right lower lobe acute PE, small though and not enough to cause hypoxia.  She is now on room air.  We will switch her to Eliquis from heparin today.  Discussed pros and cons of anticoagulation with the patient 1 more time and she agrees with this plan.  AKI/lactic acidosis: Likely due to dehydration/prerenal.  Now resolved.  Discontinue IV fluids.  Scalp laceration: S/p stitches in the ED.  CT head negative.  Pericardial effusion: CT chest shows small pericardial effusion.  Echo shows no pericardial effusion.  Hyperlipidemia: Continue home dose of statin.  Anxiety: Continue home dose of medications.  Essential hypertension: Very labile and mostly elevated.  Started on amlodipine 5 mg today.  She became diaphoretic and lightheaded even before this was given.  No vitals were checked when this event happened.   DVT prophylaxis:    Code Status: Full Code  Family Communication:  None present at bedside.  Plan of care discussed with patient in length and she verbalized understanding and agreed with it.  Status is: Inpatient  Remains inpatient appropriate because:Inpatient level of care appropriate due to severity of illness   Dispo: The patient is from: Home              Anticipated d/c is to: Home              Anticipated d/c date is: 1 day              Patient currently is not medically stable to d/c.  Patient became symptomatic and had diaphoresis and lightheadedness when working with PT.        Estimated body mass index is 34.61 kg/m as calculated from the following:   Height as of this encounter: $RemoveBeforeD'5\' 2"'WjvBnUSWBmqoPq$  (1.575 m).   Weight as of this  encounter: 85.8 kg.      Nutritional status:               Consultants:   Cardiology,  Procedures:   None  Antimicrobials:  Anti-infectives (From admission, onward)   None         Subjective: Patient seen and examined prior to working with PT.  She was completely alert and oriented and without symptoms.  She in fact jokingly said " I feel better that you".   Objective: Vitals:   03/24/20 0129 03/24/20 0529 03/24/20 0744 03/24/20 0858  BP: (!) 150/93 (!) 176/105 (!) 151/104 (!) 153/94  Pulse: 79 79 80 82  Resp: $Remo'19 19 18   'HXNLj$ Temp: 99.8 F (37.7 C) 99.2 F (37.3 C) 99 F (37.2 C)   TempSrc: Oral Oral Oral   SpO2: 98% 98% 96% 97%  Weight:  85.8 kg  Height:        Intake/Output Summary (Last 24 hours) at 03/24/2020 1053 Last data filed at 03/24/2020 0900 Gross per 24 hour  Intake 2079.2 ml  Output 1202 ml  Net 877.2 ml   Filed Weights   03/22/20 1650 03/24/20 0529  Weight: 79.4 kg 85.8 kg    Examination:  General exam: Appears calm and comfortable  Respiratory system: Clear to auscultation. Respiratory effort normal. Cardiovascular system: S1 & S2 heard, RRR. No JVD, murmurs, rubs, gallops or clicks. No pedal edema. Gastrointestinal system: Abdomen is nondistended, soft and nontender. No organomegaly or masses felt. Normal bowel sounds heard. Central nervous system: Alert and oriented. No focal neurological deficits. Extremities: Symmetric 5 x 5 power. Skin: No rashes, lesions or ulcers.  Psychiatry: Judgement and insight appear normal. Mood & affect appropriate.    Data Reviewed: I have personally reviewed following labs and imaging studies  CBC: Recent Labs  Lab 03/22/20 1444 03/22/20 1459 03/23/20 0406 03/24/20 0514  WBC 5.6  --  5.2 4.3  HGB 15.0 16.7* 13.3 13.8  HCT 50.2* 49.0* 43.8 44.8  MCV 102.2*  --  101.4* 97.4  PLT 127*  --  122* 169*   Basic Metabolic Panel: Recent Labs  Lab 03/22/20 1444 03/22/20 1459 03/22/20 2244  03/23/20 0406  NA 142 144  --  141  K 4.4 4.1  --  3.6  CL 107 104  --  109  CO2 27  --   --  26  GLUCOSE 102* 96  --  86  BUN 10 13  --  9  CREATININE 1.12* 1.10*  --  1.10*  CALCIUM 9.0  --   --  8.4*  MG  --   --  1.7  --    GFR: Estimated Creatinine Clearance: 57.4 mL/min (A) (by C-G formula based on SCr of 1.1 mg/dL (H)). Liver Function Tests: Recent Labs  Lab 03/22/20 1444  AST 18  ALT 9  ALKPHOS 68  BILITOT 0.3  PROT 5.5*  ALBUMIN 2.7*   No results for input(s): LIPASE, AMYLASE in the last 168 hours. No results for input(s): AMMONIA in the last 168 hours. Coagulation Profile: Recent Labs  Lab 03/22/20 1444  INR 1.0   Cardiac Enzymes: No results for input(s): CKTOTAL, CKMB, CKMBINDEX, TROPONINI in the last 168 hours. BNP (last 3 results) No results for input(s): PROBNP in the last 8760 hours. HbA1C: Recent Labs    03/22/20 1444  HGBA1C 5.8*   CBG: Recent Labs  Lab 03/22/20 1449  GLUCAP 94   Lipid Profile: No results for input(s): CHOL, HDL, LDLCALC, TRIG, CHOLHDL, LDLDIRECT in the last 72 hours. Thyroid Function Tests: Recent Labs    03/22/20 1444  TSH 0.744   Anemia Panel: No results for input(s): VITAMINB12, FOLATE, FERRITIN, TIBC, IRON, RETICCTPCT in the last 72 hours. Sepsis Labs: Recent Labs  Lab 03/22/20 1451 03/23/20 1247  LATICACIDVEN 2.6* 0.8    Recent Results (from the past 240 hour(s))  SARS Coronavirus 2 by RT PCR (hospital order, performed in [facility] hospital lab) Nasopharyngeal Nasopharyngeal Swab     Status: None   Collection Time: 03/22/20  4:33 PM   Specimen: Nasopharyngeal Swab  Result Value Ref Range Status   SARS Coronavirus 2 NEGATIVE NEGATIVE Final    Comment: (NOTE) SARS-CoV-2 target nucleic acids are NOT DETECTED.  The SARS-CoV-2 RNA is generally detectable in upper and lower respiratory specimens during the acute phase of infection. The lowest concentration of SARS-CoV-2 viral  copies this assay can detect  is 250 copies / mL. A negative result does not preclude SARS-CoV-2 infection and should not be used as the sole basis for treatment or other patient management decisions.  A negative result may occur with improper specimen collection / handling, submission of specimen other than nasopharyngeal swab, presence of viral mutation(s) within the areas targeted by this assay, and inadequate number of viral copies (<250 copies / mL). A negative result must be combined with clinical observations, patient history, and epidemiological information.  Fact Sheet for Patients:   StrictlyIdeas.no  Fact Sheet for Healthcare Providers: BankingDealers.co.za  This test is not yet approved or  cleared by the [place] FDA and has been authorized for detection and/or diagnosis of SARS-CoV-2 by FDA under an Emergency Use Authorization (EUA).  This EUA will remain in effect (meaning this test can be used) for the duration of the COVID-19 declaration under Section 564(b)(1) of the Act, 21 U.S.C. section 360bbb-3(b)(1), unless the authorization is terminated or revoked sooner.  Performed at [facility], [place] [address], [place], [place] [address]       Radiology Studies: CT HEAD WO CONTRAST  Result Date: 03/22/2020 CLINICAL DATA:  Syncope EXAM: CT HEAD WITHOUT CONTRAST TECHNIQUE: Contiguous axial images were obtained from the base of the skull through the vertex without intravenous contrast. COMPARISON:  None. FINDINGS: Brain: No evidence of acute infarction, hemorrhage, hydrocephalus, extra-axial collection or mass lesion/mass effect. Vascular: No hyperdense vessel or unexpected calcification. Skull: No osseous abnormality. Sinuses/Orbits: Visualized paranasal sinuses are clear. Visualized mastoid sinuses are clear. Visualized orbits demonstrate no focal abnormality. Other: Left parietal scalp laceration with hematoma. IMPRESSION: 1. No acute  intracranial pathology. 2. Left parietal scalp laceration with hematoma. Electronically Signed   By: Kathreen Devoid   On: 03/22/2020 15:48   CT Angio Chest PE W and/or Wo Contrast  Result Date: 03/22/2020 CLINICAL DATA:  59 year old female with syncope. Concern for pulmonary embolism. EXAM: CT ANGIOGRAPHY CHEST WITH CONTRAST TECHNIQUE: Multidetector CT imaging of the chest was performed using the standard protocol during bolus administration of intravenous contrast. Multiplanar CT image reconstructions and MIPs were obtained to evaluate the vascular anatomy. CONTRAST:  71mL OMNIPAQUE IOHEXOL 350 MG/ML SOLN COMPARISON:  Chest CT dated 08/26/2006. FINDINGS: Cardiovascular: There is no cardiomegaly. Small pericardial effusion. There is mild atherosclerotic calcification of the thoracic aorta. Evaluation of the pulmonary arteries is somewhat limited due to respiratory motion artifact. Small right lower lobe segmental or subsegmental nonocclusive pulmonary artery emboli noted, age indeterminate. No large or central pulmonary artery embolus. No CT evidence of right heart straining. Mediastinum/Nodes: There is no hilar or mediastinal adenopathy. The esophagus and the thyroid gland are grossly unremarkable. No mediastinal fluid collection. Lungs/Pleura: There are bibasilar atelectasis. No focal consolidation, pleural effusion, or pneumothorax. The central airways are patent. Upper Abdomen: Probable fatty liver. Musculoskeletal: No chest wall abnormality. No acute or significant osseous findings. Review of the MIP images confirms the above findings. IMPRESSION: 1. Small nodule seen right lower lobe segmental or subsegmental pulmonary artery emboli, age indeterminate. No large or central pulmonary artery embolus. 2. Small pericardial effusion. 3. Aortic Atherosclerosis (ICD10-I70.0). These results were called by telephone at the time of interpretation on 03/22/2020 at 4:15 pm to provider [facility] , who verbally  acknowledged these results. Electronically Signed   By: Anner Crete M.D.   On: 03/22/2020 16:16   CT CERVICAL SPINE WO CONTRAST  Result Date: 03/22/2020 CLINICAL DATA:  Neck trauma, uncomplicated.  Fall, trauma, neck pain. EXAM: CT CERVICAL SPINE WITHOUT CONTRAST TECHNIQUE: Multidetector CT imaging of the cervical spine was performed without intravenous contrast. Multiplanar CT image reconstructions were also generated. COMPARISON:  No pertinent prior exams are available for comparison. FINDINGS: Alignment: Cervical levocurvature reversal of the expected cervical lordosis. No significant spondylolisthesis. Skull base and vertebrae: The basion-dental and atlanto-dental intervals are maintained.No evidence of acute fracture to the cervical spine. There are multiple subcentimeter lucent osseous lesions within the cervical spine, for instance within the C3 and C5 vertebral bodies on image 33 of series 13, within the C2 left articular pillar on image 45 of series 13, and within the C7 right transverse process on series 13, image 22. Soft tissues and spinal canal: No prevertebral fluid or swelling. No visible canal hematoma. Disc levels: Cervical spondylosis. Most notably at C4-C5 there is moderate/advanced disc space narrowing with a bulky posterior disc osteophyte complex which contributes to at least moderate spinal canal stenosis. Upper chest: No consolidation within the imaged lung apices. No visible pneumothorax IMPRESSION: No evidence of acute fracture to the cervical spine. There are several subcentimeter lucent osseous lesions within the cervical spine. These foci are nonspecific and could certainly be a benign. However, osseous metastases cannot be excluded and clinical correlation for any known primary malignancy is recommended. Additionally, multiple myeloma could have a similar imaging appearance and correlation with relevant laboratory values is recommended. Cervical levocurvature and nonspecific  reversal of the expected cervical lordosis. At C4-C5, a bulky posterior disc osteophyte contributes to at least moderate spinal canal stenosis, likely with some degree of spinal cord mass effect. Electronically Signed   By: Kellie Simmering DO   On: 03/22/2020 15:44   DG Pelvis Portable  Result Date: 03/22/2020 CLINICAL DATA:  Syncope with fall. EXAM: PORTABLE PELVIS 1-2 VIEWS COMPARISON:  Sacral x-rays dated September 10, 2006. FINDINGS: There is no evidence of pelvic fracture or diastasis. No pelvic bone lesions are seen. Prior L3-L5 fusion. Kissing iliac stents noted. Surgical clips in the right groin. IMPRESSION: 1. Negative. Electronically Signed   By: Titus Dubin M.D.   On: 03/22/2020 14:56   DG Chest Port 1 View  Result Date: 03/22/2020 CLINICAL DATA:  Syncope with fall. EXAM: PORTABLE CHEST 1 VIEW COMPARISON:  Chest x-ray dated Dec 10, 2019. FINDINGS: The heart size and mediastinal contours are within normal limits. Mild scarring and atelectasis at the peripheral left lung base. No focal consolidation, pleural effusion, or pneumothorax. No acute osseous abnormality. IMPRESSION: 1. No active disease. Electronically Signed   By: Titus Dubin M.D.   On: 03/22/2020 14:54   EEG adult  Result Date: 03/23/2020 Lora Havens, MD     03/23/2020 10:19 AM Patient Name: ANNSLEY AKKERMAN MRN: [id number] Epilepsy Attending: Lora Havens Referring Physician/Provider: Dr Darliss Cheney Date: 03/22/2020  Duration: 28.10 mins Patient history: 59yo F with syncope and ams. EEG to evaluate for seizure. Level of alertness: Awake AEDs during EEG study: GBP, TPM Technical aspects: This EEG study was done with scalp electrodes positioned according to the 10-20 International system of electrode placement. Electrical activity was acquired at a sampling rate of $Remov'500Hz'UFxrus$  and reviewed with a high frequency filter of $RemoveB'70Hz'PfMyErvg$  and a low frequency filter of $RemoveB'1Hz'UIQAOdpM$ . EEG data were recorded continuously and digitally stored. Description:  No posterior dominant rhythm was seen. EEG showed continuous generalized 5-8 Hz theta-alpha activity as well as 2-$RemoveB'3hz'YQrmIUTS$  delta slowing was noted. Hyperventilation and photic stimulation were not performed.  Bilateral twitching of  upper extremities and eyelid twitching was intermittently noted during study. Concomitant eeg before, during and after eeg didn't show any eeg change to suggest seizure.  ABNORMALITY -Continuous slow, generalized IMPRESSION: This study is suggestive of mild to moderate diffuse encephalopathy, nonspecific etiology. No seizures or epileptiform discharges were seen throughout the recording. Lora Havens   ECHOCARDIOGRAM COMPLETE  Result Date: 03/23/2020    ECHOCARDIOGRAM REPORT   Patient Name:   TRENIYA LOBB Accardo Date of Exam: 03/22/2020 Medical Rec #:  [id number]        Height:       62.0 in Accession #:    [id number]       Weight:       175.0 lb Date of Birth:  1960-10-14        BSA:          1.806 m Patient Age:    52 years         BP:           141/77 mmHg Patient Gender: F                HR:           104 bpm. Exam Location:  Inpatient Procedure: 2D Echo Indications:    elevated troponin  History:        Patient has no prior history of Echocardiogram examinations.                 Pulmonary embolism; Signs/Symptoms:Syncope.  Sonographer:    Johny Chess Referring Phys: [id number] [place]  1. Left ventricular ejection fraction, by estimation, is 60 to 65%. The left ventricle has normal function. The left ventricle has no regional wall motion abnormalities. Left ventricular diastolic parameters are consistent with Grade I diastolic dysfunction (impaired relaxation).  2. Right ventricular systolic function is normal. The right ventricular size is normal.  3. The mitral valve is normal in structure. No evidence of mitral valve regurgitation. No evidence of mitral stenosis.  4. The aortic valve is tricuspid. Aortic valve regurgitation is not visualized. No aortic stenosis  is present.  5. The inferior vena cava is normal in size with greater than 50% respiratory variability, suggesting right atrial pressure of 3 mmHg. FINDINGS  Left Ventricle: Left ventricular ejection fraction, by estimation, is 60 to 65%. The left ventricle has normal function. The left ventricle has no regional wall motion abnormalities. The left ventricular internal cavity size was normal in size. There is  no left ventricular hypertrophy. Left ventricular diastolic parameters are consistent with Grade I diastolic dysfunction (impaired relaxation). Right Ventricle: The right ventricular size is normal.Right ventricular systolic function is normal. Left Atrium: Left atrial size was normal in size. Right Atrium: Right atrial size was normal in size. Pericardium: There is no evidence of pericardial effusion. Mitral Valve: The mitral valve is normal in structure. Normal mobility of the mitral valve leaflets. No evidence of mitral valve regurgitation. No evidence of mitral valve stenosis. Tricuspid Valve: The tricuspid valve is normal in structure. Tricuspid valve regurgitation is trivial. No evidence of tricuspid stenosis. Aortic Valve: The aortic valve is tricuspid. Aortic valve regurgitation is not visualized. No aortic stenosis is present. Pulmonic Valve: The pulmonic valve was normal in structure. Pulmonic valve regurgitation is not visualized. No evidence of pulmonic stenosis. Aorta: The aortic root is normal in size and structure. Venous: The inferior vena cava is normal in size with greater than 50% respiratory variability, suggesting right atrial pressure of 3 mmHg.  IAS/Shunts: No atrial level shunt detected by color flow Doppler.  LEFT VENTRICLE PLAX 2D LVIDd:         4.40 cm Diastology LVIDs:         2.70 cm LV e' medial:   10.10 cm/s LV PW:         1.00 cm LV E/e' medial: 8.2 LV IVS:        0.80 cm  RIGHT VENTRICLE RV S prime:     12.70 cm/s LEFT ATRIUM             Index       RIGHT ATRIUM           Index  LA diam:        3.00 cm 1.66 cm/m  RA Area:     10.20 cm LA Vol (A2C):   46.0 ml 25.47 ml/m RA Volume:   20.50 ml  11.35 ml/m LA Vol (A4C):   32.8 ml 18.16 ml/m LA Biplane Vol: 39.8 ml 22.04 ml/m  AORTIC VALVE LVOT Vmax:   124.00 cm/s LVOT Vmean:  79.600 cm/s LVOT VTI:    0.201 m  AORTA Ao Asc diam: 2.80 cm MV E velocity: 82.70 cm/s MV A velocity: 114.00 cm/s  SHUNTS MV E/A ratio:  0.73         Systemic VTI: 0.20 m Kirk Ruths MD Electronically signed by Kirk Ruths MD Signature Date/Time: 03/23/2020/3:06:17 PM    Final    VAS [place] LOWER EXTREMITY VENOUS (DVT)  Result Date: 03/23/2020  Lower Venous DVTStudy Indications: Pulmonary embolism.  Comparison Study: no prior Performing Technologist: Abram Sander RVS  Examination Guidelines: A complete evaluation includes B-mode imaging, spectral Doppler, color Doppler, and power Doppler as needed of all accessible portions of each vessel. Bilateral testing is considered an integral part of a complete examination. Limited examinations for reoccurring indications may be performed as noted. The reflux portion of the exam is performed with the patient in reverse Trendelenburg.  +---------+---------------+---------+-----------+----------+--------------+  RIGHT     Compressibility Phasicity Spontaneity Properties Thrombus Aging  +---------+---------------+---------+-----------+----------+--------------+  CFV       Full            Yes       Yes                                    +---------+---------------+---------+-----------+----------+--------------+  SFJ       Full                                                             +---------+---------------+---------+-----------+----------+--------------+  FV Prox   Full                                                             +---------+---------------+---------+-----------+----------+--------------+  FV Mid    Full                                                              +---------+---------------+---------+-----------+----------+--------------+  FV Distal Full                                                             +---------+---------------+---------+-----------+----------+--------------+  PFV       Full                                                             +---------+---------------+---------+-----------+----------+--------------+  POP       Full            Yes       Yes                                    +---------+---------------+---------+-----------+----------+--------------+  PTV       Full                                                             +---------+---------------+---------+-----------+----------+--------------+  PERO      Full                                                             +---------+---------------+---------+-----------+----------+--------------+   +---------+---------------+---------+-----------+----------+--------------+  LEFT      Compressibility Phasicity Spontaneity Properties Thrombus Aging  +---------+---------------+---------+-----------+----------+--------------+  CFV       Full            Yes       Yes                                    +---------+---------------+---------+-----------+----------+--------------+  SFJ       Full                                                             +---------+---------------+---------+-----------+----------+--------------+  FV Prox   Full                                                             +---------+---------------+---------+-----------+----------+--------------+  FV Mid    Full                                                             +---------+---------------+---------+-----------+----------+--------------+  FV Distal Full                                                             +---------+---------------+---------+-----------+----------+--------------+  PFV       Full                                                              +---------+---------------+---------+-----------+----------+--------------+  POP       Full            Yes       Yes                                    +---------+---------------+---------+-----------+----------+--------------+  PTV       Full                                                             +---------+---------------+---------+-----------+----------+--------------+  PERO      Full                                                             +---------+---------------+---------+-----------+----------+--------------+     Summary: BILATERAL: - No evidence of deep vein thrombosis seen in the lower extremities, bilaterally. - No evidence of superficial venous thrombosis in the lower extremities, bilaterally. -   *See table(s) above for measurements and observations. Electronically signed by Servando Snare MD on 03/23/2020 at 2:48:17 PM.    Final     Scheduled Meds:  amLODipine  5 mg Oral Daily   apixaban  10 mg Oral BID   Followed by   Derrill Memo ON 03/29/2020] apixaban  5 mg Oral BID   bacitracin   Topical BID   dicyclomine  20 mg Oral TID   escitalopram  20 mg Oral Daily   gabapentin  400 mg Oral QHS   metoprolol tartrate  25 mg Oral BID   pantoprazole  40 mg Oral Daily   rosuvastatin  40 mg Oral Daily   topiramate  100 mg Oral Daily   Continuous Infusions:  sodium chloride 100 mL/hr at 03/24/20 0131     LOS: 1 day   Time spent: 30 minutes   Darliss Cheney, MD [facility]  03/24/2020, 10:53 AM   To contact the attending provider between 7A-7P or the covering provider during after hours 7P-7A, please log into the web site [email or web address].

## 2020-03-24 NOTE — Progress Notes (Signed)
Pt still refusing bed alarm for the reason of frequent need to use the Bathroom. Educated to call if pt feels Dizzy and lightheaded. Will monitor.

## 2020-03-24 NOTE — Progress Notes (Signed)
ANTICOAGULATION CONSULT NOTE  Pharmacy Consult for heparin >> apixaban Indication: pulmonary embolus  No Known Allergies  Patient Measurements: Height: 5\' 2"  (157.5 cm) Weight: 85.8 kg (189 lb 3.2 oz) IBW/kg (Calculated) : 50.1 Heparin Dosing Weight: 67.7kg  Vital Signs: Temp: 99 F (37.2 C) (08/20 0744) Temp Source: Oral (08/20 0744) BP: 151/104 (08/20 0744) Pulse Rate: 80 (08/20 0744)  Labs: Recent Labs    03/22/20 1444 03/22/20 1444 03/22/20 1459 03/22/20 1459 03/22/20 2244 03/22/20 2244 03/23/20 0406 03/23/20 0421 03/23/20 1247 03/24/20 0514  HGB 15.0   < > 16.7*   < >  --   --  13.3  --   --  13.8  HCT 50.2*   < > 49.0*  --   --   --  43.8  --   --  44.8  PLT 127*  --   --   --   --   --  122*  --   --  121*  LABPROT 12.5  --   --   --   --   --   --   --   --   --   INR 1.0  --   --   --   --   --   --   --   --   --   HEPARINUNFRC  --   --   --   --  0.55   < >  --  0.81* 0.64 0.40  CREATININE 1.12*  --  1.10*  --   --   --  1.10*  --   --   --   TROPONINIHS 180*  --   --   --  380*  --   --   --  144*  --    < > = values in this interval not displayed.    Estimated Creatinine Clearance: 57.4 mL/min (A) (by C-G formula based on SCr of 1.1 mg/dL (H)).   Assessment: 50 yof presented s/p syncopal episode and fall. Found to have a small PE and started on IV heparin.  CT head negative for intracranial bleed but pt does have a scalp lac with hematoma.   Heparin level therapeutic. Switch to apixaban per MD. Has received 2 days of full anticoagulation. H/H, plt stable.   Goal of Therapy:  Monitor platelets by anticoagulation protocol: Yes   Plan:  Stop heparin gtt Start apixaban 10mg  BID for 5 more days then decrease to 5mg  BID  Monitor for signs/symptoms of bleeding    81, PharmD, BCPS, BCCP Clinical Pharmacist  Please check AMION for all [facility] Pharmacy phone numbers After 10:00 PM, call Main Pharmacy [phone]

## 2020-03-24 NOTE — Progress Notes (Signed)
Patient refused to turn the bed alarm due to frequent trips to the bathroom. Offered bedside commode but also refused.  Patient having diarrhea, MD page made aware, Awaiting to call back. Husband at bedside.

## 2020-03-24 NOTE — Evaluation (Signed)
Occupational Therapy Evaluation Patient Details Name: Selena Clark MRN: [id number] DOB: [birth year] Today's Date: 03/24/2020    History of Present Illness 59 y.o. female with medical history significant of CAD s/p open heart surgery on Plavix, IBS, anxiety, hypertension and hyperlipidemia was brought into the ED due to concern of passing out. Arrived via EMS and in ED was found to be hypoxic with oxygen saturation CT angiogram of the chest found to have 2 small subsegmental right lower lobe PEs. Admitted 03/22/20 for treatment of syncope, acute respiratory failure with hypoxia secondary to acute PE   Clinical Impression   Pt met in hallway having pre-psyncopal episode with PT, assisted back to room, and once there into chair. Pt min A for transfers for balance and safety. Pt min A for LB ADL, set up for UB ADL. Pt then brushed teeth in recliner (unable to maintain standing) and immediately vomited. Pt assisted min A back to bed. Pt will benefit from skilled OT in the acute setting for fall prevention, activity tolerance, and energy conservation strategies. Pt will benefit from [facility] for falls risk assessment and strategies to maximize safety and independence in ADL and functional transfers. Next session to focus on energy conservation and standing grooming (watch for syncopal episode)    Follow Up Recommendations  Home health OT;Supervision/Assistance - 24 hour    Equipment Recommendations  None recommended by OT (Pt has appropriate DME)    Recommendations for Other Services       Precautions / Restrictions Precautions Precautions: Fall Precaution Comments: syncopal  Restrictions Weight Bearing Restrictions: No      Mobility Bed Mobility Overal bed mobility: Needs Assistance Bed Mobility: Sit to Supine     Supine to sit: HOB elevated;Supervision Sit to supine: Supervision   General bed mobility comments: HOB slightly elevated, no use of bedrail to return  supine  Transfers Overall transfer level: Needs assistance Equipment used: 1 person hand held assist (grab bar in bathroom ) Transfers: Sit to/from [facility] Sit to Stand: Min guard Stand pivot transfers: Min assist;Min guard       General transfer comment: min guard assist for small balance during transfer back to chair    Balance Overall balance assessment: Needs assistance Sitting-balance support: Feet supported;No upper extremity supported Sitting balance-Leahy Scale: Fair     Standing balance support: Bilateral upper extremity supported;Single extremity supported;During functional activity Standing balance-Leahy Scale: Poor Standing balance comment: requiring at least single UE support                           ADL either performed or assessed with clinical judgement   ADL Overall ADL's : Needs assistance/impaired Eating/Feeding: Independent   Grooming: Oral care;Wash/dry face;Set up;Sitting Grooming Details (indicate cue type and reason): unable to maintain standing due to presyncopal episode and nausea Upper Body Bathing: Set up   Lower Body Bathing: Min guard   Upper Body Dressing : Set up   Lower Body Dressing: Minimal assistance   Toilet Transfer: Minimal assistance;Stand-pivot Toilet Transfer Details (indicate cue type and reason): hand held assist for balance Toileting- Clothing Manipulation and Hygiene: Supervision/safety;Sitting/lateral lean   Tub/ Shower Transfer: Minimal assistance   Functional mobility during ADLs: Min guard;Minimal assistance;Rolling walker (presyncolpal episode) General ADL Comments: limited activity tolerance, falling 2x/month at home previous, husband is helpful     Vision Baseline Vision/History: Wears glasses Wears Glasses: At all times Patient Visual Report: No change from baseline Vision Assessment?:  No apparent visual deficits     Perception     Praxis      Pertinent Vitals/Pain Pain  Assessment: 0-10 Pain Score: 7  Pain Location: headache Pain Descriptors / Indicators: Headache Pain Intervention(s): Limited activity within patient's tolerance;Monitored during session;Repositioned     Hand Dominance     Extremity/Trunk Assessment Upper Extremity Assessment Upper Extremity Assessment: Overall WFL for tasks assessed;Generalized weakness   Lower Extremity Assessment Lower Extremity Assessment: Defer to PT evaluation   Cervical / Trunk Assessment Cervical / Trunk Assessment: Kyphotic   Communication Communication Communication: No difficulties   Cognition Arousal/Alertness: Awake/alert Behavior During Therapy: WFL for tasks assessed/performed Overall Cognitive Status: Within Functional Limits for tasks assessed                                     General Comments  at rest HR 78bpm, SaO2 on RA 97%O2 with ambulation and presyncopal event pt HR 86, SaO2 on RA 93%O2, BP back in room 125/108    Exercises     Shoulder Instructions      Home Living Family/patient expects to be discharged to:: Private residence Living Arrangements: Spouse/significant other Available Help at Discharge: Family;Available 24 hours/day Type of Home: House Home Access: Stairs to enter [employer] of Steps: 1   Home Layout: One level     Bathroom Shower/Tub: Teacher, early years/pre: Standard Bathroom Accessibility: Yes   Home Equipment: Clinical cytogeneticist - 2 wheels;Cane - single point          Prior Functioning/Environment Level of Independence: Needs assistance  Gait / Transfers Assistance Needed: uses cane or RW depending on how she is feeling  ADL's / Homemaking Assistance Needed: able to do most ADLs, husband assists with iADLs   Comments: falls at least 2x/month at home, ambulated limited community distances with cane/RW        OT Problem List: Decreased activity tolerance;Impaired balance (sitting and/or standing);Decreased  safety awareness;Decreased knowledge of use of DME or AE;Pain      OT Treatment/Interventions: Self-care/ADL training;Energy conservation;DME and/or AE instruction;Therapeutic activities;Patient/family education;Balance training    OT Goals(Current goals can be found in the care plan section) Acute Rehab OT Goals Patient Stated Goal: go home OT Goal Formulation: With patient Time For Goal Achievement: 04/07/20 Potential to Achieve Goals: Good ADL Goals Pt Will Perform Grooming: standing;with modified independence Pt Will Perform Upper Body Dressing: with set-up Pt Will Perform Lower Body Dressing: with set-up Pt Will Transfer to Toilet: with supervision;ambulating Pt Will Perform Toileting - Clothing Manipulation and hygiene: with modified independence;sitting/lateral leans Additional ADL Goal #1: Pt will verbalize 3 energy conservation strategies for ADL with no cues  OT Frequency: Min 1X/week   Barriers to D/C:            Co-evaluation              AM-PAC OT "6 Clicks" Daily Activity     Outcome Measure Help from another person eating meals?: None Help from another person taking care of personal grooming?: A Little Help from another person toileting, which includes using toliet, bedpan, or urinal?: A Little Help from another person bathing (including washing, rinsing, drying)?: A Little Help from another person to put on and taking off regular upper body clothing?: A Little Help from another person to put on and taking off regular lower body clothing?: A Lot 6 Click Score: 18  End of Session Equipment Utilized During Treatment: Gait belt Nurse Communication: Mobility status;Other (comment);Precautions (vomited)  Activity Tolerance: Treatment limited secondary to medical complications (Comment) (presyncopal episode and vomiting) Patient left: in bed;with call bell/phone within reach  OT Visit Diagnosis: Unsteadiness on feet (R26.81);Muscle weakness (generalized)  (M62.81);History of falling (Z91.81);Dizziness and giddiness (R42);Pain Pain - part of body:  (headache)                Time: 4473-9584 OT Time Calculation (min): 28 min Charges:  OT General Charges $OT Visit: 1 Visit OT Evaluation $OT Eval Moderate Complexity: 1 Mod OT Treatments $Self Care/Home Management : 8-22 mins  Jesse Sans OTR/L Acute Rehabilitation Services Pager: [phone] Office: [place] 03/24/2020, 11:20 AM

## 2020-03-24 NOTE — TOC Benefit Eligibility Note (Signed)
Transition of Care [facility]) Benefit Eligibility Note    Patient Details  Name: Selena Clark MRN: [id number] Date of Birth: [birth year]   Medication/Dose: Eliquis 5mg  bid for 30 day supply  Covered?: Yes  Tier: 3 Drug  Prescription Coverage Preferred Pharmacy: [employer] mail order,[facility]  Spoke with Person/Company/Phone Number:: [phone] Pharmacy PH# [phone]  Co-Pay: $47.00  Prior Approval: No  Deductible:  (No Deductible)       [phone] Phone Number: 03/24/2020, 10:42 AM

## 2020-03-24 NOTE — Evaluation (Signed)
Physical Therapy Evaluation Patient Details Name: Selena Clark MRN: [id number] DOB: 11/24/1960 Today's Date: 03/24/2020   History of Present Illness  59 y.o. female with medical history significant of CAD s/p open heart surgery on Plavix, IBS, anxiety, hypertension and hyperlipidemia was brought into the ED due to concern of passing out. Arrived via EMS and in ED was found to be hypoxic with oxygen saturation CT angiogram of the chest found to have 2 small subsegmental right lower lobe PEs. Admitted 03/22/20 for treatment of syncope, acute respiratory failure with hypoxia secondary to acute PE  Clinical Impression  PTA pt living with husband in single story home with 1 step to enter. Pt reports she is independent in most ADLs and family assists with iADLs. Pt is limited in safe mobility by syncopal/presyncopal events (see Ambulation), in presence of decreased strength and balance. Pt is supervision for bed mobility, min guard for transfers and min A for ambulation of 50 feet with RW. PT recommending HHPT to improve strength and balance for safe mobility in her home environment. PT will continue to follow acutely.     Follow Up Recommendations Home health PT;Supervision/Assistance - 24 hour    Equipment Recommendations  None recommended by PT    Recommendations for Other Services       Precautions / Restrictions Precautions Precautions: Fall Precaution Comments: syncopal  Restrictions Weight Bearing Restrictions: No      Mobility  Bed Mobility Overal bed mobility: Needs Assistance Bed Mobility: Supine to Sit     Supine to sit: HOB elevated;Supervision     General bed mobility comments: heavy use of bedrail and increased time and effort to come to seated EoB  Transfers Overall transfer level: Needs assistance Equipment used: Rolling walker (2 wheeled) (grab bar in bathroom ) Transfers: Sit to/from Stand Sit to Stand: Min guard         General transfer comment: min  guard for safety, vc for hand placement, utilizes RW and grab bar from low toilet  Ambulation/Gait Ambulation/Gait assistance: Min guard;Min assist Gait Distance (Feet): 50 Feet Assistive device: Rolling walker (2 wheeled)   Gait velocity: slowed Gait velocity interpretation: <1.31 ft/sec, indicative of household ambulator General Gait Details: initially min guard, vc for keeping RW on ground with ambulation to bathroom, with ambulation in hallway pt carrying on conversation, at about 40 feet pt request to rest, became diaphoretic and lightheaded, sat in chair to rest, RN notified, requires minA to get from chair to w/c for return to room,       Balance Overall balance assessment: Needs assistance Sitting-balance support: Feet supported;No upper extremity supported Sitting balance-Leahy Scale: Fair     Standing balance support: Bilateral upper extremity supported;Single extremity supported;During functional activity Standing balance-Leahy Scale: Poor Standing balance comment: requiring at least single UE support                             Pertinent Vitals/Pain Pain Assessment: 0-10 Pain Score: 7  Pain Location: headache Pain Descriptors / Indicators: Headache Pain Intervention(s): Limited activity within patient's tolerance;Monitored during session;Repositioned    Home Living Family/patient expects to be discharged to:: Private residence Living Arrangements: Spouse/significant other Available Help at Discharge: Family;Available 24 hours/day Type of Home: House Home Access: Stairs to enter   [employer] of Steps: 1 Home Layout: One level Home Equipment: Emergency planning/management officer - 2 wheels;Cane - single point      Prior Function Level of Independence:  Needs assistance   Gait / Transfers Assistance Needed: uses cane or RW depending on how she is feeling   ADL's / Homemaking Assistance Needed: able to do most ADLs, husband assists with iADLs  Comments:  ambulated limited community distances with cane/RW        Extremity/Trunk Assessment   Upper Extremity Assessment Upper Extremity Assessment: Defer to OT evaluation    Lower Extremity Assessment Lower Extremity Assessment: Generalized weakness    Cervical / Trunk Assessment Cervical / Trunk Assessment: Kyphotic  Communication   Communication: No difficulties  Cognition Arousal/Alertness: Awake/alert Behavior During Therapy: WFL for tasks assessed/performed Overall Cognitive Status: Within Functional Limits for tasks assessed                                        General Comments General comments (skin integrity, edema, etc.): at rest HR 78bpm, SaO2 on RA 97%O2 with ambulation and presyncopal event pt HR 86, SaO2 on RA 93%O2, BP back in room 125/108        Assessment/Plan    PT Assessment Patient needs continued PT services  PT Problem List Decreased strength;Decreased activity tolerance;Decreased balance;Decreased mobility;Decreased coordination       PT Treatment Interventions DME instruction;Gait training;Functional mobility training;Therapeutic activities;Therapeutic exercise;Balance training;Cognitive remediation;Patient/family education    PT Goals (Current goals can be found in the Care Plan section)  Acute Rehab PT Goals Patient Stated Goal: go home PT Goal Formulation: With patient Time For Goal Achievement: 04/07/20 Potential to Achieve Goals: Good    Frequency Min 3X/week    AM-PAC PT "6 Clicks" Mobility  Outcome Measure Help needed turning from your back to your side while in a flat bed without using bedrails?: None Help needed moving from lying on your back to sitting on the side of a flat bed without using bedrails?: A Little Help needed moving to and from a bed to a chair (including a wheelchair)?: None Help needed standing up from a chair using your arms (e.g., wheelchair or bedside chair)?: None Help needed to walk in hospital  room?: A Little Help needed climbing 3-5 steps with a railing? : A Lot 6 Click Score: 20    End of Session Equipment Utilized During Treatment: Gait belt Activity Tolerance: Treatment limited secondary to medical complications (Comment) (presyncopal event) Patient left: in chair;with call bell/phone within reach;with chair alarm set;Other (comment) (OT in room for Evaluation ) Nurse Communication: Mobility status;Patient requests pain meds;Other (comment) (presyncopal event) PT Visit Diagnosis: Unsteadiness on feet (R26.81);Other abnormalities of gait and mobility (R26.89);Muscle weakness (generalized) (M62.81);Difficulty in walking, not elsewhere classified (R26.2);Dizziness and giddiness (R42);Pain Pain - part of body:  (headache)    Time: 2542-7062 PT Time Calculation (min) (ACUTE ONLY): 34 min   Charges:   PT Evaluation $PT Eval Moderate Complexity: 1 Mod PT Treatments $Gait Training: 8-22 mins        Adylene Dlugosz B. Beverely Risen PT, DPT Acute Rehabilitation Services Pager [phone] Office [phone]   Elon Alas [place] 03/24/2020, 9:28 AM

## 2020-03-24 NOTE — Progress Notes (Signed)
Progress Note  Patient Name: Selena Clark Date of Encounter: 03/24/2020  [facility] Cardiologist: Helene Kelp, MD   Subjective   Sleepy this morning.  Has a headache.  No chest pain no shortness of breath.  Inpatient Medications    Scheduled Meds: . bacitracin   Topical BID  . clopidogrel  75 mg Oral Daily  . dicyclomine  20 mg Oral TID  . escitalopram  20 mg Oral Daily  . gabapentin  400 mg Oral QHS  . metoprolol tartrate  25 mg Oral BID  . pantoprazole  40 mg Oral Daily  . rosuvastatin  40 mg Oral Daily  . topiramate  100 mg Oral Daily   Continuous Infusions: . sodium chloride 100 mL/hr at 03/24/20 0131  . heparin 850 Units/hr (03/23/20 1857)   PRN Meds: acetaminophen **OR** acetaminophen, ondansetron **OR** ondansetron (ZOFRAN) IV, traZODone   Vital Signs    Vitals:   03/23/20 1430 03/23/20 2104 03/24/20 0129 03/24/20 0529  BP: (!) 163/95 (!) 154/93 (!) 150/93 (!) 176/105  Pulse: 82 86 79 79  Resp: $Remo'18 19 19 19  'aGgME$ Temp: 98.1 F (36.7 C) 98.7 F (37.1 C) 99.8 F (37.7 C) 99.2 F (37.3 C)  TempSrc: Oral Oral Oral Oral  SpO2: 100% 97% 98% 98%  Weight:    85.8 kg  Height:        Intake/Output Summary (Last 24 hours) at 03/24/2020 0628 Last data filed at 03/24/2020 0600 Gross per 24 hour  Intake 1879.2 ml  Output 1202 ml  Net 677.2 ml   Last 3 Weights 03/24/2020 03/22/2020  Weight (lbs) 189 lb 3.2 oz 175 lb  Weight (kg) 85.821 kg 79.379 kg      Telemetry    No pauses, no adverse arrhythmias- Personally Reviewed  ECG    NSR QTc 508 - Personally Reviewed  Physical Exam   GEN: No acute distress.   Neck: No JVD Cardiac: RRR, no murmurs, rubs, or gallops.  Respiratory: Clear to auscultation bilaterally. GI: Soft, nontender, non-distended  MS: No edema; No deformity. Neuro:  Nonfocal  Psych: Normal affect   Labs    High Sensitivity Troponin:   Recent Labs  Lab 03/22/20 1444 03/22/20 2244 03/23/20 1247  TROPONINIHS 180* 380* 144*       Chemistry Recent Labs  Lab 03/22/20 1444 03/22/20 1459 03/23/20 0406  NA 142 144 141  K 4.4 4.1 3.6  CL 107 104 109  CO2 27  --  26  GLUCOSE 102* 96 86  BUN $Re'10 13 9  'QGp$ CREATININE 1.12* 1.10* 1.10*  CALCIUM 9.0  --  8.4*  PROT 5.5*  --   --   ALBUMIN 2.7*  --   --   AST 18  --   --   ALT 9  --   --   ALKPHOS 68  --   --   BILITOT 0.3  --   --   GFRNONAA 54*  --  56*  GFRAA >60  --  >60  ANIONGAP 8  --  6     Hematology Recent Labs  Lab 03/22/20 1444 03/22/20 1444 03/22/20 1459 03/23/20 0406 03/24/20 0514  WBC 5.6  --   --  5.2 4.3  RBC 4.91  --   --  4.32 4.60  HGB 15.0   < > 16.7* 13.3 13.8  HCT 50.2*   < > 49.0* 43.8 44.8  MCV 102.2*  --   --  101.4* 97.4  MCH 30.5  --   --  30.8 30.0  MCHC 29.9*  --   --  30.4 30.8  RDW 17.5*  --   --  17.2* 16.3*  PLT 127*  --   --  122* 121*   < > = values in this interval not displayed.    BNPNo results for input(s): BNP, PROBNP in the last 168 hours.   DDimer No results for input(s): DDIMER in the last 168 hours.   Radiology    CT HEAD WO CONTRAST  Result Date: 03/22/2020 CLINICAL DATA:  Syncope EXAM: CT HEAD WITHOUT CONTRAST TECHNIQUE: Contiguous axial images were obtained from the base of the skull through the vertex without intravenous contrast. COMPARISON:  None. FINDINGS: Brain: No evidence of acute infarction, hemorrhage, hydrocephalus, extra-axial collection or mass lesion/mass effect. Vascular: No hyperdense vessel or unexpected calcification. Skull: No osseous abnormality. Sinuses/Orbits: Visualized paranasal sinuses are clear. Visualized mastoid sinuses are clear. Visualized orbits demonstrate no focal abnormality. Other: Left parietal scalp laceration with hematoma. IMPRESSION: 1. No acute intracranial pathology. 2. Left parietal scalp laceration with hematoma. Electronically Signed   By: Kathreen Devoid   On: 03/22/2020 15:48   CT Angio Chest PE W and/or Wo Contrast  Result Date: 03/22/2020 CLINICAL DATA:   59 year old female with syncope. Concern for pulmonary embolism. EXAM: CT ANGIOGRAPHY CHEST WITH CONTRAST TECHNIQUE: Multidetector CT imaging of the chest was performed using the standard protocol during bolus administration of intravenous contrast. Multiplanar CT image reconstructions and MIPs were obtained to evaluate the vascular anatomy. CONTRAST:  65mL OMNIPAQUE IOHEXOL 350 MG/ML SOLN COMPARISON:  Chest CT dated 08/26/2006. FINDINGS: Cardiovascular: There is no cardiomegaly. Small pericardial effusion. There is mild atherosclerotic calcification of the thoracic aorta. Evaluation of the pulmonary arteries is somewhat limited due to respiratory motion artifact. Small right lower lobe segmental or subsegmental nonocclusive pulmonary artery emboli noted, age indeterminate. No large or central pulmonary artery embolus. No CT evidence of right heart straining. Mediastinum/Nodes: There is no hilar or mediastinal adenopathy. The esophagus and the thyroid gland are grossly unremarkable. No mediastinal fluid collection. Lungs/Pleura: There are bibasilar atelectasis. No focal consolidation, pleural effusion, or pneumothorax. The central airways are patent. Upper Abdomen: Probable fatty liver. Musculoskeletal: No chest wall abnormality. No acute or significant osseous findings. Review of the MIP images confirms the above findings. IMPRESSION: 1. Small nodule seen right lower lobe segmental or subsegmental pulmonary artery emboli, age indeterminate. No large or central pulmonary artery embolus. 2. Small pericardial effusion. 3. Aortic Atherosclerosis (ICD10-I70.0). These results were called by telephone at the time of interpretation on 03/22/2020 at 4:15 pm to provider [facility] , who verbally acknowledged these results. Electronically Signed   By: Anner Crete M.D.   On: 03/22/2020 16:16   CT CERVICAL SPINE WO CONTRAST  Result Date: 03/22/2020 CLINICAL DATA:  Neck trauma, uncomplicated. Fall, trauma, neck  pain. EXAM: CT CERVICAL SPINE WITHOUT CONTRAST TECHNIQUE: Multidetector CT imaging of the cervical spine was performed without intravenous contrast. Multiplanar CT image reconstructions were also generated. COMPARISON:  No pertinent prior exams are available for comparison. FINDINGS: Alignment: Cervical levocurvature reversal of the expected cervical lordosis. No significant spondylolisthesis. Skull base and vertebrae: The basion-dental and atlanto-dental intervals are maintained.No evidence of acute fracture to the cervical spine. There are multiple subcentimeter lucent osseous lesions within the cervical spine, for instance within the C3 and C5 vertebral bodies on image 33 of series 13, within the C2 left articular pillar on image 45 of series 13, and within the C7 right transverse process on  series 13, image 22. Soft tissues and spinal canal: No prevertebral fluid or swelling. No visible canal hematoma. Disc levels: Cervical spondylosis. Most notably at C4-C5 there is moderate/advanced disc space narrowing with a bulky posterior disc osteophyte complex which contributes to at least moderate spinal canal stenosis. Upper chest: No consolidation within the imaged lung apices. No visible pneumothorax IMPRESSION: No evidence of acute fracture to the cervical spine. There are several subcentimeter lucent osseous lesions within the cervical spine. These foci are nonspecific and could certainly be a benign. However, osseous metastases cannot be excluded and clinical correlation for any known primary malignancy is recommended. Additionally, multiple myeloma could have a similar imaging appearance and correlation with relevant laboratory values is recommended. Cervical levocurvature and nonspecific reversal of the expected cervical lordosis. At C4-C5, a bulky posterior disc osteophyte contributes to at least moderate spinal canal stenosis, likely with some degree of spinal cord mass effect. Electronically Signed   By: Kellie Simmering DO   On: 03/22/2020 15:44   DG Pelvis Portable  Result Date: 03/22/2020 CLINICAL DATA:  Syncope with fall. EXAM: PORTABLE PELVIS 1-2 VIEWS COMPARISON:  Sacral x-rays dated September 10, 2006. FINDINGS: There is no evidence of pelvic fracture or diastasis. No pelvic bone lesions are seen. Prior L3-L5 fusion. Kissing iliac stents noted. Surgical clips in the right groin. IMPRESSION: 1. Negative. Electronically Signed   By: Titus Dubin M.D.   On: 03/22/2020 14:56   DG Chest Port 1 View  Result Date: 03/22/2020 CLINICAL DATA:  Syncope with fall. EXAM: PORTABLE CHEST 1 VIEW COMPARISON:  Chest x-ray dated Dec 10, 2019. FINDINGS: The heart size and mediastinal contours are within normal limits. Mild scarring and atelectasis at the peripheral left lung base. No focal consolidation, pleural effusion, or pneumothorax. No acute osseous abnormality. IMPRESSION: 1. No active disease. Electronically Signed   By: Titus Dubin M.D.   On: 03/22/2020 14:54   EEG adult  Result Date: 03/23/2020 Lora Havens, MD     03/23/2020 10:19 AM Patient Name: Selena Clark MRN: [id number] Epilepsy Attending: Lora Havens Referring Physician/Provider: Dr Darliss Cheney Date: 03/22/2020  Duration: 28.10 mins Patient history: 59yo F with syncope and ams. EEG to evaluate for seizure. Level of alertness: Awake AEDs during EEG study: GBP, TPM Technical aspects: This EEG study was done with scalp electrodes positioned according to the 10-20 International system of electrode placement. Electrical activity was acquired at a sampling rate of $Remov'500Hz'UtuFsl$  and reviewed with a high frequency filter of $RemoveB'70Hz'zYOtndMH$  and a low frequency filter of $RemoveB'1Hz'iNKIZvUH$ . EEG data were recorded continuously and digitally stored. Description: No posterior dominant rhythm was seen. EEG showed continuous generalized 5-8 Hz theta-alpha activity as well as 2-$RemoveB'3hz'zDezmBEX$  delta slowing was noted. Hyperventilation and photic stimulation were not performed.  Bilateral twitching of  upper extremities and eyelid twitching was intermittently noted during study. Concomitant eeg before, during and after eeg didn't show any eeg change to suggest seizure.  ABNORMALITY -Continuous slow, generalized IMPRESSION: This study is suggestive of mild to moderate diffuse encephalopathy, nonspecific etiology. No seizures or epileptiform discharges were seen throughout the recording. Lora Havens   ECHOCARDIOGRAM COMPLETE  Result Date: 03/23/2020    ECHOCARDIOGRAM REPORT   Patient Name:   Selena Clark Mcconahy Date of Exam: 03/22/2020 Medical Rec #:  [id number]        Height:       62.0 in Accession #:    [id number]       Weight:  175.0 lb Date of Birth:  06/11/1961        BSA:          1.806 m Patient Age:    89 years         BP:           141/77 mmHg Patient Gender: F                HR:           104 bpm. Exam Location:  Inpatient Procedure: 2D Echo Indications:    elevated troponin  History:        Patient has no prior history of Echocardiogram examinations.                 Pulmonary embolism; Signs/Symptoms:Syncope.  Sonographer:    Johny Chess Referring Phys: [id number] RAVI [place]  1. Left ventricular ejection fraction, by estimation, is 60 to 65%. The left ventricle has normal function. The left ventricle has no regional wall motion abnormalities. Left ventricular diastolic parameters are consistent with Grade I diastolic dysfunction (impaired relaxation).  2. Right ventricular systolic function is normal. The right ventricular size is normal.  3. The mitral valve is normal in structure. No evidence of mitral valve regurgitation. No evidence of mitral stenosis.  4. The aortic valve is tricuspid. Aortic valve regurgitation is not visualized. No aortic stenosis is present.  5. The inferior vena cava is normal in size with greater than 50% respiratory variability, suggesting right atrial pressure of 3 mmHg. FINDINGS  Left Ventricle: Left ventricular ejection fraction, by estimation, is  60 to 65%. The left ventricle has normal function. The left ventricle has no regional wall motion abnormalities. The left ventricular internal cavity size was normal in size. There is  no left ventricular hypertrophy. Left ventricular diastolic parameters are consistent with Grade I diastolic dysfunction (impaired relaxation). Right Ventricle: The right ventricular size is normal.Right ventricular systolic function is normal. Left Atrium: Left atrial size was normal in size. Right Atrium: Right atrial size was normal in size. Pericardium: There is no evidence of pericardial effusion. Mitral Valve: The mitral valve is normal in structure. Normal mobility of the mitral valve leaflets. No evidence of mitral valve regurgitation. No evidence of mitral valve stenosis. Tricuspid Valve: The tricuspid valve is normal in structure. Tricuspid valve regurgitation is trivial. No evidence of tricuspid stenosis. Aortic Valve: The aortic valve is tricuspid. Aortic valve regurgitation is not visualized. No aortic stenosis is present. Pulmonic Valve: The pulmonic valve was normal in structure. Pulmonic valve regurgitation is not visualized. No evidence of pulmonic stenosis. Aorta: The aortic root is normal in size and structure. Venous: The inferior vena cava is normal in size with greater than 50% respiratory variability, suggesting right atrial pressure of 3 mmHg. IAS/Shunts: No atrial level shunt detected by color flow Doppler.  LEFT VENTRICLE PLAX 2D LVIDd:         4.40 cm Diastology LVIDs:         2.70 cm LV e' medial:   10.10 cm/s LV PW:         1.00 cm LV E/e' medial: 8.2 LV IVS:        0.80 cm  RIGHT VENTRICLE RV S prime:     12.70 cm/s LEFT ATRIUM             Index       RIGHT ATRIUM           Index LA diam:  3.00 cm 1.66 cm/m  RA Area:     10.20 cm LA Vol (A2C):   46.0 ml 25.47 ml/m RA Volume:   20.50 ml  11.35 ml/m LA Vol (A4C):   32.8 ml 18.16 ml/m LA Biplane Vol: 39.8 ml 22.04 ml/m  AORTIC VALVE LVOT Vmax:    124.00 cm/s LVOT Vmean:  79.600 cm/s LVOT VTI:    0.201 m  AORTA Ao Asc diam: 2.80 cm MV E velocity: 82.70 cm/s MV A velocity: 114.00 cm/s  SHUNTS MV E/A ratio:  0.73         Systemic VTI: 0.20 m Kirk Ruths MD Electronically signed by Kirk Ruths MD Signature Date/Time: 03/23/2020/3:06:17 PM    Final    VAS [place] LOWER EXTREMITY VENOUS (DVT)  Result Date: 03/23/2020  Lower Venous DVTStudy Indications: Pulmonary embolism.  Comparison Study: no prior Performing Technologist: Abram Sander RVS  Examination Guidelines: A complete evaluation includes B-mode imaging, spectral Doppler, color Doppler, and power Doppler as needed of all accessible portions of each vessel. Bilateral testing is considered an integral part of a complete examination. Limited examinations for reoccurring indications may be performed as noted. The reflux portion of the exam is performed with the patient in reverse Trendelenburg.  +---------+---------------+---------+-----------+----------+--------------+ RIGHT    CompressibilityPhasicitySpontaneityPropertiesThrombus Aging +---------+---------------+---------+-----------+----------+--------------+ CFV      Full           Yes      Yes                                 +---------+---------------+---------+-----------+----------+--------------+ SFJ      Full                                                        +---------+---------------+---------+-----------+----------+--------------+ FV Prox  Full                                                        +---------+---------------+---------+-----------+----------+--------------+ FV Mid   Full                                                        +---------+---------------+---------+-----------+----------+--------------+ FV DistalFull                                                        +---------+---------------+---------+-----------+----------+--------------+ PFV      Full                                                         +---------+---------------+---------+-----------+----------+--------------+ POP      Full  Yes      Yes                                 +---------+---------------+---------+-----------+----------+--------------+ PTV      Full                                                        +---------+---------------+---------+-----------+----------+--------------+ PERO     Full                                                        +---------+---------------+---------+-----------+----------+--------------+   +---------+---------------+---------+-----------+----------+--------------+ LEFT     CompressibilityPhasicitySpontaneityPropertiesThrombus Aging +---------+---------------+---------+-----------+----------+--------------+ CFV      Full           Yes      Yes                                 +---------+---------------+---------+-----------+----------+--------------+ SFJ      Full                                                        +---------+---------------+---------+-----------+----------+--------------+ FV Prox  Full                                                        +---------+---------------+---------+-----------+----------+--------------+ FV Mid   Full                                                        +---------+---------------+---------+-----------+----------+--------------+ FV DistalFull                                                        +---------+---------------+---------+-----------+----------+--------------+ PFV      Full                                                        +---------+---------------+---------+-----------+----------+--------------+ POP      Full           Yes      Yes                                 +---------+---------------+---------+-----------+----------+--------------+ PTV  Full                                                         +---------+---------------+---------+-----------+----------+--------------+ PERO     Full                                                        +---------+---------------+---------+-----------+----------+--------------+     Summary: BILATERAL: - No evidence of deep vein thrombosis seen in the lower extremities, bilaterally. - No evidence of superficial venous thrombosis in the lower extremities, bilaterally. -   *See table(s) above for measurements and observations. Electronically signed by Servando Snare MD on 03/23/2020 at 2:48:17 PM.    Final     Cardiac Studies   ECHO normal EF  Patient Profile     59 y.o. female with recurrent syncope, possibly orthostasis, labile BP, CAD post remote CABG  Assessment & Plan    Syncope --continue evaluation with outpatient monitor. Thus far, no adverse rhythms --normal ECHO --longstanding issue with dizzy/falls with standing, likely orthostatic.   Essential hypertension --Do not treat HTN too aggressively in this setting.   PE/elevated troponin --subsegmental - can contribute to vagal response and syncope as well --likely etiology for mildly elevated troponin  Tobacco --cessation  CAD --post CABG normal EF no angina. No further eval.  --When switching to DOAC would recommend stopping Plavix to reduce bleeding risks.   Will set up monitor as outpatient.   No further cardiac work-up while here.  We will go ahead and sign off. She may follow-up with her primary cardiologist as above.     For questions or updates, please contact [place] Please consult [email or web address] for contact info under        Signed, Candee Furbish, MD  03/24/2020, 6:28 AM

## 2020-03-24 NOTE — Discharge Instructions (Signed)
Information on my medicine - ELIQUIS (apixaban)  This medication education was reviewed with me or my healthcare representative as part of my discharge preparation.    Why was Eliquis prescribed for you? Eliquis was prescribed to treat blood clots that may have been found in the veins of your legs (deep vein thrombosis) or in your lungs (pulmonary embolism) and to reduce the risk of them occurring again.  What do You need to know about Eliquis ? The starting dose is 10 mg (two 5 mg tablets) taken TWICE daily for the FIRST SEVEN (7) DAYS, then on (enter date)  03/29/20  the dose is reduced to ONE 5 mg tablet taken TWICE daily.  Eliquis may be taken with or without food.   Try to take the dose about the same time in the morning and in the evening. If you have difficulty swallowing the tablet whole please discuss with your pharmacist how to take the medication safely.  Take Eliquis exactly as prescribed and DO NOT stop taking Eliquis without talking to the doctor who prescribed the medication.  Stopping may increase your risk of developing a new blood clot.  Refill your prescription before you run out.  After discharge, you should have regular check-up appointments with your healthcare provider that is prescribing your Eliquis.    What do you do if you miss a dose? If a dose of ELIQUIS is not taken at the scheduled time, take it as soon as possible on the same day and twice-daily administration should be resumed. The dose should not be doubled to make up for a missed dose.  Important Safety Information A possible side effect of Eliquis is bleeding. You should call your healthcare provider right away if you experience any of the following: ? Bleeding from an injury or your nose that does not stop. ? Unusual colored urine (red or dark brown) or unusual colored stools (red or black). ? Unusual bruising for unknown reasons. ? A serious fall or if you hit your head (even if there is no  bleeding).  Some medicines may interact with Eliquis and might increase your risk of bleeding or clotting while on Eliquis. To help avoid this, consult your healthcare provider or pharmacist prior to using any new prescription or non-prescription medications, including herbals, vitamins, non-steroidal anti-inflammatory drugs (NSAIDs) and supplements.  This website has more information on Eliquis (apixaban): http://www.eliquis.com/eliquis/home  ============================================  Pulmonary Embolism    A pulmonary embolism (PE) is a sudden blockage or decrease of blood flow in one or both lungs. Most blockages come from a blood clot that forms in the vein of a lower leg, thigh, or arm (deep vein thrombosis, DVT) and travels to the lungs. A clot is blood that has thickened into a gel or solid. PE is a dangerous and life-threatening condition that needs to be treated right away.  What are the causes? This condition is usually caused by a blood clot that forms in a vein and moves to the lungs. In rare cases, it may be caused by air, fat, part of a tumor, or other tissue that moves through the veins and into the lungs.  What increases the risk? The following factors may make you more likely to develop this condition: 1. Experiencing a traumatic injury, such as breaking a hip or leg. 2. Having: ? A spinal cord injury. ? Orthopedic surgery, especially hip or knee replacement. ? Any major surgery. ? A stroke. ? DVT. ? Blood clots or blood clotting disease. ?  Long-term (chronic) lung or heart disease. ? Cancer treated with chemotherapy. ? A central venous catheter. 3. Taking medicines that contain estrogen. These include birth control pills and hormone replacement therapy. 4. Being: ? Pregnant. ? In the period of time after your baby is delivered (postpartum). ? Older than age 64. ? Overweight. ? A smoker, especially if you have other risks.  What are the signs or  symptoms? Symptoms of this condition usually start suddenly and include:  Shortness of breath during activity or at rest.  Coughing, coughing up blood, or coughing up blood-tinged mucus.  Chest pain that is often worse with deep breaths.  Rapid or irregular heartbeat.  Feeling light-headed or dizzy.  Fainting.  Feeling anxious.  Fever.  Sweating.  Pain and swelling in a leg. This is a symptom of DVT, which can lead to PE. How is this diagnosed? This condition may be diagnosed based on:  Your medical history.  A physical exam.  Blood tests.  CT pulmonary angiogram. This test checks blood flow in and around your lungs.  Ventilation-perfusion scan, also called a lung VQ scan. This test measures air flow and blood flow to the lungs.  An ultrasound of the legs.  How is this treated? Treatment for this condition depends on many factors, such as the cause of your PE, your risk for bleeding or developing more clots, and other medical conditions you have. Treatment aims to remove, dissolve, or stop blood clots from forming or growing larger. Treatment may include: 1. Medicines, such as: ? Blood thinning medicines (anticoagulants) to stop clots from forming. ? Medicines that dissolve clots (thrombolytics). 2. Procedures, such as: ? Using a flexible tube to remove a blood clot (embolectomy) or to deliver medicine to destroy it (catheter-directed thrombolysis). ? Inserting a filter into a large vein that carries blood to the heart (inferior vena cava). This filter (vena cava filter) catches blood clots before they reach the lungs. ? Surgery to remove the clot (surgical embolectomy). This is rare. You may need a combination of immediate, long-term (up to 3 months after diagnosis), and extended (more than 3 months after diagnosis) treatments. Your treatment may continue for several months (maintenance therapy). You and your health care provider will work together to choose the  treatment program that is best for you.  Follow these instructions at home: Medicines 1. Take over-the-counter and prescription medicines only as told by your health care provider. 2. If you are taking an anticoagulant medicine: ? Take the medicine every day at the same time each day. ? Understand what foods and drugs interact with your medicine. ? Understand the side effects of this medicine, including excessive bruising or bleeding. Ask your health care provider or pharmacist about other side effects.  General instructions  Wear a medical alert bracelet or carry a medical alert card that says you have had a PE and lists what medicines you take.  Ask your health care provider when you may return to your normal activities. Avoid sitting or lying for a long time without moving.  Maintain a healthy weight. Ask your health care provider what weight is healthy for you.  Do not use any products that contain nicotine or tobacco, such as cigarettes, e-cigarettes, and chewing tobacco. If you need help quitting, ask your health care provider.  Talk with your health care provider about any travel plans. It is important to make sure that you are still able to take your medicine while on trips.  Keep all follow-up  visits as told by your health care provider. This is important.  Contact a health care provider if:  You missed a dose of your blood thinner medicine.  Get help right away if: 1. You have: ? New or increased pain, swelling, warmth, or redness in an arm or leg. ? Numbness or tingling in an arm or leg. ? Shortness of breath during activity or at rest. ? A fever. ? Chest pain. ? A rapid or irregular heartbeat. ? A severe headache. ? Vision changes. ? A serious fall or accident, or you hit your head. ? Stomach (abdominal) pain. ? Blood in your vomit, stool, or urine. ? A cut that will not stop bleeding. 2. You cough up blood. 3. You feel light-headed or dizzy. 4. You cannot move  your arms or legs. 5. You are confused or have memory loss.  These symptoms may represent a serious problem that is an emergency. Do not wait to see if the symptoms will go away. Get medical help right away. Call your local emergency services (911 in the [place] Do not drive yourself to the hospital. Summary  A pulmonary embolism (PE) is a sudden blockage or decrease of blood flow in one or both lungs. PE is a dangerous and life-threatening condition that needs to be treated right away.  Treatments for this condition usually include medicines to thin your blood (anticoagulants) or medicines to break apart blood clots (thrombolytics).  If you are given blood thinners, it is important to take the medicine every day at the same time each day.  Understand what foods and drugs interact with any medicines that you are taking.  If you have signs of PE or DVT, call your local emergency services (911 in the [place] This information is not intended to replace advice given to you by your health care provider. Make sure you discuss any questions you have with your health care provider. Document Revised: 04/29/2018 Document Reviewed: 04/29/2018 Elsevier Patient Education  2020 [employer].

## 2020-03-25 LAB — CBC
HCT: 49.6 % — ABNORMAL HIGH (ref 36.0–46.0)
Hemoglobin: 16.1 g/dL — ABNORMAL HIGH (ref 12.0–15.0)
MCH: 31.1 pg (ref 26.0–34.0)
MCHC: 32.5 g/dL (ref 30.0–36.0)
MCV: 95.9 fL (ref 80.0–100.0)
Platelets: 160 10*3/uL (ref 150–400)
RBC: 5.17 MIL/uL — ABNORMAL HIGH (ref 3.87–5.11)
RDW: 15.9 % — ABNORMAL HIGH (ref 11.5–15.5)
WBC: 6.6 10*3/uL (ref 4.0–10.5)
nRBC: 0 % (ref 0.0–0.2)

## 2020-03-25 LAB — BASIC METABOLIC PANEL
Anion gap: 11 (ref 5–15)
BUN: 7 mg/dL (ref 6–20)
CO2: 21 mmol/L — ABNORMAL LOW (ref 22–32)
Calcium: 9.2 mg/dL (ref 8.9–10.3)
Chloride: 110 mmol/L (ref 98–111)
Creatinine, Ser: 0.9 mg/dL (ref 0.44–1.00)
GFR calc Af Amer: >60 mL/min (ref >60)
GFR calc non Af Amer: >60 mL/min (ref >60)
Glucose, Bld: 94 mg/dL (ref 70–99)
Potassium: 3 mmol/L — ABNORMAL LOW (ref 3.5–5.1)
Sodium: 142 mmol/L (ref 135–145)

## 2020-03-25 LAB — C DIFFICILE QUICK SCREEN W PCR REFLEX
C Diff antigen: NEGATIVE
C Diff interpretation: NOT DETECTED
C Diff toxin: NEGATIVE

## 2020-03-25 MED ORDER — APIXABAN 5 MG PO TABS: 5.0000 mg | ORAL_TABLET | Freq: Two times a day (BID) | ORAL | 0 refills | Status: AC

## 2020-03-25 MED ORDER — APIXABAN 5 MG PO TABS: 10.0000 mg | ORAL_TABLET | Freq: Two times a day (BID) | ORAL | 0 refills | Status: DC

## 2020-03-25 NOTE — TOC Transition Note (Signed)
Transition of Care [facility]) - CM/SW Discharge Note   Patient Details  Name: SAMARIA ANES MRN: [id number] Date of Birth: 1961-04-12  [facility] Care [facility]) CM/SW Contact:  Bess Kinds, RN Phone Number: [phone] 03/25/2020, 11:51 AM   Clinical Narrative:     Spoke with patient on the hospital room phone. Discussed her copay for Eliquis. Will provide 30 day free trial card. Patient expressed appreciation. Advised that prescription has already been sent to pharmacy.   Discussed HH PT and OT recommendations. Patient agreeable. Offered choice of agency. Referral accepted by [facility]. HH orders entered by MD. Patient has cane and walker at home. Declines need for 3N1.   Spoke with patient and spouse at bedside. Eliquis care provided. Advised that trial card can only be used one time. Patient informed of accepting home health agency.   No further TOC needs identified.   Final next level of care: Home w Home Health Services Barriers to Discharge: No Barriers Identified   Patient Goals and CMS Choice Patient states their goals for this hospitalization and ongoing recovery are:: return home with spouse CMS Medicare.gov Compare Post Acute Care list provided to:: Patient Choice offered to / list presented to : Patient  Discharge Placement                       Discharge Plan and Services In-house Referral: NA Discharge Planning Services: CM Consult Post Acute Care Choice: Home Health          DME Arranged: N/A DME Agency: NA       HH Arranged: PT, OT HH Agency: [facility] Date [facility] Agency Contacted: 03/25/20 Time HH Agency Contacted: [phone] Representative spoke with at [facility] Agency: Kandee Keen  Social Determinants of Health (SDOH) Interventions     Readmission Risk Interventions No flowsheet data found.

## 2020-03-25 NOTE — Discharge Summary (Signed)
Physician Discharge Summary  Selena Clark VGQ:[id number] DOB: [birth year] DOA: 03/22/2020  PCP: Regino Bellow, MD  Admit date: 03/22/2020 Discharge date: 03/25/2020  Admitted From: Home Disposition: Home  Recommendations for Outpatient Follow-up:  1. Follow up with PCP in 1-2 weeks 2. Please obtain BMP/CBC in one week 3. Please follow up with your PCP on the following pending results: Unresulted Labs (From admission, onward)         None       Home Health: Yes Equipment/Devices: None  Discharge Condition: Stable CODE STATUS: Full code Diet recommendation: Cardiac  Subjective: Seen and examined.  Although she has had several bowel movement since yesterday but she tells me that this is her IBS flareup.  We have ruled her out of the C. difficile.  Despite of having several bowel movements, she does not feel any dizziness and is still wants to go home and feels comfortable with that decision.  She is walking around in the room by herself without having any symptoms.  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   Reportedly, patient was in the bathroom having a bowel movement and right she was washing her hands at the sink, she suddenly passed out. He was in the room and heard the noise so he attended her. He found her on the floor. She was unconscious however soon within a minute, she regained her consciousness but was confused. EMS was called. She was brought into the ED. According to patient, she did not have any prodromal symptoms prior to this episode. According to the husband, she keeps falling and falls approximately twice a month but has never sought any medical attention before. He thinks that she is overmedicated.  Upon arrival to ED, she was hypoxic with oxygen saturation in 50s.  Required nonrebreather and soon was transitioned to nasal cannula. CT angiogram of the chest was done which ruled out large PE however was found to have 2 small subsegmental right lower lobe PEs. Extensive imaging studies including chest x-ray, pelvic x-ray, CT cervical spine and CT head were all unremarkable.Slightly elevated lactic acid of 2.6. Troponin elevated at 180. Also has thrombocytopenia with platelets of 127.  TRH was called to admit.  Upon my evaluation, patient was still confused.  Troponins 180> 380> 144.  Cardiology was consulted.  Transthoracic echo showed normal ejection fraction with no wall motion abnormality and grade 1 diastolic dysfunction.  Cardiology opined that her elevated troponins were likely secondary to PE.  She was started on heparin drip from ED for her PE.  Her syncope was thought to be multifactorial, likely vasovagal since she just had a bowel movement, recurrent orthostatic and polypharmacy.  Cardiology recommended outpatient event monitor.  Slowly, she became more alert and now back to baseline.  EEG was negative for any seizure activity.  Doppler lower extremity negative for any DVT.  She was weaned down to room air yesterday.  We are going to discharge her yesterday but she was orthostatic positive with diaphoresis and lightheadedness when she worked with PT so they did not recommend discharging.  We kept her overnight.  She has then started having watery bowel movements, several of those overnight.  She calls it " IBS flareup" and tells [place] that she has this issue now and then and this is usually from eating some sort of food.  We checked her C. difficile and it is negative.  Despite of having bowel movements, she has no symptoms of dizziness and she is requesting to send her home.  Due to her PE, she was switched from heparin to Eliquis which is what she is going to be discharged on and cardiology recommended discontinuing Plavix while she is on heparin.  They will  arrange outpatient event monitor.  Discharge Diagnoses:  Active Problems:   Acute respiratory failure with hypoxia (HCC)   Acute pulmonary embolism (HCC)   Elevated troponin   Laceration of head   Syncope and collapse   AKI (acute kidney injury) [facility])    Discharge Instructions   Allergies as of 03/25/2020   No Known Allergies     Medication List    STOP taking these medications   clopidogrel 75 MG tablet Commonly known as: PLAVIX     TAKE these medications   apixaban 5 MG Tabs tablet Commonly known as: ELIQUIS Take 2 tablets (10 mg total) by mouth 2 (two) times daily for 5 days.   apixaban 5 MG Tabs tablet Commonly known as: ELIQUIS Take 1 tablet (5 mg total) by mouth 2 (two) times daily. Start taking on: March 29, 2020   dicyclomine 20 MG tablet Commonly known as: BENTYL Take 20 mg by mouth 3 (three) times daily.   escitalopram 20 MG tablet Commonly known as: LEXAPRO Take 20 mg by mouth daily.   gabapentin 400 MG capsule Commonly known as: NEURONTIN Take 400 mg by mouth in the morning, at noon, and at bedtime.   metoprolol tartrate 25 MG tablet Commonly known as: LOPRESSOR Take 25 mg by mouth 2 (two) times daily.   pantoprazole 40 MG tablet Commonly known as: PROTONIX Take 40 mg by mouth daily.   rosuvastatin 40 MG tablet Commonly known as: CRESTOR Take 40 mg by mouth daily.   tiZANidine 4 MG tablet Commonly known as: ZANAFLEX Take 4 mg by mouth 3 (three) times daily.   topiramate 100 MG tablet Commonly known as: TOPAMAX Take 100 mg by mouth daily.   traZODone 100 MG tablet Commonly known as: DESYREL Take 200 mg by mouth at bedtime as needed for sleep.   Xtampza ER 13.5 MG C12a Generic drug: oxyCODONE ER Take 1 capsule by mouth 2 (two) times daily.   Xtampza ER 18 MG C12a Generic drug: oxyCODONE ER Take 1 capsule by mouth 2 (two) times daily.       Follow-up Information    [facility] Office Follow up.   Specialty:  Cardiology Why: Dr. Anne Fu office will call to arrange 30 day event monitor.   we will send results to your cardiologist.  Contact information: [address], [address] [place] [address] [phone]       Laveda Abbe, MD. Schedule an appointment as soon as possible for a visit.   Specialty: Cardiology Why: to be seen in 3-4 weeks Contact information: [address] [place] [address] [phone]        Regino Bellow, MD Follow up in 1 week(s).   Specialty: Family Medicine Contact information: [address] [place] [address] [phone]        Laveda Abbe, MD .   Specialty: Cardiology Contact information: [address] [place] [address] [phone]              No Known Allergies  Consultations: Cardiology   Procedures/Studies: CT HEAD WO CONTRAST  Result  Date: 03/22/2020 CLINICAL DATA:  Syncope EXAM: CT HEAD WITHOUT CONTRAST TECHNIQUE: Contiguous axial images were obtained from the base of the skull through the vertex without intravenous contrast. COMPARISON:  None. FINDINGS: Brain: No evidence of acute infarction, hemorrhage, hydrocephalus, extra-axial collection or mass lesion/mass effect. Vascular: No hyperdense vessel or unexpected calcification. Skull: No osseous abnormality. Sinuses/Orbits: Visualized paranasal sinuses are clear. Visualized mastoid sinuses are clear. Visualized orbits demonstrate no focal abnormality. Other: Left parietal scalp laceration with hematoma. IMPRESSION: 1. No acute intracranial pathology. 2. Left parietal scalp laceration with hematoma. Electronically Signed   By: Kathreen Devoid   On: 03/22/2020 15:48   CT Angio Chest PE W and/or Wo Contrast  Result Date: 03/22/2020 CLINICAL DATA:  59 year old female with syncope. Concern for pulmonary embolism. EXAM: CT ANGIOGRAPHY CHEST WITH CONTRAST TECHNIQUE: Multidetector CT imaging of the chest was performed using the  standard protocol during bolus administration of intravenous contrast. Multiplanar CT image reconstructions and MIPs were obtained to evaluate the vascular anatomy. CONTRAST:  66mL OMNIPAQUE IOHEXOL 350 MG/ML SOLN COMPARISON:  Chest CT dated 08/26/2006. FINDINGS: Cardiovascular: There is no cardiomegaly. Small pericardial effusion. There is mild atherosclerotic calcification of the thoracic aorta. Evaluation of the pulmonary arteries is somewhat limited due to respiratory motion artifact. Small right lower lobe segmental or subsegmental nonocclusive pulmonary artery emboli noted, age indeterminate. No large or central pulmonary artery embolus. No CT evidence of right heart straining. Mediastinum/Nodes: There is no hilar or mediastinal adenopathy. The esophagus and the thyroid gland are grossly unremarkable. No mediastinal fluid collection. Lungs/Pleura: There are bibasilar atelectasis. No focal consolidation, pleural effusion, or pneumothorax. The central airways are patent. Upper Abdomen: Probable fatty liver. Musculoskeletal: No chest wall abnormality. No acute or significant osseous findings. Review of the MIP images confirms the above findings. IMPRESSION: 1. Small nodule seen right lower lobe segmental or subsegmental pulmonary artery emboli, age indeterminate. No large or central pulmonary artery embolus. 2. Small pericardial effusion. 3. Aortic Atherosclerosis (ICD10-I70.0). These results were called by telephone at the time of interpretation on 03/22/2020 at 4:15 pm to provider [facility] , who verbally acknowledged these results. Electronically Signed   By: Anner Crete M.D.   On: 03/22/2020 16:16   CT CERVICAL SPINE WO CONTRAST  Result Date: 03/22/2020 CLINICAL DATA:  Neck trauma, uncomplicated. Fall, trauma, neck pain. EXAM: CT CERVICAL SPINE WITHOUT CONTRAST TECHNIQUE: Multidetector CT imaging of the cervical spine was performed without intravenous contrast. Multiplanar CT image  reconstructions were also generated. COMPARISON:  No pertinent prior exams are available for comparison. FINDINGS: Alignment: Cervical levocurvature reversal of the expected cervical lordosis. No significant spondylolisthesis. Skull base and vertebrae: The basion-dental and atlanto-dental intervals are maintained.No evidence of acute fracture to the cervical spine. There are multiple subcentimeter lucent osseous lesions within the cervical spine, for instance within the C3 and C5 vertebral bodies on image 33 of series 13, within the C2 left articular pillar on image 45 of series 13, and within the C7 right transverse process on series 13, image 22. Soft tissues and spinal canal: No prevertebral fluid or swelling. No visible canal hematoma. Disc levels: Cervical spondylosis. Most notably at C4-C5 there is moderate/advanced disc space narrowing with a bulky posterior disc osteophyte complex which contributes to at least moderate spinal canal stenosis. Upper chest: No consolidation within the imaged lung apices. No visible pneumothorax IMPRESSION: No evidence of acute fracture to the cervical spine. There are several subcentimeter lucent osseous lesions within the cervical spine. These foci are nonspecific  and could certainly be a benign. However, osseous metastases cannot be excluded and clinical correlation for any known primary malignancy is recommended. Additionally, multiple myeloma could have a similar imaging appearance and correlation with relevant laboratory values is recommended. Cervical levocurvature and nonspecific reversal of the expected cervical lordosis. At C4-C5, a bulky posterior disc osteophyte contributes to at least moderate spinal canal stenosis, likely with some degree of spinal cord mass effect. Electronically Signed   By: Kellie Simmering DO   On: 03/22/2020 15:44   DG Pelvis Portable  Result Date: 03/22/2020 CLINICAL DATA:  Syncope with fall. EXAM: PORTABLE PELVIS 1-2 VIEWS COMPARISON:   Sacral x-rays dated September 10, 2006. FINDINGS: There is no evidence of pelvic fracture or diastasis. No pelvic bone lesions are seen. Prior L3-L5 fusion. Kissing iliac stents noted. Surgical clips in the right groin. IMPRESSION: 1. Negative. Electronically Signed   By: Titus Dubin M.D.   On: 03/22/2020 14:56   DG Chest Port 1 View  Result Date: 03/22/2020 CLINICAL DATA:  Syncope with fall. EXAM: PORTABLE CHEST 1 VIEW COMPARISON:  Chest x-ray dated Dec 10, 2019. FINDINGS: The heart size and mediastinal contours are within normal limits. Mild scarring and atelectasis at the peripheral left lung base. No focal consolidation, pleural effusion, or pneumothorax. No acute osseous abnormality. IMPRESSION: 1. No active disease. Electronically Signed   By: Titus Dubin M.D.   On: 03/22/2020 14:54   EEG adult  Result Date: 03/23/2020 Lora Havens, MD     03/23/2020 10:19 AM Patient Name: SHATERICA MCCLATCHY MRN: [id number] Epilepsy Attending: Lora Havens Referring Physician/Provider: Dr Darliss Cheney Date: 03/22/2020  Duration: 28.10 mins Patient history: 59yo F with syncope and ams. EEG to evaluate for seizure. Level of alertness: Awake AEDs during EEG study: GBP, TPM Technical aspects: This EEG study was done with scalp electrodes positioned according to the 10-20 International system of electrode placement. Electrical activity was acquired at a sampling rate of 500Hz and reviewed with a high frequency filter of 70Hz and a low frequency filter of 1Hz. EEG data were recorded continuously and digitally stored. Description: No posterior dominant rhythm was seen. EEG showed continuous generalized 5-8 Hz theta-alpha activity as well as 2-3hz delta slowing was noted. Hyperventilation and photic stimulation were not performed.  Bilateral twitching of upper extremities and eyelid twitching was intermittently noted during study. Concomitant eeg before, during and after eeg didn't show any eeg change to suggest  seizure.  ABNORMALITY -Continuous slow, generalized IMPRESSION: This study is suggestive of mild to moderate diffuse encephalopathy, nonspecific etiology. No seizures or epileptiform discharges were seen throughout the recording. Lora Havens   ECHOCARDIOGRAM COMPLETE  Result Date: 03/23/2020    ECHOCARDIOGRAM REPORT   Patient Name:   Selena Clark Ziller Date of Exam: 03/22/2020 Medical Rec #:  [id number]        Height:       62.0 in Accession #:    [id number]       Weight:       175.0 lb Date of Birth:  11-Jun-1961        BSA:          1.806 m Patient Age:    25 years         BP:           141/77 mmHg Patient Gender: F                HR:  104 bpm. Exam Location:  Inpatient Procedure: 2D Echo Indications:    elevated troponin  History:        Patient has no prior history of Echocardiogram examinations.                 Pulmonary embolism; Signs/Symptoms:Syncope.  Sonographer:    Johny Chess Referring Phys: [id number] Dnyla Antonetti [place]  1. Left ventricular ejection fraction, by estimation, is 60 to 65%. The left ventricle has normal function. The left ventricle has no regional wall motion abnormalities. Left ventricular diastolic parameters are consistent with Grade I diastolic dysfunction (impaired relaxation).  2. Right ventricular systolic function is normal. The right ventricular size is normal.  3. The mitral valve is normal in structure. No evidence of mitral valve regurgitation. No evidence of mitral stenosis.  4. The aortic valve is tricuspid. Aortic valve regurgitation is not visualized. No aortic stenosis is present.  5. The inferior vena cava is normal in size with greater than 50% respiratory variability, suggesting right atrial pressure of 3 mmHg. FINDINGS  Left Ventricle: Left ventricular ejection fraction, by estimation, is 60 to 65%. The left ventricle has normal function. The left ventricle has no regional wall motion abnormalities. The left ventricular internal cavity size was  normal in size. There is  no left ventricular hypertrophy. Left ventricular diastolic parameters are consistent with Grade I diastolic dysfunction (impaired relaxation). Right Ventricle: The right ventricular size is normal.Right ventricular systolic function is normal. Left Atrium: Left atrial size was normal in size. Right Atrium: Right atrial size was normal in size. Pericardium: There is no evidence of pericardial effusion. Mitral Valve: The mitral valve is normal in structure. Normal mobility of the mitral valve leaflets. No evidence of mitral valve regurgitation. No evidence of mitral valve stenosis. Tricuspid Valve: The tricuspid valve is normal in structure. Tricuspid valve regurgitation is trivial. No evidence of tricuspid stenosis. Aortic Valve: The aortic valve is tricuspid. Aortic valve regurgitation is not visualized. No aortic stenosis is present. Pulmonic Valve: The pulmonic valve was normal in structure. Pulmonic valve regurgitation is not visualized. No evidence of pulmonic stenosis. Aorta: The aortic root is normal in size and structure. Venous: The inferior vena cava is normal in size with greater than 50% respiratory variability, suggesting right atrial pressure of 3 mmHg. IAS/Shunts: No atrial level shunt detected by color flow Doppler.  LEFT VENTRICLE PLAX 2D LVIDd:         4.40 cm Diastology LVIDs:         2.70 cm LV e' medial:   10.10 cm/s LV PW:         1.00 cm LV E/e' medial: 8.2 LV IVS:        0.80 cm  RIGHT VENTRICLE RV S prime:     12.70 cm/s LEFT ATRIUM             Index       RIGHT ATRIUM           Index LA diam:        3.00 cm 1.66 cm/m  RA Area:     10.20 cm LA Vol (A2C):   46.0 ml 25.47 ml/m RA Volume:   20.50 ml  11.35 ml/m LA Vol (A4C):   32.8 ml 18.16 ml/m LA Biplane Vol: 39.8 ml 22.04 ml/m  AORTIC VALVE LVOT Vmax:   124.00 cm/s LVOT Vmean:  79.600 cm/s LVOT VTI:    0.201 m  AORTA Ao Asc diam: 2.80 cm MV E velocity:  82.70 cm/s MV A velocity: 114.00 cm/s  SHUNTS MV E/A  ratio:  0.73         Systemic VTI: 0.20 m Kirk Ruths MD Electronically signed by Kirk Ruths MD Signature Date/Time: 03/23/2020/3:06:17 PM    Final    VAS [place] LOWER EXTREMITY VENOUS (DVT)  Result Date: 03/23/2020  Lower Venous DVTStudy Indications: Pulmonary embolism.  Comparison Study: no prior Performing Technologist: Abram Sander RVS  Examination Guidelines: A complete evaluation includes B-mode imaging, spectral Doppler, color Doppler, and power Doppler as needed of all accessible portions of each vessel. Bilateral testing is considered an integral part of a complete examination. Limited examinations for reoccurring indications may be performed as noted. The reflux portion of the exam is performed with the patient in reverse Trendelenburg.  +---------+---------------+---------+-----------+----------+--------------+ RIGHT    CompressibilityPhasicitySpontaneityPropertiesThrombus Aging +---------+---------------+---------+-----------+----------+--------------+ CFV      Full           Yes      Yes                                 +---------+---------------+---------+-----------+----------+--------------+ SFJ      Full                                                        +---------+---------------+---------+-----------+----------+--------------+ FV Prox  Full                                                        +---------+---------------+---------+-----------+----------+--------------+ FV Mid   Full                                                        +---------+---------------+---------+-----------+----------+--------------+ FV DistalFull                                                        +---------+---------------+---------+-----------+----------+--------------+ PFV      Full                                                        +---------+---------------+---------+-----------+----------+--------------+ POP      Full           Yes      Yes                                  +---------+---------------+---------+-----------+----------+--------------+ PTV      Full                                                        +---------+---------------+---------+-----------+----------+--------------+  PERO     Full                                                        +---------+---------------+---------+-----------+----------+--------------+   +---------+---------------+---------+-----------+----------+--------------+ LEFT     CompressibilityPhasicitySpontaneityPropertiesThrombus Aging +---------+---------------+---------+-----------+----------+--------------+ CFV      Full           Yes      Yes                                 +---------+---------------+---------+-----------+----------+--------------+ SFJ      Full                                                        +---------+---------------+---------+-----------+----------+--------------+ FV Prox  Full                                                        +---------+---------------+---------+-----------+----------+--------------+ FV Mid   Full                                                        +---------+---------------+---------+-----------+----------+--------------+ FV DistalFull                                                        +---------+---------------+---------+-----------+----------+--------------+ PFV      Full                                                        +---------+---------------+---------+-----------+----------+--------------+ POP      Full           Yes      Yes                                 +---------+---------------+---------+-----------+----------+--------------+ PTV      Full                                                        +---------+---------------+---------+-----------+----------+--------------+ PERO     Full                                                         +---------+---------------+---------+-----------+----------+--------------+  Summary: BILATERAL: - No evidence of deep vein thrombosis seen in the lower extremities, bilaterally. - No evidence of superficial venous thrombosis in the lower extremities, bilaterally. -   *See table(s) above for measurements and observations. Electronically signed by Servando Snare MD on 03/23/2020 at 2:48:17 PM.    Final       Discharge Exam: Vitals:   03/25/20 0325 03/25/20 0903  BP: (!) 144/97 (!) 155/94  Pulse: 86 80  Resp: 19   Temp: 99.2 F (37.3 C)   SpO2: 93% 94%   Vitals:   03/24/20 1630 03/24/20 2129 03/25/20 0325 03/25/20 0903  BP: (!) 144/96 (!) 161/124 (!) 144/97 (!) 155/94  Pulse: 81 85 86 80  Resp: _0 Temp: 98.5 F (36.9 C) 98.9 F (37.2 C) 99.2 F (37.3 C)   TempSrc: Oral Oral Oral   SpO2: 90% 91% 93% 94%  Weight:   82.9 kg   Height:        General: Pt is alert, awake, not in acute distress Cardiovascular: RRR, S1/S2 +, no rubs, no gallops Respiratory: CTA bilaterally, no wheezing, no rhonchi Abdominal: Soft, NT, ND, bowel sounds + Extremities: no edema, no cyanosis    The results of significant diagnostics from this hospitalization (including imaging, microbiology, ancillary and laboratory) are listed below for reference.     Microbiology: Recent Results (from the past 240 hour(s))  SARS Coronavirus 2 by RT PCR (hospital order, performed in [facility] hospital lab) Nasopharyngeal Nasopharyngeal Swab     Status: None   Collection Time: 03/22/20  4:33 PM   Specimen: Nasopharyngeal Swab  Result Value Ref Range Status   SARS Coronavirus 2 NEGATIVE NEGATIVE Final    Comment: (NOTE) SARS-CoV-2 target nucleic acids are NOT DETECTED.  The SARS-CoV-2 RNA is generally detectable in upper and lower respiratory specimens during the acute phase of infection. The lowest concentration of SARS-CoV-2 viral copies this assay can detect is 250 copies / mL. A negative result  does not preclude SARS-CoV-2 infection and should not be used as the sole basis for treatment or other patient management decisions.  A negative result may occur with improper specimen collection / handling, submission of specimen other than nasopharyngeal swab, presence of viral mutation(s) within the areas targeted by this assay, and inadequate number of viral copies (<250 copies / mL). A negative result must be combined with clinical observations, patient history, and epidemiological information.  Fact Sheet for Patients:   StrictlyIdeas.no  Fact Sheet for Healthcare Providers: BankingDealers.co.za  This test is not yet approved or  cleared by the [place] FDA and has been authorized for detection and/or diagnosis of SARS-CoV-2 by FDA under an Emergency Use Authorization (EUA).  This EUA will remain in effect (meaning this test can be used) for the duration of the COVID-19 declaration under Section 564(b)(1) of the Act, 21 U.S.C. section 360bbb-3(b)(1), unless the authorization is terminated or revoked sooner.  Performed at [facility], [place] [address] [place], [place] [address]   C Difficile Quick Screen w PCR reflex     Status: None   Collection Time: 03/25/20  8:05 AM   Specimen: STOOL  Result Value Ref Range Status   C Diff antigen NEGATIVE NEGATIVE Final   C Diff toxin NEGATIVE NEGATIVE Final   C Diff interpretation No C. difficile detected.  Final    Comment: Performed at [facility], [place] [address], [place], [place] [address]     Labs: BNP (  last 3 results) No results for input(s): BNP in the last 8760 hours. Basic Metabolic Panel: Recent Labs  Lab 03/22/20 1444 03/22/20 1459 03/22/20 2244 03/23/20 0406 03/25/20 0750  NA 142 144  --  141 142  K 4.4 4.1  --  3.6 3.0*  CL 107 104  --  109 110  CO2 27  --   --  26 21*  GLUCOSE 102* 96  --  86 94  BUN 10 13  --  9 7  CREATININE 1.12* 1.10*  --   1.10* 0.90  CALCIUM 9.0  --   --  8.4* 9.2  MG  --   --  1.7  --   --    Liver Function Tests: Recent Labs  Lab 03/22/20 1444  AST 18  ALT 9  ALKPHOS 68  BILITOT 0.3  PROT 5.5*  ALBUMIN 2.7*   No results for input(s): LIPASE, AMYLASE in the last 168 hours. No results for input(s): AMMONIA in the last 168 hours. CBC: Recent Labs  Lab 03/22/20 1444 03/22/20 1459 03/23/20 0406 03/24/20 0514 03/25/20 0239  WBC 5.6  --  5.2 4.3 6.6  HGB 15.0 16.7* 13.3 13.8 16.1*  HCT 50.2* 49.0* 43.8 44.8 49.6*  MCV 102.2*  --  101.4* 97.4 95.9  PLT 127*  --  122* 121* 160   Cardiac Enzymes: No results for input(s): CKTOTAL, CKMB, CKMBINDEX, TROPONINI in the last 168 hours. BNP: Invalid input(s): POCBNP CBG: Recent Labs  Lab 03/22/20 1449 03/24/20 1209  GLUCAP 94 98   D-Dimer No results for input(s): DDIMER in the last 72 hours. Hgb A1c Recent Labs    03/22/20 1444  HGBA1C 5.8*   Lipid Profile No results for input(s): CHOL, HDL, LDLCALC, TRIG, CHOLHDL, LDLDIRECT in the last 72 hours. Thyroid function studies Recent Labs    03/22/20 1444  TSH 0.744   Anemia work up No results for input(s): VITAMINB12, FOLATE, FERRITIN, TIBC, IRON, RETICCTPCT in the last 72 hours. Urinalysis    Component Value Date/Time   COLORURINE YELLOW 03/22/2020 1650   APPEARANCEUR CLEAR 03/22/2020 1650   LABSPEC >1.046 (H) 03/22/2020 1650   PHURINE 5.0 03/22/2020 1650   GLUCOSEU NEGATIVE 03/22/2020 1650   HGBUR NEGATIVE 03/22/2020 1650   BILIRUBINUR NEGATIVE 03/22/2020 1650   KETONESUR NEGATIVE 03/22/2020 1650   PROTEINUR NEGATIVE 03/22/2020 1650   NITRITE NEGATIVE 03/22/2020 1650   LEUKOCYTESUR NEGATIVE 03/22/2020 1650   Sepsis Labs Invalid input(s): PROCALCITONIN,  WBC,  LACTICIDVEN Microbiology Recent Results (from the past 240 hour(s))  SARS Coronavirus 2 by RT PCR (hospital order, performed in [place] hospital lab) Nasopharyngeal Nasopharyngeal Swab     Status: None   Collection  Time: 03/22/20  4:33 PM   Specimen: Nasopharyngeal Swab  Result Value Ref Range Status   SARS Coronavirus 2 NEGATIVE NEGATIVE Final    Comment: (NOTE) SARS-CoV-2 target nucleic acids are NOT DETECTED.  The SARS-CoV-2 RNA is generally detectable in upper and lower respiratory specimens during the acute phase of infection. The lowest concentration of SARS-CoV-2 viral copies this assay can detect is 250 copies / mL. A negative result does not preclude SARS-CoV-2 infection and should not be used as the sole basis for treatment or other patient management decisions.  A negative result may occur with improper specimen collection / handling, submission of specimen other than nasopharyngeal swab, presence of viral mutation(s) within the areas targeted by this assay, and inadequate number of viral copies (<250 copies / mL). A negative result  must be combined with clinical observations, patient history, and epidemiological information.  Fact Sheet for Patients:   StrictlyIdeas.no  Fact Sheet for Healthcare Providers: BankingDealers.co.za  This test is not yet approved or  cleared by the [place] FDA and has been authorized for detection and/or diagnosis of SARS-CoV-2 by FDA under an Emergency Use Authorization (EUA).  This EUA will remain in effect (meaning this test can be used) for the duration of the COVID-19 declaration under Section 564(b)(1) of the Act, 21 U.S.C. section 360bbb-3(b)(1), unless the authorization is terminated or revoked sooner.  Performed at [facility], [place] [address], [place], [place] [address]   C Difficile Quick Screen w PCR reflex     Status: None   Collection Time: 03/25/20  8:05 AM   Specimen: STOOL  Result Value Ref Range Status   C Diff antigen NEGATIVE NEGATIVE Final   C Diff toxin NEGATIVE NEGATIVE Final   C Diff interpretation No C. difficile detected.  Final    Comment: Performed at [facility], [place] [address] [place], [place] [address]     Time coordinating discharge: Over 30 minutes  SIGNED:   Darliss Cheney, MD  [facility] 03/25/2020, 10:33 AM  If 7PM-7AM, please contact night-coverage [email or web address]

## 2020-03-27 ENCOUNTER — Encounter (HOSPITAL_COMMUNITY): Payer: Self-pay | Admitting: Emergency Medicine

## 2020-03-28 ENCOUNTER — Telehealth: Payer: Self-pay | Admitting: *Deleted

## 2020-03-28 NOTE — Telephone Encounter (Signed)
Patient enrolled for [employer] to ship a 30 day cardiac event monitor to her home.  Instructions reviewed briefly as they are included in her monitor kit.  Forward results to Dr. Helene Kelp, [place], [place].

## 2020-03-31 ENCOUNTER — Other Ambulatory Visit: Payer: Self-pay

## 2020-03-31 ENCOUNTER — Emergency Department (HOSPITAL_COMMUNITY): Payer: Medicare HMO

## 2020-03-31 ENCOUNTER — Encounter (HOSPITAL_COMMUNITY): Payer: Self-pay | Admitting: Internal Medicine

## 2020-03-31 ENCOUNTER — Inpatient Hospital Stay (HOSPITAL_COMMUNITY)
Admission: EM | Admit: 2020-03-31 | Discharge: 2020-04-02 | DRG: 189 | Disposition: A | Discharge status: Home / Self Care | Payer: Medicare HMO | Attending: Internal Medicine | Admitting: Internal Medicine

## 2020-03-31 DIAGNOSIS — B370 Candidal stomatitis: Secondary | ICD-10-CM | POA: Diagnosis present

## 2020-03-31 DIAGNOSIS — G894 Chronic pain syndrome: Secondary | ICD-10-CM | POA: Diagnosis present

## 2020-03-31 DIAGNOSIS — E7800 Pure hypercholesterolemia, unspecified: Secondary | ICD-10-CM | POA: Diagnosis present

## 2020-03-31 DIAGNOSIS — Z720 Tobacco use: Secondary | ICD-10-CM | POA: Diagnosis present

## 2020-03-31 DIAGNOSIS — Z7901 Long term (current) use of anticoagulants: Secondary | ICD-10-CM | POA: Diagnosis not present

## 2020-03-31 DIAGNOSIS — Z7952 Long term (current) use of systemic steroids: Secondary | ICD-10-CM

## 2020-03-31 DIAGNOSIS — Z79899 Other long term (current) drug therapy: Secondary | ICD-10-CM | POA: Diagnosis not present

## 2020-03-31 DIAGNOSIS — I110 Hypertensive heart disease with heart failure: Secondary | ICD-10-CM | POA: Diagnosis present

## 2020-03-31 DIAGNOSIS — Z886 Allergy status to analgesic agent status: Secondary | ICD-10-CM | POA: Diagnosis not present

## 2020-03-31 DIAGNOSIS — Z20822 Contact with and (suspected) exposure to covid-19: Secondary | ICD-10-CM | POA: Diagnosis present

## 2020-03-31 DIAGNOSIS — Z79891 Long term (current) use of opiate analgesic: Secondary | ICD-10-CM | POA: Diagnosis not present

## 2020-03-31 DIAGNOSIS — G9341 Metabolic encephalopathy: Secondary | ICD-10-CM | POA: Diagnosis present

## 2020-03-31 DIAGNOSIS — I2510 Atherosclerotic heart disease of native coronary artery without angina pectoris: Secondary | ICD-10-CM | POA: Diagnosis present

## 2020-03-31 DIAGNOSIS — M797 Fibromyalgia: Secondary | ICD-10-CM | POA: Diagnosis present

## 2020-03-31 DIAGNOSIS — I2699 Other pulmonary embolism without acute cor pulmonale: Secondary | ICD-10-CM | POA: Diagnosis not present

## 2020-03-31 DIAGNOSIS — I959 Hypotension, unspecified: Secondary | ICD-10-CM | POA: Diagnosis present

## 2020-03-31 DIAGNOSIS — Z86711 Personal history of pulmonary embolism: Secondary | ICD-10-CM

## 2020-03-31 DIAGNOSIS — E876 Hypokalemia: Secondary | ICD-10-CM | POA: Diagnosis present

## 2020-03-31 DIAGNOSIS — R4182 Altered mental status, unspecified: Secondary | ICD-10-CM

## 2020-03-31 DIAGNOSIS — J9602 Acute respiratory failure with hypercapnia: Principal | ICD-10-CM | POA: Diagnosis present

## 2020-03-31 DIAGNOSIS — Z951 Presence of aortocoronary bypass graft: Secondary | ICD-10-CM

## 2020-03-31 DIAGNOSIS — I5032 Chronic diastolic (congestive) heart failure: Secondary | ICD-10-CM | POA: Diagnosis present

## 2020-03-31 DIAGNOSIS — Z8249 Family history of ischemic heart disease and other diseases of the circulatory system: Secondary | ICD-10-CM | POA: Diagnosis not present

## 2020-03-31 DIAGNOSIS — F17210 Nicotine dependence, cigarettes, uncomplicated: Secondary | ICD-10-CM | POA: Diagnosis present

## 2020-03-31 DIAGNOSIS — Z7902 Long term (current) use of antithrombotics/antiplatelets: Secondary | ICD-10-CM | POA: Diagnosis not present

## 2020-03-31 DIAGNOSIS — E872 Acidosis: Secondary | ICD-10-CM | POA: Diagnosis present

## 2020-03-31 DIAGNOSIS — Z888 Allergy status to other drugs, medicaments and biological substances status: Secondary | ICD-10-CM

## 2020-03-31 DIAGNOSIS — N183 Chronic kidney disease, stage 3 unspecified: Secondary | ICD-10-CM | POA: Diagnosis present

## 2020-03-31 DIAGNOSIS — I10 Essential (primary) hypertension: Secondary | ICD-10-CM | POA: Diagnosis present

## 2020-03-31 LAB — COMPREHENSIVE METABOLIC PANEL
ALT: 17 U/L (ref 0–44)
AST: 22 U/L (ref 15–41)
Albumin: 2.5 g/dL — ABNORMAL LOW (ref 3.5–5.0)
Alkaline Phosphatase: 49 U/L (ref 38–126)
Anion gap: 9 (ref 5–15)
BUN: 8 mg/dL (ref 6–20)
CO2: 27 mmol/L (ref 22–32)
Calcium: 8.4 mg/dL — ABNORMAL LOW (ref 8.9–10.3)
Chloride: 109 mmol/L (ref 98–111)
Creatinine, Ser: 0.92 mg/dL (ref 0.44–1.00)
GFR calc Af Amer: >60 mL/min (ref >60)
GFR calc non Af Amer: >60 mL/min (ref >60)
Glucose, Bld: 121 mg/dL — ABNORMAL HIGH (ref 70–99)
Potassium: 2.9 mmol/L — ABNORMAL LOW (ref 3.5–5.1)
Sodium: 145 mmol/L (ref 135–145)
Total Bilirubin: 0.1 mg/dL — ABNORMAL LOW (ref 0.3–1.2)
Total Protein: 5 g/dL — ABNORMAL LOW (ref 6.5–8.1)

## 2020-03-31 LAB — I-STAT ARTERIAL BLOOD GAS, ED
Acid-base deficit: 1 mmol/L (ref 0.0–2.0)
Bicarbonate: 27.1 mmol/L (ref 20.0–28.0)
Calcium, Ion: 1.2 mmol/L (ref 1.15–1.40)
HCT: 38 % (ref 36.0–46.0)
Hemoglobin: 12.9 g/dL (ref 12.0–15.0)
O2 Saturation: 88 %
Patient temperature: 98.6
Potassium: 3.8 mmol/L (ref 3.5–5.1)
Sodium: 143 mmol/L (ref 135–145)
TCO2: 29 mmol/L (ref 22–32)
pCO2 arterial: 59.4 mmHg — ABNORMAL HIGH (ref 32.0–48.0)
pH, Arterial: 7.267 — ABNORMAL LOW (ref 7.350–7.450)
pO2, Arterial: 64 mmHg — ABNORMAL LOW (ref 83.0–108.0)

## 2020-03-31 LAB — URINALYSIS, ROUTINE W REFLEX MICROSCOPIC
Bilirubin Urine: NEGATIVE
Glucose, UA: NEGATIVE mg/dL
Hgb urine dipstick: NEGATIVE
Ketones, ur: NEGATIVE mg/dL
Leukocytes,Ua: NEGATIVE
Nitrite: NEGATIVE
Protein, ur: NEGATIVE mg/dL
Specific Gravity, Urine: 1.041 — ABNORMAL HIGH (ref 1.005–1.030)
pH: 5 (ref 5.0–8.0)

## 2020-03-31 LAB — CBC WITH DIFFERENTIAL/PLATELET
Abs Immature Granulocytes: 0.03 10*3/uL (ref 0.00–0.07)
Basophils Absolute: 0 10*3/uL (ref 0.0–0.1)
Basophils Relative: 0 %
Eosinophils Absolute: 0.1 10*3/uL (ref 0.0–0.5)
Eosinophils Relative: 2 %
HCT: 44.2 % (ref 36.0–46.0)
Hemoglobin: 13.3 g/dL (ref 12.0–15.0)
Immature Granulocytes: 1 %
Lymphocytes Relative: 37 %
Lymphs Abs: 2.4 10*3/uL (ref 0.7–4.0)
MCH: 30.6 pg (ref 26.0–34.0)
MCHC: 30.1 g/dL (ref 30.0–36.0)
MCV: 101.8 fL — ABNORMAL HIGH (ref 80.0–100.0)
Monocytes Absolute: 0.5 10*3/uL (ref 0.1–1.0)
Monocytes Relative: 7 %
Neutro Abs: 3.5 10*3/uL (ref 1.7–7.7)
Neutrophils Relative %: 53 %
Platelets: 158 10*3/uL (ref 150–400)
RBC: 4.34 MIL/uL (ref 3.87–5.11)
RDW: 17.1 % — ABNORMAL HIGH (ref 11.5–15.5)
WBC: 6.6 10*3/uL (ref 4.0–10.5)
nRBC: 0 % (ref 0.0–0.2)

## 2020-03-31 LAB — RAPID URINE DRUG SCREEN, HOSP PERFORMED
Amphetamines: NOT DETECTED
Barbiturates: NOT DETECTED
Benzodiazepines: NOT DETECTED
Cocaine: NOT DETECTED
Opiates: POSITIVE — AB
Tetrahydrocannabinol: NOT DETECTED

## 2020-03-31 LAB — LACTIC ACID, PLASMA: Lactic Acid, Venous: 1.9 mmol/L (ref 0.5–1.9)

## 2020-03-31 LAB — MAGNESIUM: Magnesium: 1.7 mg/dL (ref 1.7–2.4)

## 2020-03-31 LAB — TROPONIN I (HIGH SENSITIVITY)
Troponin I (High Sensitivity): 6 ng/L (ref <18)
Troponin I (High Sensitivity): 12 ng/L (ref <18)

## 2020-03-31 LAB — PROTIME-INR
INR: 1.3 — ABNORMAL HIGH (ref 0.8–1.2)
Prothrombin Time: 15.8 seconds — ABNORMAL HIGH (ref 11.4–15.2)

## 2020-03-31 LAB — CBG MONITORING, ED: Glucose-Capillary: 102 mg/dL — ABNORMAL HIGH (ref 70–99)

## 2020-03-31 LAB — AMMONIA: Ammonia: 41 umol/L — ABNORMAL HIGH (ref 9–35)

## 2020-03-31 LAB — SARS CORONAVIRUS 2 BY RT PCR (HOSPITAL ORDER, PERFORMED IN ~~LOC~~ HOSPITAL LAB): SARS Coronavirus 2: NEGATIVE

## 2020-03-31 MED ORDER — PANTOPRAZOLE SODIUM 40 MG PO TBEC
40.0000 mg | DELAYED_RELEASE_TABLET | Freq: Every day | ORAL | Status: DC
  Administered 2020-04-01 – 2020-04-02 (×2): 40 mg via ORAL
  Filled 2020-03-31 (×2): qty 1

## 2020-03-31 MED ORDER — APIXABAN 5 MG PO TABS
5.0000 mg | ORAL_TABLET | Freq: Two times a day (BID) | ORAL | Status: DC
  Administered 2020-04-01 – 2020-04-02 (×3): 5 mg via ORAL
  Filled 2020-03-31 (×3): qty 1

## 2020-03-31 MED ORDER — LACTATED RINGERS IV SOLN: INTRAVENOUS | Status: DC

## 2020-03-31 MED ORDER — ONDANSETRON HCL 4 MG/2ML IJ SOLN
4.0000 mg | Freq: Four times a day (QID) | INTRAMUSCULAR | Status: DC | PRN
  Administered 2020-04-02: 4 mg via INTRAVENOUS
  Filled 2020-03-31: qty 2

## 2020-03-31 MED ORDER — SODIUM CHLORIDE 0.9 % IV BOLUS
1000.0000 mL | Freq: Once | INTRAVENOUS | Status: AC
  Administered 2020-03-31: 1000 mL via INTRAVENOUS

## 2020-03-31 MED ORDER — ROSUVASTATIN CALCIUM 20 MG PO TABS
40.0000 mg | ORAL_TABLET | Freq: Every day | ORAL | Status: DC
  Administered 2020-04-01 – 2020-04-02 (×2): 40 mg via ORAL
  Filled 2020-03-31 (×2): qty 2

## 2020-03-31 MED ORDER — METOPROLOL TARTRATE 25 MG PO TABS
25.0000 mg | ORAL_TABLET | Freq: Two times a day (BID) | ORAL | Status: DC
  Administered 2020-04-01 – 2020-04-02 (×3): 25 mg via ORAL
  Filled 2020-03-31 (×3): qty 1

## 2020-03-31 MED ORDER — CLOPIDOGREL BISULFATE 75 MG PO TABS
75.0000 mg | ORAL_TABLET | Freq: Every day | ORAL | Status: DC
  Filled 2020-03-31: qty 1

## 2020-03-31 MED ORDER — POLYETHYLENE GLYCOL 3350 17 G PO PACK: 17.0000 g | PACK | Freq: Every day | ORAL | Status: DC | PRN

## 2020-03-31 MED ORDER — ACETAMINOPHEN 325 MG PO TABS
650.0000 mg | ORAL_TABLET | Freq: Four times a day (QID) | ORAL | Status: DC | PRN
  Administered 2020-04-01: 650 mg via ORAL
  Filled 2020-03-31: qty 2

## 2020-03-31 MED ORDER — MAGNESIUM SULFATE IN D5W 1-5 GM/100ML-% IV SOLN
1.0000 g | Freq: Once | INTRAVENOUS | Status: AC
  Administered 2020-04-01: 1 g via INTRAVENOUS
  Filled 2020-03-31: qty 100

## 2020-03-31 MED ORDER — DICYCLOMINE HCL 20 MG PO TABS
20.0000 mg | ORAL_TABLET | Freq: Three times a day (TID) | ORAL | Status: DC
  Administered 2020-04-01 – 2020-04-02 (×4): 20 mg via ORAL
  Filled 2020-03-31 (×6): qty 1

## 2020-03-31 MED ORDER — POTASSIUM CHLORIDE 2 MEQ/ML IV SOLN
INTRAVENOUS | Status: DC
  Filled 2020-03-31 (×2): qty 1000

## 2020-03-31 MED ORDER — ACETAMINOPHEN 650 MG RE SUPP: 650.0000 mg | Freq: Four times a day (QID) | RECTAL | Status: DC | PRN

## 2020-03-31 MED ORDER — POTASSIUM CHLORIDE CRYS ER 20 MEQ PO TBCR
40.0000 meq | EXTENDED_RELEASE_TABLET | Freq: Once | ORAL | Status: AC
  Administered 2020-03-31: 40 meq via ORAL
  Filled 2020-03-31: qty 2

## 2020-03-31 MED ORDER — KCL-LACTATED RINGERS-D5W 20 MEQ/L IV SOLN
INTRAVENOUS | Status: DC
Start: 2020-03-31 — End: 2020-03-31

## 2020-03-31 MED ORDER — ONDANSETRON HCL 4 MG PO TABS: 4.0000 mg | ORAL_TABLET | Freq: Four times a day (QID) | ORAL | Status: DC | PRN

## 2020-03-31 MED ORDER — ESCITALOPRAM OXALATE 20 MG PO TABS
20.0000 mg | ORAL_TABLET | Freq: Every day | ORAL | Status: DC
  Administered 2020-04-01 – 2020-04-02 (×2): 20 mg via ORAL
  Filled 2020-03-31 (×2): qty 1

## 2020-03-31 MED ORDER — POTASSIUM CHLORIDE 10 MEQ/100ML IV SOLN
10.0000 meq | INTRAVENOUS | Status: AC
  Administered 2020-04-01 (×2): 10 meq via INTRAVENOUS
  Filled 2020-03-31 (×2): qty 100

## 2020-03-31 MED ORDER — IOHEXOL 350 MG/ML SOLN
75.0000 mL | Freq: Once | INTRAVENOUS | Status: AC | PRN
  Administered 2020-03-31: 75 mL via INTRAVENOUS

## 2020-03-31 NOTE — Progress Notes (Signed)
RT in to collect repeat ABG post BIPAP initiation. Upon arrival to room, pt had BIPAP off, talking to pharmacy staff. Pt on RA, did not appear to be confused at this time, and did not appear to be in any obvious respiratory distress. RT notified ED RT of pt status, and that repeat ABG was not collected at this time. RT will continue to monitor.

## 2020-03-31 NOTE — H&P (Addendum)
History and Physical    Selena Clark VVO:[id number] DOB: 1960-08-13 DOA: 03/31/2020  PCP: Regino Bellow, MD  Patient coming from: home via EMS   Chief Complaint:  Chief Complaint  Patient presents with  . Altered Mental Status     HPI:    58 year old female with past medical history of coronary artery disease status post CABG in 2007, irritable bowel syndrome, hypertension, anxiety disorder, chronic pain syndrome, hyperlipidemia, peripheral artery disease (S/P stenting of the B/L LLE unknown vessels), nicotine dependence,  diastolic congestive heart failure (Echo 03/22/2020 EF 60-65%) [facility] emergency department after being found minimally responsive by her husband.  While patient is currently arousable, she is an extremely poor historian and the majority of history is been obtained from the husband in addition to review of emergency department records.  Earlier in the day the patient was in her usual state of health.  Patient had laid down on her couch in the afternoon for a nap.  When her husband came to check on her short time later he found that she was minimally responsive prompting him to call EMS.  Upon EMS arrival, they report the patient was hypotensive with systolic blood pressures in the 60s with agonal breathing requiring temporarily bagging the patient.  Patient was immediately brought to [facility] emergency department for evaluation.  Upon evaluation in the emergency department patient was hypotensive requiring administration of 4 L of isotonic fluids.  Patient underwent CT imaging of the chest that revealed no evidence of pneumonia.  Urine toxicology screen was ordered and is pending.  ABG was performed revealing acute hypercapnic respiratory failure for which the patient was placed on BiPAP therapy.  The hospitalist group was then called to assess patient for admission to the hospital.   Review of Systems:   Unable to perform due to severe lethargy  and confusion.   Past Medical History:  Diagnosis Date  . CAD (coronary artery disease)   . Coronary artery disease   . Depression   . Fibromyalgia   . Headache   . HTN (hypertension)   . Hx of CABG   . Hypercholesteremia   . Hypertension     Past Surgical History:  Procedure Laterality Date  . BACK SURGERY    . CARDIAC SURGERY    . CARPAL TUNNEL RELEASE    . CHOLECYSTECTOMY    . CORONARY ARTERY BYPASS GRAFT    . HERNIA REPAIR    . TUBAL LIGATION       reports that she has been smoking cigarettes. She has been smoking about 1.00 pack per day. She has never used smokeless tobacco. She reports that she does not drink alcohol and does not use drugs.  Allergies  Allergen Reactions  . Albuterol Nausea And Vomiting  . Aspirin Hives    Family History  Problem Relation Age of Onset  . Hypertension Mother      Prior to Admission medications   Medication Sig Start Date End Date Taking? Authorizing Provider  apixaban (ELIQUIS) 5 MG TABS tablet Take 2 tablets (10 mg total) by mouth 2 (two) times daily for 5 days. 03/25/20 03/30/20  Hughie Closs, MD  apixaban (ELIQUIS) 5 MG TABS tablet Take 1 tablet (5 mg total) by mouth 2 (two) times daily. 03/29/20 04/28/20  Hughie Closs, MD  atenolol (TENORMIN) 50 MG tablet Take 100 mg by mouth daily.    [provider]  atorvastatin (LIPITOR) 80 MG tablet Take 80 mg by mouth at  bedtime.     [provider]  chlorpheniramine-HYDROcodone (TUSSIONEX PENNKINETIC ER) 10-8 MG/5ML SUER Take 5 mLs by mouth every 12 (twelve) hours as needed for cough. 12/02/16   Trixie Dredge, PA-C  clopidogrel (PLAVIX) 75 MG tablet Take 75 mg by mouth at bedtime.     [provider]  cyclobenzaprine (FLEXERIL) 10 MG tablet Take 30 mg by mouth at bedtime.     [provider]  dicyclomine (BENTYL) 20 MG tablet Take 20 mg by mouth 3 (three) times daily. 02/20/20   [provider]  escitalopram (LEXAPRO) 10 MG tablet Take 20 mg by  mouth at bedtime.     [provider]  escitalopram (LEXAPRO) 20 MG tablet Take 20 mg by mouth daily. 03/22/20   [provider]  gabapentin (NEURONTIN) 400 MG capsule Take 400 mg by mouth in the morning, at noon, and at bedtime. 02/29/20   [provider]  methylPREDNISolone (MEDROL DOSEPAK) 4 MG tablet 6po day 1, then decrease by 1 tab per day 04/18/13   Rolland Porter, MD  metoprolol tartrate (LOPRESSOR) 25 MG tablet Take 25 mg by mouth 2 (two) times daily. 03/16/20   [provider]  ondansetron (ZOFRAN) 4 MG tablet Take 1 tablet (4 mg total) by mouth every 8 (eight) hours as needed for nausea or vomiting. 12/02/16   Trixie Dredge, PA-C  oxyCODONE-acetaminophen (PERCOCET) 7.5-325 MG per tablet Take 1 tablet by mouth every 6 (six) hours as needed for pain.    [provider]  pantoprazole (PROTONIX) 20 MG tablet Take 20 mg by mouth 2 (two) times daily.    [provider]  pantoprazole (PROTONIX) 40 MG tablet Take 40 mg by mouth daily. 02/23/20   [provider]  pregabalin (LYRICA) 150 MG capsule Take 150 mg by mouth 3 (three) times daily.     [provider]  rosuvastatin (CRESTOR) 40 MG tablet Take 40 mg by mouth daily. 01/25/20   [provider]  tiZANidine (ZANAFLEX) 2 MG tablet Take 2 mg by mouth at bedtime.     [provider]  tiZANidine (ZANAFLEX) 4 MG tablet Take 4 mg by mouth 3 (three) times daily. 02/20/20   [provider]  topiramate (TOPAMAX) 100 MG tablet Take 100 mg by mouth at bedtime.  04/12/13   [provider]  topiramate (TOPAMAX) 100 MG tablet Take 100 mg by mouth daily. 03/07/20   [provider]  traMADol (ULTRAM-ER) 200 MG 24 hr tablet Take 200 mg by mouth daily as needed for pain.  04/12/13   [provider]  traZODone (DESYREL) 100 MG tablet Take 200 mg by mouth at bedtime as needed for sleep. 03/02/20   [provider]  traZODone (DESYREL) 150 MG tablet  Take 150 mg by mouth at bedtime as needed for sleep. Takes either trazodone or zolpidem    [provider]  XTAMPZA ER 13.5 MG C12A Take 1 capsule by mouth 2 (two) times daily. 10/08/19   [provider]  XTAMPZA ER 18 MG C12A Take 1 capsule by mouth 2 (two) times daily. 03/20/20   [provider]  zolpidem (AMBIEN) 10 MG tablet Take 10 mg by mouth at bedtime as needed for sleep. Takes either zolpidem or trazodone    [provider]    Physical Exam: Vitals:   03/31/20 1915 03/31/20 1945 03/31/20 2131 03/31/20 2146  BP:  116/77  120/85  Pulse: 78 (!) 54 62   Resp: 19 (!) 22 (!)  24   Temp:      TempSrc:      SpO2: 97% 95% 100%     Constitutional: Lethargic but arousable, oriented x2, patient is not in any acute distress. Skin: no rashes, no lesions, good skin turgor noted. Eyes: Pupils are equally reactive to light.  No evidence of scleral icterus or conjunctival pallor.  ENMT: Moist mucous membranes noted.  Posterior pharynx clear of any exudate or lesions.   Neck: normal, supple, no masses, no thyromegaly.  No evidence of jugular venous distension.   Respiratory: clear to auscultation bilaterally, no wheezing, mild bibasilar rales noted, normal respiratory effort. No accessory muscle use.  Cardiovascular: Regular rate and rhythm, no murmurs / rubs / gallops. No extremity edema. 2+ pedal pulses. No carotid bruits.  Chest:   Nontender without crepitus or deformity.   Back:   Nontender without crepitus or deformity. Abdomen: Abdomen is soft and nontender.  No evidence of intra-abdominal masses.  Positive bowel sounds noted in all quadrants.   Musculoskeletal: No joint deformity upper and lower extremities. Good ROM, no contractures. Normal muscle tone.  Neurologic: Patient is extremely lethargic but arousable and oriented x2.  Patient is following commands consistently.  Patient sensation is grossly intact and is moving all 4 extremities spontaneously.     Patient is responsive to verbal stimuli.   Psychiatric: Patient exhibits a depressed mood with flat affect.  I do not currently believe that the patient possesses insight as to her current situation.  Labs on Admission: I have personally reviewed following labs and imaging studies -   CBC: Recent Labs  Lab 03/25/20 0239 03/31/20 1627 03/31/20 2108  WBC 6.6 6.6  --   NEUTROABS  --  3.5  --   HGB 16.1* 13.3 12.9  HCT 49.6* 44.2 38.0  MCV 95.9 101.8*  --   PLT 160 158  --    Basic Metabolic Panel: Recent Labs  Lab 03/25/20 0750 03/31/20 1627 03/31/20 2030 03/31/20 2108  NA 142 145  --  143  K 3.0* 2.9*  --  3.8  CL 110 109  --   --   CO2 21* 27  --   --   GLUCOSE 94 121*  --   --   BUN 7 8  --   --   CREATININE 0.90 0.92  --   --   CALCIUM 9.2 8.4*  --   --   MG  --   --  1.7  --    GFR: Estimated Creatinine Clearance: 67.3 mL/min (by C-G formula based on SCr of 0.92 mg/dL). Liver Function Tests: Recent Labs  Lab 03/31/20 1627  AST 22  ALT 17  ALKPHOS 49  BILITOT 0.1*  PROT 5.0*  ALBUMIN 2.5*   No results for input(s): LIPASE, AMYLASE in the last 168 hours. Recent Labs  Lab 03/31/20 2030  AMMONIA 41*   Coagulation Profile: Recent Labs  Lab 03/31/20 1627  INR 1.3*   Cardiac Enzymes: No results for input(s): CKTOTAL, CKMB, CKMBINDEX, TROPONINI in the last 168 hours. BNP (last 3 results) No results for input(s): PROBNP in the last 8760 hours. HbA1C: No results for input(s): HGBA1C in the last 72 hours. CBG: Recent Labs  Lab 03/31/20 1647  GLUCAP 102*   Lipid Profile: No results for input(s): CHOL, HDL, LDLCALC, TRIG, CHOLHDL, LDLDIRECT in the last 72 hours. Thyroid Function Tests: No results for input(s): TSH, T4TOTAL, FREET4, T3FREE, THYROIDAB in the last 72 hours. Anemia Panel: No results for  input(s): VITAMINB12, FOLATE, FERRITIN, TIBC, IRON, RETICCTPCT in the last 72 hours. Urine analysis:    Component Value Date/Time   COLORURINE YELLOW  03/31/2020 1944   APPEARANCEUR CLEAR 03/31/2020 1944   LABSPEC 1.041 (H) 03/31/2020 1944   PHURINE 5.0 03/31/2020 1944   GLUCOSEU NEGATIVE 03/31/2020 1944   HGBUR NEGATIVE 03/31/2020 1944   BILIRUBINUR NEGATIVE 03/31/2020 1944   KETONESUR NEGATIVE 03/31/2020 1944   PROTEINUR NEGATIVE 03/31/2020 1944   NITRITE NEGATIVE 03/31/2020 1944   LEUKOCYTESUR NEGATIVE 03/31/2020 1944    Radiological Exams on Admission - Personally Reviewed: DG Chest 1 View  Result Date: 03/31/2020 CLINICAL DATA:  Syncope. EXAM: CHEST  1 VIEW COMPARISON:  March 22, 2020. FINDINGS: The heart size and mediastinal contours are within normal limits. No pneumothorax or pleural effusion is noted. Right lung is clear. Mild left basilar atelectasis or scarring is noted. The visualized skeletal structures are unremarkable. IMPRESSION: Mild left basilar atelectasis or scarring. Electronically Signed   By: Lupita Raider M.D.   On: 03/31/2020 16:46   CT Head Wo Contrast  Result Date: 03/31/2020 CLINICAL DATA:  Altered mental status and recent syncopal episode EXAM: CT HEAD WITHOUT CONTRAST TECHNIQUE: Contiguous axial images were obtained from the base of the skull through the vertex without intravenous contrast. COMPARISON:  03/22/2020 FINDINGS: Brain: No evidence of acute infarction, hemorrhage, hydrocephalus, extra-axial collection or mass lesion/mass effect. Vascular: No hyperdense vessel or unexpected calcification. Skull: Normal. Negative for fracture or focal lesion. Sinuses/Orbits: No acute finding. Other: None. IMPRESSION: No acute intracranial abnormality noted. Electronically Signed   By: Alcide Clever M.D.   On: 03/31/2020 19:21   CT Angio Chest PE W and/or Wo Contrast  Result Date: 03/31/2020 CLINICAL DATA:  Syncope. EXAM: CT ANGIOGRAPHY CHEST WITH CONTRAST TECHNIQUE: Multidetector CT imaging of the chest was performed using the standard protocol during bolus administration of intravenous contrast. Multiplanar CT image  reconstructions and MIPs were obtained to evaluate the vascular anatomy. CONTRAST:  75mL OMNIPAQUE IOHEXOL 350 MG/ML SOLN COMPARISON:  None. FINDINGS: Cardiovascular: There is mild calcification of the aortic arch. A mild amount of artifact is seen involving the posterior lower lobe branches of the bilateral pulmonary arteries. There is no definite evidence of pulmonary embolism. Normal heart size. No pericardial effusion. Mediastinum/Nodes: No enlarged mediastinal, hilar, or axillary lymph nodes. Thyroid gland, trachea, and esophagus demonstrate no significant findings. Lungs/Pleura: Mild areas of atelectasis are seen along the posterior aspects of the bilateral lower lobes. There is no evidence of a pleural effusion or pneumothorax. Upper Abdomen: The gallbladder is absent. Musculoskeletal: No chest wall abnormality. No acute or significant osseous findings. Review of the MIP images confirms the above findings. IMPRESSION: 1. Areas of artifact involving the posterior lower lobe branches of the bilateral pulmonary arteries, without definite evidence of pulmonary embolism. 2. Mild bilateral lower lobe atelectasis. 3. Aortic atherosclerosis. Aortic Atherosclerosis (ICD10-I70.0). Electronically Signed   By: Aram Candela M.D.   On: 03/31/2020 18:58    EKG: Personally reviewed.  Rhythm is normal sinus rhythm with heart rate of 72 bpm.  No dynamic ST segment changes appreciated.  Assessment/Plan Principal Problem:   Acute hypercapnic respiratory failure (HCC)   ABG reveals evidence of acute hypercapnic respiratory failure with pH 7.26 and PCO2 59.4.  Etiology is unclear at this time, although patient does take relatively high-dose opiates in the home setting.  Patient denies misuse of these medications per my discussion with her.  Patient has been placed on BiPAP therapy, will perform  serial ABG after patient is on BiPAP for 1 hour and titrate as necessary  Admitting patient to stepdown unit  CT  chest reveals no evidence of acute cardiopulmonary process, recent diagnosis of pulmonary emboli seem to be improving.  Close clinical monitoring, supportive care  Active Problems:   Acute metabolic encephalopathy   Patient severe lethargy and minimal responsiveness upon arrival are likely related to patient's acute hypercarbic respiratory failure.  Likely multifactorial with a component of hypercapnic encephalopathy  Urine toxicology screen positive for opiates although she is on these at home  CT head unremarkable, ammonia minimally elevated, urinalysis reveals no evidence of urinary tract infection.  Hydrating patient with intravenous isotonic fluids  If patient fails to clinically improve will expand work-up.    Acute pulmonary embolism (HCC)   Continue home regimen of apixaban    Hypokalemia   Replacing via both oral and intravenous supplementation  Monitoring potassium levels with serial chemistries  Magnesium 1.7    Coronary artery disease involving native heart without angina pectoris   Continue home regimen of antiplatelet therapy, statin therapy  Patient is currently chest pain-free  Monitoring patient on telemetry  Holding tonight's dose of beta-blocker therapy, will restart tomorrow with parameters    Nicotine dependence, cigarettes, uncomplicated  Patient is clinically improved will counsel daily on cessation.  Essential hypertension  . Holding tonight's regimen of metoprolol considering substantial hypotension on arrival to the emergency department . Hypotension has since resolved with intravenous fluids and may be transient due to medication effect . We will restart regimen tomorrow with parameters   Chronic pain syndrome   Holding home regimen of opiate-based analgesics for now until symptoms resolve  Clinical presentation concerning for accidental overdose or misuse, patient denies misuse  Will review medications with patient when she is  more lucid and revisit this topic      Code Status:  Full code Family Communication: Discussed with husband who has been updated on plan of care.    Status is: Inpatient  Remains inpatient appropriate because:Altered mental status, IV treatments appropriate due to intensity of illness or inability to take PO and Inpatient level of care appropriate due to severity of illness   Dispo: The patient is from: Home              Anticipated d/c is to: Home              Anticipated d/c date is: > 3 days              Patient currently is not medically stable to d/c.        Marinda Elk MD [facility] Pager [phone]  If 7PM-7AM, please contact night-coverage [email or web address] Use universal [place] password for that web site. If you do not have the password, please call the hospital operator.  03/31/2020, 9:55 PM

## 2020-03-31 NOTE — ED Notes (Signed)
Hospital bed ordered for pt.

## 2020-03-31 NOTE — Progress Notes (Signed)
RT unsuccessful in obtaining ABG. Patient refused to let RT try again.

## 2020-03-31 NOTE — ED Provider Notes (Signed)
[facility] EMERGENCY DEPARTMENT Provider Note   CSN: [id number] Arrival date & time: 03/31/20  1620     History Chief Complaint  Patient presents with  . Altered Mental Status    Selena Clark is a 59 y.o. female.  Pt presents to the ED today with AMS and hypotension.  Pt was found unresponsive by her husband.  He said she laid down on the couch to take a nap.  He checked on her a few hrs later and she would not wake up. Per EMS, she was having agonal breathing and required bagging for 4 min and then she came around.  They initially put her on 15L NRB.   Her BP was in the 60s.  She was given IVFs and started on an epi drip by EMS at 4 mg/hr.  Pt is now awake and alert.  Pt said she does not know what happened.  She was admitted from 8/18-21 for syncope and resp failure from multiple PEs.  She did take her normal morning medicine.  She denies taking anything extra.          Past Medical History:  Diagnosis Date  . CAD (coronary artery disease)   . Coronary artery disease   . Depression   . Fibromyalgia   . Headache   . HTN (hypertension)   . Hx of CABG   . Hypercholesteremia   . Hypertension     Patient Active Problem List   Diagnosis Date Noted  . Acute respiratory failure with hypoxia (HCC) 03/22/2020  . Acute pulmonary embolism (HCC) 03/22/2020  . Elevated troponin 03/22/2020  . Laceration of head 03/22/2020  . Syncope and collapse 03/22/2020  . AKI (acute kidney injury) (HCC) 03/22/2020    Past Surgical History:  Procedure Laterality Date  . BACK SURGERY    . CARDIAC SURGERY    . CARPAL TUNNEL RELEASE    . CHOLECYSTECTOMY    . CORONARY ARTERY BYPASS GRAFT    . HERNIA REPAIR    . TUBAL LIGATION       OB History   No obstetric history on file.     Family History  Problem Relation Age of Onset  . Hypertension Mother     Social History   Tobacco Use  . Smoking status: Current Every Day Smoker    Packs/day: 1.00    Types:  Cigarettes  . Smokeless tobacco: Never Used  Substance Use Topics  . Alcohol use: No  . Drug use: No    Home Medications Prior to Admission medications   Medication Sig Start Date End Date Taking? Authorizing Provider  apixaban (ELIQUIS) 5 MG TABS tablet Take 2 tablets (10 mg total) by mouth 2 (two) times daily for 5 days. 03/25/20 03/30/20  Hughie Closs, MD  apixaban (ELIQUIS) 5 MG TABS tablet Take 1 tablet (5 mg total) by mouth 2 (two) times daily. 03/29/20 04/28/20  Hughie Closs, MD  atenolol (TENORMIN) 50 MG tablet Take 100 mg by mouth daily.    [provider]  atorvastatin (LIPITOR) 80 MG tablet Take 80 mg by mouth at bedtime.     [provider]  chlorpheniramine-HYDROcodone (TUSSIONEX PENNKINETIC ER) 10-8 MG/5ML SUER Take 5 mLs by mouth every 12 (twelve) hours as needed for cough. 12/02/16   Trixie Dredge, PA-C  clopidogrel (PLAVIX) 75 MG tablet Take 75 mg by mouth at bedtime.     [provider]  cyclobenzaprine (FLEXERIL) 10 MG tablet Take 30 mg by mouth  at bedtime.     [provider]  dicyclomine (BENTYL) 20 MG tablet Take 20 mg by mouth 3 (three) times daily. 02/20/20   [provider]  escitalopram (LEXAPRO) 10 MG tablet Take 20 mg by mouth at bedtime.     [provider]  escitalopram (LEXAPRO) 20 MG tablet Take 20 mg by mouth daily. 03/22/20   [provider]  gabapentin (NEURONTIN) 400 MG capsule Take 400 mg by mouth in the morning, at noon, and at bedtime. 02/29/20   [provider]  methylPREDNISolone (MEDROL DOSEPAK) 4 MG tablet 6po day 1, then decrease by 1 tab per day 04/18/13   Rolland Porter, MD  metoprolol tartrate (LOPRESSOR) 25 MG tablet Take 25 mg by mouth 2 (two) times daily. 03/16/20   [provider]  ondansetron (ZOFRAN) 4 MG tablet Take 1 tablet (4 mg total) by mouth every 8 (eight) hours as needed for nausea or vomiting. 12/02/16   Trixie Dredge, PA-C  oxyCODONE-acetaminophen (PERCOCET) 7.5-325  MG per tablet Take 1 tablet by mouth every 6 (six) hours as needed for pain.    [provider]  pantoprazole (PROTONIX) 20 MG tablet Take 20 mg by mouth 2 (two) times daily.    [provider]  pantoprazole (PROTONIX) 40 MG tablet Take 40 mg by mouth daily. 02/23/20   [provider]  pregabalin (LYRICA) 150 MG capsule Take 150 mg by mouth 3 (three) times daily.     [provider]  rosuvastatin (CRESTOR) 40 MG tablet Take 40 mg by mouth daily. 01/25/20   [provider]  tiZANidine (ZANAFLEX) 2 MG tablet Take 2 mg by mouth at bedtime.     [provider]  tiZANidine (ZANAFLEX) 4 MG tablet Take 4 mg by mouth 3 (three) times daily. 02/20/20   [provider]  topiramate (TOPAMAX) 100 MG tablet Take 100 mg by mouth at bedtime.  04/12/13   [provider]  topiramate (TOPAMAX) 100 MG tablet Take 100 mg by mouth daily. 03/07/20   [provider]  traMADol (ULTRAM-ER) 200 MG 24 hr tablet Take 200 mg by mouth daily as needed for pain.  04/12/13   [provider]  traZODone (DESYREL) 100 MG tablet Take 200 mg by mouth at bedtime as needed for sleep. 03/02/20   [provider]  traZODone (DESYREL) 150 MG tablet Take 150 mg by mouth at bedtime as needed for sleep. Takes either trazodone or zolpidem    [provider]  XTAMPZA ER 13.5 MG C12A Take 1 capsule by mouth 2 (two) times daily. 10/08/19   [provider]  XTAMPZA ER 18 MG C12A Take 1 capsule by mouth 2 (two) times daily. 03/20/20   [provider]  zolpidem (AMBIEN) 10 MG tablet Take 10 mg by mouth at bedtime as needed for sleep. Takes either zolpidem or trazodone    [provider]    Allergies    Albuterol and Aspirin  Review of Systems   Review of Systems  All other systems reviewed and are negative.   Physical Exam Updated Vital Signs BP 116/77   Pulse (!) 54   Temp 98.2 F (36.8 C) (Oral)   Resp (!) 22   SpO2  95%   Physical Exam Vitals and nursing note reviewed.  Constitutional:      Appearance: Normal appearance.  HENT:     Head: Normocephalic and atraumatic.     Comments: Oral thrush    Right Ear: External  ear normal.     Left Ear: External ear normal.     Nose: Nose normal.     Mouth/Throat:     Mouth: Mucous membranes are moist.     Pharynx: Oropharynx is clear.  Eyes:     Extraocular Movements: Extraocular movements intact.     Conjunctiva/sclera: Conjunctivae normal.     Pupils: Pupils are equal, round, and reactive to light.  Cardiovascular:     Rate and Rhythm: Normal rate and regular rhythm.     Pulses: Normal pulses.     Heart sounds: Normal heart sounds.  Pulmonary:     Effort: Pulmonary effort is normal.     Breath sounds: Normal breath sounds.  Abdominal:     General: Abdomen is flat. Bowel sounds are normal.     Palpations: Abdomen is soft.  Musculoskeletal:        General: Normal range of motion.     Cervical back: Normal range of motion and neck supple.  Skin:    General: Skin is warm.     Capillary Refill: Capillary refill takes less than 2 seconds.  Neurological:     General: No focal deficit present.     Mental Status: She is alert.  Psychiatric:        Mood and Affect: Mood normal.        Behavior: Behavior normal.     ED Results / Procedures / Treatments   Labs (all labs ordered are listed, but only abnormal results are displayed) Labs Reviewed  CBC WITH DIFFERENTIAL/PLATELET - Abnormal; Notable for the following components:      Result Value   MCV 101.8 (*)    RDW 17.1 (*)    All other components within normal limits  COMPREHENSIVE METABOLIC PANEL - Abnormal; Notable for the following components:   Potassium 2.9 (*)    Glucose, Bld 121 (*)    Calcium 8.4 (*)    Total Protein 5.0 (*)    Albumin 2.5 (*)    Total Bilirubin 0.1 (*)    All other components within normal limits  PROTIME-INR - Abnormal; Notable for the following components:    Prothrombin Time 15.8 (*)    INR 1.3 (*)    All other components within normal limits  URINALYSIS, ROUTINE W REFLEX MICROSCOPIC - Abnormal; Notable for the following components:   Specific Gravity, Urine 1.041 (*)    All other components within normal limits  RAPID URINE DRUG SCREEN, HOSP PERFORMED - Abnormal; Notable for the following components:   Opiates POSITIVE (*)    All other components within normal limits  AMMONIA - Abnormal; Notable for the following components:   Ammonia 41 (*)    All other components within normal limits  CBG MONITORING, ED - Abnormal; Notable for the following components:   Glucose-Capillary 102 (*)    All other components within normal limits  I-STAT ARTERIAL BLOOD GAS, ED - Abnormal; Notable for the following components:   pH, Arterial 7.267 (*)    pCO2 arterial 59.4 (*)    pO2, Arterial 64 (*)    All other components within normal limits  SARS CORONAVIRUS 2 BY RT PCR (HOSPITAL ORDER, PERFORMED IN [facility])  CULTURE, BLOOD (ROUTINE X 2)  CULTURE, BLOOD (ROUTINE X 2)  URINE CULTURE  LACTIC ACID, PLASMA  MAGNESIUM  TROPONIN I (HIGH SENSITIVITY)  TROPONIN I (HIGH SENSITIVITY)    EKG None   NSR  Hr 72.  No stemi.  No sig change.  Radiology DG Chest 1 View  Result Date: 03/31/2020 CLINICAL DATA:  Syncope. EXAM: CHEST  1 VIEW COMPARISON:  March 22, 2020. FINDINGS: The heart size and mediastinal contours are within normal limits. No pneumothorax or pleural effusion is noted. Right lung is clear. Mild left basilar atelectasis or scarring is noted. The visualized skeletal structures are unremarkable. IMPRESSION: Mild left basilar atelectasis or scarring. Electronically Signed   By: Lupita RaiderJames  Green Jr M.D.   On: 03/31/2020 16:46   CT Head Wo Contrast  Result Date: 03/31/2020 CLINICAL DATA:  Altered mental status and recent syncopal episode EXAM: CT HEAD WITHOUT CONTRAST TECHNIQUE: Contiguous axial images were obtained from the base of the  skull through the vertex without intravenous contrast. COMPARISON:  03/22/2020 FINDINGS: Brain: No evidence of acute infarction, hemorrhage, hydrocephalus, extra-axial collection or mass lesion/mass effect. Vascular: No hyperdense vessel or unexpected calcification. Skull: Normal. Negative for fracture or focal lesion. Sinuses/Orbits: No acute finding. Other: None. IMPRESSION: No acute intracranial abnormality noted. Electronically Signed   By: Alcide CleverMark  Lukens M.D.   On: 03/31/2020 19:21   CT Angio Chest PE W and/or Wo Contrast  Result Date: 03/31/2020 CLINICAL DATA:  Syncope. EXAM: CT ANGIOGRAPHY CHEST WITH CONTRAST TECHNIQUE: Multidetector CT imaging of the chest was performed using the standard protocol during bolus administration of intravenous contrast. Multiplanar CT image reconstructions and MIPs were obtained to evaluate the vascular anatomy. CONTRAST:  75mL OMNIPAQUE IOHEXOL 350 MG/ML SOLN COMPARISON:  None. FINDINGS: Cardiovascular: There is mild calcification of the aortic arch. A mild amount of artifact is seen involving the posterior lower lobe branches of the bilateral pulmonary arteries. There is no definite evidence of pulmonary embolism. Normal heart size. No pericardial effusion. Mediastinum/Nodes: No enlarged mediastinal, hilar, or axillary lymph nodes. Thyroid gland, trachea, and esophagus demonstrate no significant findings. Lungs/Pleura: Mild areas of atelectasis are seen along the posterior aspects of the bilateral lower lobes. There is no evidence of a pleural effusion or pneumothorax. Upper Abdomen: The gallbladder is absent. Musculoskeletal: No chest wall abnormality. No acute or significant osseous findings. Review of the MIP images confirms the above findings. IMPRESSION: 1. Areas of artifact involving the posterior lower lobe branches of the bilateral pulmonary arteries, without definite evidence of pulmonary embolism. 2. Mild bilateral lower lobe atelectasis. 3. Aortic atherosclerosis.  Aortic Atherosclerosis (ICD10-I70.0). Electronically Signed   By: Aram Candelahaddeus  Houston M.D.   On: 03/31/2020 18:58    Procedures Procedures (including critical care time)  Medications Ordered in ED Medications  sodium chloride 0.9 % bolus 1,000 mL (0 mLs Intravenous Stopped 03/31/20 1700)  sodium chloride 0.9 % bolus 1,000 mL (0 mLs Intravenous Stopped 03/31/20 1730)  sodium chloride 0.9 % bolus 1,000 mL (0 mLs Intravenous Stopped 03/31/20 1959)  iohexol (OMNIPAQUE) 350 MG/ML injection 75 mL (75 mLs Intravenous Contrast Given 03/31/20 1841)  sodium chloride 0.9 % bolus 1,000 mL (1,000 mLs Intravenous New Bag/Given 03/31/20 2004)  potassium chloride SA (KLOR-CON) CR tablet 40 mEq (40 mEq Oral Given 03/31/20 2003)    ED Course  I have reviewed the triage vital signs and the nursing notes.  Pertinent labs & imaging results that were available during my care of the patient were reviewed by me and considered in my medical decision making (see chart for details).    MDM Rules/Calculators/A&P                          BP has improved with IVFs.  No fever or  lactic acidosis.  Doubt sepsis. ? Maybe pt took too much of her medication?  Pt's mental status waxes and wanes. RT finally able to get an ABG.  Ph is low.  I ordered bipap.  Pt is sleepy, but will easily awaken and speak.  Covid negative.  Pt d/w Dr. Leafy Half (triad) for admission.  CRITICAL CARE Performed by: Jacalyn Lefevre   Total critical care time: 30 minutes  Critical care time was exclusive of separately billable procedures and treating other patients.  Critical care was necessary to treat or prevent imminent or life-threatening deterioration.  Critical care was time spent personally by me on the following activities: development of treatment plan with patient and/or surrogate as well as nursing, discussions with consultants, evaluation of patient's response to treatment, examination of patient, obtaining history from patient or  surrogate, ordering and performing treatments and interventions, ordering and review of laboratory studies, ordering and review of radiographic studies, pulse oximetry and re-evaluation of patient's condition.     Final Clinical Impression(s) / ED Diagnoses Final diagnoses:  Altered mental status, unspecified altered mental status type  Hypotension, unspecified hypotension type  Oral thrush  Hypokalemia    Rx / DC Orders ED Discharge Orders    None       Jacalyn Lefevre, MD 03/31/20 2117

## 2020-03-31 NOTE — ED Triage Notes (Signed)
Pt arrived by [facility]. Husband found pt unresponsive on couch with agonal breathing.  EMS reports bagging pt for and they placing her on 15L non-rebreather with initial O2 in 70s. On arrival O2 95% on RA.   Medics report Blood pressures 60/40s and started NS bolus and Epi Drip at 4mg /hr. Medics state that BP improved and then drop after epi was turned off.   Pt lethargic and oriented on arrival. No signs of trauma.

## 2020-04-01 ENCOUNTER — Other Ambulatory Visit: Payer: Self-pay

## 2020-04-01 LAB — COMPREHENSIVE METABOLIC PANEL
ALT: 18 U/L (ref 0–44)
AST: 21 U/L (ref 15–41)
Albumin: 2.6 g/dL — ABNORMAL LOW (ref 3.5–5.0)
Alkaline Phosphatase: 51 U/L (ref 38–126)
Anion gap: 7 (ref 5–15)
BUN: 6 mg/dL (ref 6–20)
CO2: 26 mmol/L (ref 22–32)
Calcium: 8.3 mg/dL — ABNORMAL LOW (ref 8.9–10.3)
Chloride: 108 mmol/L (ref 98–111)
Creatinine, Ser: 0.66 mg/dL (ref 0.44–1.00)
GFR calc Af Amer: >60 mL/min (ref >60)
GFR calc non Af Amer: >60 mL/min (ref >60)
Glucose, Bld: 91 mg/dL (ref 70–99)
Potassium: 3.4 mmol/L — ABNORMAL LOW (ref 3.5–5.1)
Sodium: 141 mmol/L (ref 135–145)
Total Bilirubin: 0.3 mg/dL (ref 0.3–1.2)
Total Protein: 5 g/dL — ABNORMAL LOW (ref 6.5–8.1)

## 2020-04-01 LAB — I-STAT VENOUS BLOOD GAS, ED
Acid-base deficit: 2 mmol/L (ref 0.0–2.0)
Bicarbonate: 25.8 mmol/L (ref 20.0–28.0)
Calcium, Ion: 1.26 mmol/L (ref 1.15–1.40)
HCT: 38 % (ref 36.0–46.0)
Hemoglobin: 12.9 g/dL (ref 12.0–15.0)
O2 Saturation: 89 %
Patient temperature: 98.6
Potassium: 3.7 mmol/L (ref 3.5–5.1)
Sodium: 144 mmol/L (ref 135–145)
TCO2: 27 mmol/L (ref 22–32)
pCO2, Ven: 55.3 mmHg (ref 44.0–60.0)
pH, Ven: 7.276 (ref 7.250–7.430)
pO2, Ven: 65 mmHg — ABNORMAL HIGH (ref 32.0–45.0)

## 2020-04-01 LAB — CBC WITH DIFFERENTIAL/PLATELET
Abs Immature Granulocytes: 0.01 10*3/uL (ref 0.00–0.07)
Basophils Absolute: 0 10*3/uL (ref 0.0–0.1)
Basophils Relative: 0 %
Eosinophils Absolute: 0.1 10*3/uL (ref 0.0–0.5)
Eosinophils Relative: 2 %
HCT: 42.7 % (ref 36.0–46.0)
Hemoglobin: 13 g/dL (ref 12.0–15.0)
Immature Granulocytes: 0 %
Lymphocytes Relative: 39 %
Lymphs Abs: 2.2 10*3/uL (ref 0.7–4.0)
MCH: 30.5 pg (ref 26.0–34.0)
MCHC: 30.4 g/dL (ref 30.0–36.0)
MCV: 100.2 fL — ABNORMAL HIGH (ref 80.0–100.0)
Monocytes Absolute: 0.5 10*3/uL (ref 0.1–1.0)
Monocytes Relative: 8 %
Neutro Abs: 2.9 10*3/uL (ref 1.7–7.7)
Neutrophils Relative %: 51 %
Platelets: 145 10*3/uL — ABNORMAL LOW (ref 150–400)
RBC: 4.26 MIL/uL (ref 3.87–5.11)
RDW: 17.1 % — ABNORMAL HIGH (ref 11.5–15.5)
WBC: 5.7 10*3/uL (ref 4.0–10.5)
nRBC: 0 % (ref 0.0–0.2)

## 2020-04-01 LAB — BLOOD GAS, ARTERIAL
Acid-Base Excess: 0.4 mmol/L (ref 0.0–2.0)
Bicarbonate: 25.3 mmol/L (ref 20.0–28.0)
Drawn by: 280991
FIO2: 36
O2 Saturation: 96.9 %
Patient temperature: 36.4
pCO2 arterial: 45.5 mmHg (ref 32.0–48.0)
pH, Arterial: 7.361 (ref 7.350–7.450)
pO2, Arterial: 88.4 mmHg (ref 83.0–108.0)

## 2020-04-01 LAB — MAGNESIUM: Magnesium: 1.9 mg/dL (ref 1.7–2.4)

## 2020-04-01 LAB — MRSA PCR SCREENING: MRSA by PCR: NEGATIVE

## 2020-04-01 MED ORDER — POTASSIUM CHLORIDE 10 MEQ/100ML IV SOLN
INTRAVENOUS | Status: AC
  Administered 2020-04-01: 10 meq via INTRAVENOUS
  Filled 2020-04-01: qty 100

## 2020-04-01 MED ORDER — POTASSIUM CHLORIDE 10 MEQ/100ML IV SOLN
10.0000 meq | Freq: Once | INTRAVENOUS | Status: AC
  Administered 2020-04-01: 10 meq via INTRAVENOUS
  Filled 2020-04-01: qty 100

## 2020-04-01 NOTE — ED Notes (Signed)
Pt found to be 83% on RA- placed on 3L, O2 97%. Pt tolerating well, will continue to monitor.

## 2020-04-01 NOTE — Plan of Care (Signed)
°  Problem: Activity: Goal: Risk for activity intolerance will decrease Outcome: Progressing   Problem: Nutrition: Goal: Adequate nutrition will be maintained Outcome: Progressing   Problem: Nutrition: Goal: Adequate nutrition will be maintained Outcome: Progressing   Problem: Elimination: Goal: Will not experience complications related to bowel motility Outcome: Progressing Goal: Will not experience complications related to urinary retention Outcome: Progressing   Problem: Pain Managment: Goal: General experience of comfort will improve Outcome: Progressing   Problem: Safety: Goal: Ability to remain free from injury will improve Outcome: Progressing   Problem: Safety: Goal: Ability to remain free from injury will improve Outcome: Progressing   Problem: Skin Integrity: Goal: Risk for impaired skin integrity will decrease Outcome: Progressing

## 2020-04-01 NOTE — Progress Notes (Signed)
Arterial blood gas obtained while patient wearing oxygen set at 3lpm.

## 2020-04-01 NOTE — Progress Notes (Signed)
PROGRESS NOTE    Selena Clark  IPJ:[id number] DOB: Feb 21, 1961 DOA: 03/31/2020 PCP: Regino Bellow, MD    Chief Complaint  Patient presents with   Altered Mental Status    Brief Narrative:  59 year old lady with prior history of coronary artery disease, s/p CABG, irritable bowel syndrome, hypertension, anxiety, chronic pain syndrome, hyperlipidemia, peripheral artery disease s/p stenting of the bilateral lower extremity, nicotine dependence, chronic diastolic heart failure with last ejection fraction showing preserved left ventricular ejection fraction of 60 to 65% presents to ED for altered mental status and was found to be unresponsive by her husband.  Patient was admitted for acute respiratory failure with hypoxia and hypercapnia CT angiogram of the chest rest revealed no evidence of pneumonia or pulmonary embolism.  Urine toxicology screen was positive for opiates.  ABG was performed revealed acute hypercarbic respiratory failure for which the patient was put on BiPAP and her mental status improved she was transitioned to 4 L of nasal cannula oxygen and currently she is alert awake oriented, answering all questions appropriately and following commands.  Assessment & Plan:   Principal Problem:   Acute hypercapnic respiratory failure (HCC) Active Problems:   Acute pulmonary embolism (HCC)   Acute metabolic encephalopathy   Hypokalemia   Coronary artery disease involving native heart without angina pectoris   Nicotine dependence, cigarettes, uncomplicated   Essential hypertension   Chronic pain syndrome   Acute respiratory failure with hypoxia and hypercapnia ABG on admission revealed respiratory acidosis with pH of 7.2 and PCO2 of 59.  The etiology probably secondary to overdose of high-dose opiates at home. CT of the chest reveals no evidence of acute cardiopulmonary disease. Patient required BiPAP overnight and transitioned to nasal cannula oxygen this  morning.      Acute metabolic encephalopathy Appears to have resolved.  Multifactorial probably secondary to a component of hypercapnic respiratory failure and encephalopathy.  Urine toxin screen is positive for opiates.  Ammonia is minimally elevated and urine analysis does not show any infection.    Recently diagnosed acute pulmonary embolism Continue with Eliquis.   History of coronary artery disease Continue with Plavix and Crestor Patient currently denies any chest pain or shortness of breath.    Essential hypertension Blood pressure parameters are borderline.     Chronic pain syndrome Unfortunately patient is on high-dose opiates which have been held for acute encephalopathy and hypercarbic respiratory failure and will be restarted at a lower dose once her symptoms improve.    DVT prophylaxis: ELIQUIS. Code Status: FULL CODE.  Family Communication: son at bedside.  Disposition:   Status is: Inpatient  Remains inpatient appropriate because:Ongoing diagnostic testing needed not appropriate for outpatient work up and Inpatient level of care appropriate due to severity of illness  Patient persistently hypoxic requiring up to 4 L of nasal cannula oxygen.  Will need PT OT evaluation and wean her off the oxygen prior to discharge.   Dispo: The patient is from: Home              Anticipated d/c is to: pending.               Anticipated d/c date is: 1 day              Patient currently is not medically stable to d/c.       Consultants:   None.    Procedures: none.    Antimicrobials: none.    Subjective: Patient reports feeling fine no chest pain  breathing has improved no nausea vomiting or abdominal pain,  Objective: Vitals:   04/01/20 0510 04/01/20 0814 04/01/20 0853 04/01/20 1312  BP: 113/80 (!) 165/92  137/82  Pulse: 74 74 75 77  Resp: 13 20 20 20   Temp:  (!) 97.4 F (36.3 C)  98.2 F (36.8 C)  TempSrc:      SpO2: 98% 94% 97% 97%  Weight:       Height:        Intake/Output Summary (Last 24 hours) at 04/01/2020 1644 Last data filed at 04/01/2020 0603 Gross per 24 hour  Intake 3030.52 ml  Output 300 ml  Net 2730.52 ml   Filed Weights   04/01/20 0214  Weight: 89 kg    Examination:  General exam: Appears calm and comfortable on 4 L of nasal cannula oxygen Respiratory system: Clear to auscultation. Respiratory effort normal. Cardiovascular system: S1 & S2 heard, RRR. No JVD,No pedal edema. Gastrointestinal system: Abdomen is nondistended, soft and nontender.  Normal bowel sounds heard. Central nervous system: Alert and oriented. No focal neurological deficits. Extremities: Symmetric 5 x 5 power. Skin: No rashes, lesions or ulcers Psychiatry:  Mood & affect appropriate.     Data Reviewed: I have personally reviewed following labs and imaging studies  CBC: Recent Labs  Lab 03/31/20 1627 03/31/20 2108 04/01/20 0059 04/01/20 0133  WBC 6.6  --   --  5.7  NEUTROABS 3.5  --   --  2.9  HGB 13.3 12.9 12.9 13.0  HCT 44.2 38.0 38.0 42.7  MCV 101.8*  --   --  100.2*  PLT 158  --   --  145*    Basic Metabolic Panel: Recent Labs  Lab 03/31/20 1627 03/31/20 2030 03/31/20 2108 04/01/20 0059 04/01/20 0133  NA 145  --  143 144 141  K 2.9*  --  3.8 3.7 3.4*  CL 109  --   --   --  108  CO2 27  --   --   --  26  GLUCOSE 121*  --   --   --  91  BUN 8  --   --   --  6  CREATININE 0.92  --   --   --  0.66  CALCIUM 8.4*  --   --   --  8.3*  MG  --  1.7  --   --  1.9    GFR: Estimated Creatinine Clearance: 80.5 mL/min (by C-G formula based on SCr of 0.66 mg/dL).  Liver Function Tests: Recent Labs  Lab 03/31/20 1627 04/01/20 0133  AST 22 21  ALT 17 18  ALKPHOS 49 51  BILITOT 0.1* 0.3  PROT 5.0* 5.0*  ALBUMIN 2.5* 2.6*    CBG: Recent Labs  Lab 03/31/20 1647  GLUCAP 102*     Recent Results (from the past 240 hour(s))  C Difficile Quick Screen w PCR reflex     Status: None   Collection Time: 03/25/20   8:05 AM   Specimen: STOOL  Result Value Ref Range Status   C Diff antigen NEGATIVE NEGATIVE Final   C Diff toxin NEGATIVE NEGATIVE Final   C Diff interpretation No C. difficile detected.  Final    Comment: Performed at [facility], [address] [place], [place] [address]  Culture, blood (routine x 2)     Status: None (Preliminary result)   Collection Time: 03/31/20  4:33 PM   Specimen: BLOOD  Result Value Ref Range Status  Specimen Description BLOOD RIGHT ANTECUBITAL  Final   Special Requests   Final    BOTTLES DRAWN AEROBIC AND ANAEROBIC Blood Culture adequate volume   Culture   Final    NO GROWTH < 24 HOURS Performed at [facility], [address], [place], [place] [address]    Report Status PENDING  Incomplete  Culture, blood (routine x 2)     Status: None (Preliminary result)   Collection Time: 03/31/20  4:50 PM   Specimen: BLOOD  Result Value Ref Range Status   Specimen Description BLOOD LEFT ANTECUBITAL  Final   Special Requests   Final    BOTTLES DRAWN AEROBIC AND ANAEROBIC Blood Culture results may not be optimal due to an excessive volume of blood received in culture bottles   Culture   Final    NO GROWTH < 24 HOURS Performed at [facility], [address], [place], [place] [address]    Report Status PENDING  Incomplete  SARS Coronavirus 2 by RT PCR (hospital order, performed in [facility] hospital lab) Nasopharyngeal Nasopharyngeal Swab     Status: None   Collection Time: 03/31/20  4:52 PM   Specimen: Nasopharyngeal Swab  Result Value Ref Range Status   SARS Coronavirus 2 NEGATIVE NEGATIVE Final    Comment: (NOTE) SARS-CoV-2 target nucleic acids are NOT DETECTED.  The SARS-CoV-2 RNA is generally detectable in upper and lower respiratory specimens during the acute phase of infection. The lowest concentration of SARS-CoV-2 viral copies this assay can detect is 250 copies / mL. A negative result does not preclude SARS-CoV-2 infection and  should not be used as the sole basis for treatment or other patient management decisions.  A negative result may occur with improper specimen collection / handling, submission of specimen other than nasopharyngeal swab, presence of viral mutation(s) within the areas targeted by this assay, and inadequate number of viral copies (<250 copies / mL). A negative result must be combined with clinical observations, patient history, and epidemiological information.  Fact Sheet for Patients:   BoilerBrush.com.cy  Fact Sheet for Healthcare Providers: https://pope.com/  This test is not yet approved or  cleared by the [place] FDA and has been authorized for detection and/or diagnosis of SARS-CoV-2 by FDA under an Emergency Use Authorization (EUA).  This EUA will remain in effect (meaning this test can be used) for the duration of the COVID-19 declaration under Section 564(b)(1) of the Act, 21 U.S.C. section 360bbb-3(b)(1), unless the authorization is terminated or revoked sooner.  Performed at [facility], [address] [place], [place] [address]   MRSA PCR Screening     Status: None   Collection Time: 04/01/20  5:38 AM   Specimen: Nasal Mucosa; Nasopharyngeal  Result Value Ref Range Status   MRSA by PCR NEGATIVE NEGATIVE Final    Comment:        The GeneXpert MRSA Assay (FDA approved for NASAL specimens only), is one component of a comprehensive MRSA colonization surveillance program. It is not intended to diagnose MRSA infection nor to guide or monitor treatment for MRSA infections. Performed at [facility], [address], [place], [place] [address]          Radiology Studies: DG Chest 1 View  Result Date: 03/31/2020 CLINICAL DATA:  Syncope. EXAM: CHEST  1 VIEW COMPARISON:  March 22, 2020. FINDINGS: The heart size and mediastinal contours are within normal limits. No pneumothorax or pleural effusion is  noted. Right  lung is clear. Mild left basilar atelectasis or scarring is noted. The visualized skeletal structures are unremarkable. IMPRESSION: Mild left basilar atelectasis or scarring. Electronically Signed   By: Lupita Raider M.D.   On: 03/31/2020 16:46   CT Head Wo Contrast  Result Date: 03/31/2020 CLINICAL DATA:  Altered mental status and recent syncopal episode EXAM: CT HEAD WITHOUT CONTRAST TECHNIQUE: Contiguous axial images were obtained from the base of the skull through the vertex without intravenous contrast. COMPARISON:  03/22/2020 FINDINGS: Brain: No evidence of acute infarction, hemorrhage, hydrocephalus, extra-axial collection or mass lesion/mass effect. Vascular: No hyperdense vessel or unexpected calcification. Skull: Normal. Negative for fracture or focal lesion. Sinuses/Orbits: No acute finding. Other: None. IMPRESSION: No acute intracranial abnormality noted. Electronically Signed   By: Alcide Clever M.D.   On: 03/31/2020 19:21   CT Angio Chest PE W and/or Wo Contrast  Result Date: 03/31/2020 CLINICAL DATA:  Syncope. EXAM: CT ANGIOGRAPHY CHEST WITH CONTRAST TECHNIQUE: Multidetector CT imaging of the chest was performed using the standard protocol during bolus administration of intravenous contrast. Multiplanar CT image reconstructions and MIPs were obtained to evaluate the vascular anatomy. CONTRAST:  32mL OMNIPAQUE IOHEXOL 350 MG/ML SOLN COMPARISON:  None. FINDINGS: Cardiovascular: There is mild calcification of the aortic arch. A mild amount of artifact is seen involving the posterior lower lobe branches of the bilateral pulmonary arteries. There is no definite evidence of pulmonary embolism. Normal heart size. No pericardial effusion. Mediastinum/Nodes: No enlarged mediastinal, hilar, or axillary lymph nodes. Thyroid gland, trachea, and esophagus demonstrate no significant findings. Lungs/Pleura: Mild areas of atelectasis are seen along the posterior aspects of the bilateral lower  lobes. There is no evidence of a pleural effusion or pneumothorax. Upper Abdomen: The gallbladder is absent. Musculoskeletal: No chest wall abnormality. No acute or significant osseous findings. Review of the MIP images confirms the above findings. IMPRESSION: 1. Areas of artifact involving the posterior lower lobe branches of the bilateral pulmonary arteries, without definite evidence of pulmonary embolism. 2. Mild bilateral lower lobe atelectasis. 3. Aortic atherosclerosis. Aortic Atherosclerosis (ICD10-I70.0). Electronically Signed   By: Aram Candela M.D.   On: 03/31/2020 18:58        Scheduled Meds:  apixaban  5 mg Oral BID   clopidogrel  75 mg Oral QHS   dicyclomine  20 mg Oral TID   escitalopram  20 mg Oral Daily   metoprolol tartrate  25 mg Oral BID   pantoprazole  40 mg Oral Daily   rosuvastatin  40 mg Oral Daily   Continuous Infusions:   LOS: 1 day        Kathlen Mody, MD [facility]   To contact the attending provider between 7A-7P or the covering provider during after hours 7P-7A, please log into the web site [email or web address] and access using universal  password for that web site. If you do not have the password, please call the hospital operator.  04/01/2020, 4:44 PM

## 2020-04-01 NOTE — Progress Notes (Signed)
Pt arrived to 2W21. Alert and oriented x 4, identified appropriately. VS stable no distress noted. Pt placed on Bipap by RT. Cardiac monitor and continuous pulse ox in place and CCMD notified. Pt oriented to room and equipment, no belongings at the bedside. Pt instructed to call for assistance, instructed how to use call bell, and call bell left with in reach.  Bed alarm in place. Will continue to monitor pt and treat per MD orders.

## 2020-04-01 NOTE — Progress Notes (Signed)
Pt awake and alert. Removed from BIPAP and placed on 4L [place]. Family at bedside. Will continue to monitor.

## 2020-04-02 LAB — BLOOD CULTURE ID PANEL (REFLEXED) - BCID2

## 2020-04-02 LAB — URINE CULTURE

## 2020-04-02 MED ORDER — HYDRALAZINE HCL 10 MG PO TABS
25.0000 mg | ORAL_TABLET | Freq: Three times a day (TID) | ORAL | 0 refills | Status: DC
Start: 2020-04-02 — End: 2022-06-14

## 2020-04-02 MED ORDER — OXYCODONE HCL 5 MG PO TABS: 5.0000 mg | ORAL_TABLET | Freq: Two times a day (BID) | ORAL | 0 refills | Status: AC | PRN

## 2020-04-02 MED ORDER — OXYCODONE HCL 5 MG PO TABS
5.0000 mg | ORAL_TABLET | Freq: Two times a day (BID) | ORAL | Status: DC | PRN
  Administered 2020-04-02: 5 mg via ORAL
  Filled 2020-04-02: qty 1

## 2020-04-02 MED ORDER — HYDRALAZINE HCL 10 MG PO TABS
10.0000 mg | ORAL_TABLET | Freq: Three times a day (TID) | ORAL | Status: DC
  Administered 2020-04-02: 10 mg via ORAL
  Filled 2020-04-02: qty 1

## 2020-04-02 MED ORDER — LABETALOL HCL 5 MG/ML IV SOLN
10.0000 mg | INTRAVENOUS | Status: DC | PRN
  Administered 2020-04-02: 10 mg via INTRAVENOUS
  Filled 2020-04-02 (×2): qty 4

## 2020-04-02 NOTE — Progress Notes (Signed)
Occupational Therapy Treatment Note  Educated pt on energy conservation and use of DMe and AE to increase independence and decrease fatigue with ADL tasks. Issued reacher and long handled sponge. Written material provided and reviewed. No further OT needs.    04/02/20 1400  OT Visit Information  Last OT Received On 04/02/20  Assistance Needed +1  History of Present Illness Pt is a 59 y.o. F with significant PMH of CAD s/p CABG, IBS, HTN, PAD, CHF, who presents after being unresponsive. Admitted with acute respiratory failure with hypoxia and hypercapnia. CT angiogram of chest revealed no evidence of pneumonia or PE. Urine toxicology screen positive for opiates.   Precautions  Precautions Fall  Pain Assessment  Pain Assessment No/denies pain  Cognition  Arousal/Alertness Awake/alert  Behavior During Therapy WFL for tasks assessed/performed  Overall Cognitive Status Within Functional Limits for tasks assessed  Upper Extremity Assessment  Upper Extremity Assessment Overall WFL for tasks assessed  Lower Extremity Assessment  Lower Extremity Assessment Defer to PT evaluation  ADL  General ADL Comments Educated pt on energy conservation by prioritizing activities, pacing herself adn using pursd lip breathing. written information reviewed. Educated on home set up adn how to simplify tasks to reduce fatigue. Pt verbalized understanding. Issued long handled sponge adn reacher to assist with ADL tasks.   Bed Mobility  Overal bed mobility Modified Independent  Balance  Standing balance-Leahy Scale Fair  Transfers  Overall transfer level Modified independent  Equipment used Rolling walker (2 wheeled)  Exercises  Exercises Other exercises  Other Exercises  Other Exercises pursed lip breathing  OT - End of Session  Activity Tolerance Patient tolerated treatment well  Patient left in bed;with call bell/phone within reach  Nurse Communication Mobility status  OT Assessment/Plan  OT Plan All  goals met and education completed, patient discharged from OT services  OT Visit Diagnosis Other (comment);Muscle weakness (generalized) (M62.81)  Follow Up Recommendations No OT follow up;Supervision - Intermittent  OT Equipment Tub/shower bench  AM-PAC OT "6 Clicks" Daily Activity Outcome Measure (Version 2)  Help from another person eating meals? 4  Help from another person taking care of personal grooming? 4  Help from another person toileting, which includes using toliet, bedpan, or urinal? 4  Help from another person bathing (including washing, rinsing, drying)? 4  Help from another person to put on and taking off regular upper body clothing? 4  Help from another person to put on and taking off regular lower body clothing? 4  6 Click Score 24  OT Goal Progression  Progress towards OT goals Goals met/education completed, patient discharged from OT  Acute Rehab OT Goals  Patient Stated Goal get increased [place] services i.e. aide  OT Goal Formulation With patient  Time For Goal Achievement 04/09/20  Potential to Achieve Goals Good  ADL Goals  Pt Will Perform Grooming standing;with modified independence  Pt Will Perform Upper Body Dressing with set-up  Pt Will Perform Lower Body Dressing with set-up  Pt Will Transfer to Toilet with supervision;ambulating  Pt Will Perform Toileting - Clothing Manipulation and hygiene with modified independence;sitting/lateral leans  Additional ADL Goal #1 Pt will verbalize 3 energy conservation strategies for ADL with no cues  OT Time Calculation  OT Start Time (ACUTE ONLY) 1411  OT Stop Time (ACUTE ONLY) 1422  OT Time Calculation (min) 11 min  OT General Charges  $OT Visit 1 Visit  OT Treatments  $Self Care/Home Management  8-22 mins  [facility], OT/L  Acute OT Clinical Specialist [place] Pager [phone] Office [phone]

## 2020-04-02 NOTE — Progress Notes (Signed)
PHARMACY - PHYSICIAN COMMUNICATION CRITICAL VALUE ALERT - BLOOD CULTURE IDENTIFICATION (BCID)  Selena Clark is an 59 y.o. female who presented to [facility] on 03/31/2020 with a chief complaint of AMS, likely opiate overdose  Assessment:   1/2 blood cultures growing Staphylococcus species, Pt is afebrile and without leukocytosis, likely contaminant  Name of physician (or Provider) Contacted:  Dr. Antionette Char  Current antibiotics: None  Changes to prescribed antibiotics recommended:  None at this time  Results for orders placed or performed during the hospital encounter of 03/31/20  Blood Culture ID Panel (Reflexed) (Collected: 03/31/2020  4:33 PM)  Result Value Ref Range   Enterococcus faecalis NOT DETECTED NOT DETECTED   Enterococcus Faecium NOT DETECTED NOT DETECTED   Listeria monocytogenes NOT DETECTED NOT DETECTED   Staphylococcus species DETECTED (A) NOT DETECTED   Staphylococcus aureus (BCID) NOT DETECTED NOT DETECTED   Staphylococcus epidermidis NOT DETECTED NOT DETECTED   Staphylococcus lugdunensis NOT DETECTED NOT DETECTED   Streptococcus species NOT DETECTED NOT DETECTED   Streptococcus agalactiae NOT DETECTED NOT DETECTED   Streptococcus pneumoniae NOT DETECTED NOT DETECTED   Streptococcus pyogenes NOT DETECTED NOT DETECTED   A.calcoaceticus-baumannii NOT DETECTED NOT DETECTED   Bacteroides fragilis NOT DETECTED NOT DETECTED   Enterobacterales NOT DETECTED NOT DETECTED   Enterobacter cloacae complex NOT DETECTED NOT DETECTED   Escherichia coli NOT DETECTED NOT DETECTED   Klebsiella aerogenes NOT DETECTED NOT DETECTED   Klebsiella oxytoca NOT DETECTED NOT DETECTED   Klebsiella pneumoniae NOT DETECTED NOT DETECTED   Proteus species NOT DETECTED NOT DETECTED   Salmonella species NOT DETECTED NOT DETECTED   Serratia marcescens NOT DETECTED NOT DETECTED   Haemophilus influenzae NOT DETECTED NOT DETECTED   Neisseria meningitidis NOT DETECTED NOT DETECTED   Pseudomonas  aeruginosa NOT DETECTED NOT DETECTED   Stenotrophomonas maltophilia NOT DETECTED NOT DETECTED   Candida albicans NOT DETECTED NOT DETECTED   Candida auris NOT DETECTED NOT DETECTED   Candida glabrata NOT DETECTED NOT DETECTED   Candida krusei NOT DETECTED NOT DETECTED   Candida parapsilosis NOT DETECTED NOT DETECTED   Candida tropicalis NOT DETECTED NOT DETECTED   Cryptococcus neoformans/gattii NOT DETECTED NOT DETECTED    Eddie Candle 04/02/2020  2:37 AM

## 2020-04-02 NOTE — Discharge Summary (Signed)
Physician Discharge Summary  Selena Clark JJK:[id number] DOB: 22-Aug-1960 DOA: 03/31/2020  PCP: Kateri Mc, MD  Admit date: 03/31/2020 Discharge date: 04/02/2020  Admitted From: Home.  Disposition:  HOME.   Recommendations for Outpatient Follow-up:  1. Follow up with PCP in 1-2 weeks 2. Please obtain BMP/CBC in one week Please follow up   Discharge Condition:Stable.  CODE STATUS:FULL CODE.  Diet recommendation: Heart Healthy  Brief/Interim Summary:  59 year old lady with prior history of coronary artery disease, s/p CABG, irritable bowel syndrome, hypertension, anxiety, chronic pain syndrome, hyperlipidemia, peripheral artery disease s/p stenting of the bilateral lower extremity, nicotine dependence, chronic diastolic heart failure with last ejection fraction showing preserved left ventricular ejection fraction of 60 to 65% presents to ED for altered mental status and was found to be unresponsive by her husband.  Patient was admitted for acute respiratory failure with hypoxia and hypercapnia CT angiogram of the chest rest revealed no evidence of pneumonia or pulmonary embolism.  Urine toxicology screen was positive for opiates.  ABG was performed revealed acute hypercarbic respiratory failure for which the patient was put on BiPAP and her mental status improved she was transitioned to 4 L of nasal cannula oxygen and currently she is alert awake oriented, answering all questions appropriately and following commands.  Discharge Diagnoses:  Principal Problem:   Acute hypercapnic respiratory failure (HCC) Active Problems:   Acute pulmonary embolism (HCC)   Acute metabolic encephalopathy   Hypokalemia   Coronary artery disease involving native heart without angina pectoris   Nicotine dependence, cigarettes, uncomplicated   Essential hypertension   Chronic pain syndrome   Acute respiratory failure with hypoxia and hypercapnia ABG on admission revealed respiratory acidosis with pH  of 7.2 and PCO2 of 59.  The etiology probably secondary to overdose of high-dose opiates at home. CT of the chest reveals no evidence of acute cardiopulmonary disease. Patient required BiPAP overnight and transitioned to nasal cannula oxygen, and then weaned off oxygen int he next 24 hours.       Acute metabolic encephalopathy Appears to have resolved.  Multifactorial probably secondary to a component of hypercapnic respiratory failure and encephalopathy.  Urine toxin screen is positive for opiates.  Ammonia is minimally elevated and urine analysis does not show any infection.    Recently diagnosed acute pulmonary embolism Continue with Eliquis.   History of coronary artery disease Continue with Plavix and Crestor Patient currently denies any chest pain or shortness of breath.    Essential hypertension Blood pressure parameters elevated, repeat pressures have improved and she is discharged on hydralazine 25 mg TID.      Chronic pain syndrome Unfortunately patient is on high-dose opiates which have been held for acute encephalopathy and hypercarbic respiratory failure , As she is alert and oriented at this time, will start low dose oxycodone 5 mg BID prn.     Discharge Instructions  Discharge Instructions    Diet - low sodium heart healthy   Complete by: As directed    Discharge instructions   Complete by: As directed    Please follow up with PCP tomorrow.   Increase activity slowly   Complete by: As directed      Allergies as of 04/02/2020      Reactions   Albuterol Nausea And Vomiting   Aspirin Hives      Medication List    STOP taking these medications   clopidogrel 75 MG tablet Commonly known as: PLAVIX   Xtampza ER 13.5 MG C12a  Generic drug: oxyCODONE ER     TAKE these medications   acetaminophen 500 MG tablet Commonly known as: TYLENOL Take 500 mg by mouth in the morning and at bedtime.   apixaban 5 MG Tabs tablet Commonly  known as: ELIQUIS Take 1 tablet (5 mg total) by mouth 2 (two) times daily.   dicyclomine 20 MG tablet Commonly known as: BENTYL Take 20 mg by mouth 3 (three) times daily.   escitalopram 20 MG tablet Commonly known as: LEXAPRO Take 20 mg by mouth daily.   gabapentin 400 MG capsule Commonly known as: NEURONTIN Take 400 mg by mouth in the morning, at noon, and at bedtime.   hydrALAZINE 10 MG tablet Commonly known as: APRESOLINE Take 2.5 tablets (25 mg total) by mouth 3 (three) times daily.   metoprolol tartrate 25 MG tablet Commonly known as: LOPRESSOR Take 25 mg by mouth 2 (two) times daily.   oxyCODONE 5 MG immediate release tablet Commonly known as: Oxy IR/ROXICODONE Take 1 tablet (5 mg total) by mouth every 12 (twelve) hours as needed for up to 3 days for moderate pain.   pantoprazole 40 MG tablet Commonly known as: PROTONIX Take 40 mg by mouth daily.   rosuvastatin 40 MG tablet Commonly known as: CRESTOR Take 40 mg by mouth daily.   tiZANidine 4 MG tablet Commonly known as: ZANAFLEX Take 4 mg by mouth 3 (three) times daily.   topiramate 100 MG tablet Commonly known as: TOPAMAX Take 100 mg by mouth daily.   traZODone 100 MG tablet Commonly known as: DESYREL Take 200 mg by mouth at bedtime as needed for sleep.       Follow-up Information    Kateri Mc, MD Follow up in 1 day(s).   Specialty: Family Medicine Contact information: [address] [place] [address] [phone]        Lars Pinks, MD .   Specialty: Cardiology Contact information: [place] [address] [phone]              Allergies  Allergen Reactions  . Albuterol Nausea And Vomiting  . Aspirin Hives    Consultations:  None.    Procedures/Studies: DG Chest 1 View  Result Date: 03/31/2020 CLINICAL DATA:  Syncope. EXAM: CHEST  1 VIEW COMPARISON:  March 22, 2020. FINDINGS: The heart size and mediastinal contours are within  normal limits. No pneumothorax or pleural effusion is noted. Right lung is clear. Mild left basilar atelectasis or scarring is noted. The visualized skeletal structures are unremarkable. IMPRESSION: Mild left basilar atelectasis or scarring. Electronically Signed   By: Marijo Conception M.D.   On: 03/31/2020 16:46   CT Head Wo Contrast  Result Date: 03/31/2020 CLINICAL DATA:  Altered mental status and recent syncopal episode EXAM: CT HEAD WITHOUT CONTRAST TECHNIQUE: Contiguous axial images were obtained from the base of the skull through the vertex without intravenous contrast. COMPARISON:  03/22/2020 FINDINGS: Brain: No evidence of acute infarction, hemorrhage, hydrocephalus, extra-axial collection or mass lesion/mass effect. Vascular: No hyperdense vessel or unexpected calcification. Skull: Normal. Negative for fracture or focal lesion. Sinuses/Orbits: No acute finding. Other: None. IMPRESSION: No acute intracranial abnormality noted. Electronically Signed   By: Inez Catalina M.D.   On: 03/31/2020 19:21   CT HEAD WO CONTRAST  Result Date: 03/22/2020 CLINICAL DATA:  Syncope EXAM: CT HEAD WITHOUT CONTRAST TECHNIQUE: Contiguous axial images were obtained from the base of the skull through the vertex without intravenous contrast. COMPARISON:  None. FINDINGS:  Brain: No evidence of acute infarction, hemorrhage, hydrocephalus, extra-axial collection or mass lesion/mass effect. Vascular: No hyperdense vessel or unexpected calcification. Skull: No osseous abnormality. Sinuses/Orbits: Visualized paranasal sinuses are clear. Visualized mastoid sinuses are clear. Visualized orbits demonstrate no focal abnormality. Other: Left parietal scalp laceration with hematoma. IMPRESSION: 1. No acute intracranial pathology. 2. Left parietal scalp laceration with hematoma. Electronically Signed   By: Kathreen Devoid   On: 03/22/2020 15:48   CT Angio Chest PE W and/or Wo Contrast  Result Date: 03/31/2020 CLINICAL DATA:  Syncope.  EXAM: CT ANGIOGRAPHY CHEST WITH CONTRAST TECHNIQUE: Multidetector CT imaging of the chest was performed using the standard protocol during bolus administration of intravenous contrast. Multiplanar CT image reconstructions and MIPs were obtained to evaluate the vascular anatomy. CONTRAST:  30m OMNIPAQUE IOHEXOL 350 MG/ML SOLN COMPARISON:  None. FINDINGS: Cardiovascular: There is mild calcification of the aortic arch. A mild amount of artifact is seen involving the posterior lower lobe branches of the bilateral pulmonary arteries. There is no definite evidence of pulmonary embolism. Normal heart size. No pericardial effusion. Mediastinum/Nodes: No enlarged mediastinal, hilar, or axillary lymph nodes. Thyroid gland, trachea, and esophagus demonstrate no significant findings. Lungs/Pleura: Mild areas of atelectasis are seen along the posterior aspects of the bilateral lower lobes. There is no evidence of a pleural effusion or pneumothorax. Upper Abdomen: The gallbladder is absent. Musculoskeletal: No chest wall abnormality. No acute or significant osseous findings. Review of the MIP images confirms the above findings. IMPRESSION: 1. Areas of artifact involving the posterior lower lobe branches of the bilateral pulmonary arteries, without definite evidence of pulmonary embolism. 2. Mild bilateral lower lobe atelectasis. 3. Aortic atherosclerosis. Aortic Atherosclerosis (ICD10-I70.0). Electronically Signed   By: TVirgina NorfolkM.D.   On: 03/31/2020 18:58   CT Angio Chest PE W and/or Wo Contrast  Result Date: 03/22/2020 CLINICAL DATA:  59year old female with syncope. Concern for pulmonary embolism. EXAM: CT ANGIOGRAPHY CHEST WITH CONTRAST TECHNIQUE: Multidetector CT imaging of the chest was performed using the standard protocol during bolus administration of intravenous contrast. Multiplanar CT image reconstructions and MIPs were obtained to evaluate the vascular anatomy. CONTRAST:  744mOMNIPAQUE IOHEXOL 350  MG/ML SOLN COMPARISON:  Chest CT dated 08/26/2006. FINDINGS: Cardiovascular: There is no cardiomegaly. Small pericardial effusion. There is mild atherosclerotic calcification of the thoracic aorta. Evaluation of the pulmonary arteries is somewhat limited due to respiratory motion artifact. Small right lower lobe segmental or subsegmental nonocclusive pulmonary artery emboli noted, age indeterminate. No large or central pulmonary artery embolus. No CT evidence of right heart straining. Mediastinum/Nodes: There is no hilar or mediastinal adenopathy. The esophagus and the thyroid gland are grossly unremarkable. No mediastinal fluid collection. Lungs/Pleura: There are bibasilar atelectasis. No focal consolidation, pleural effusion, or pneumothorax. The central airways are patent. Upper Abdomen: Probable fatty liver. Musculoskeletal: No chest wall abnormality. No acute or significant osseous findings. Review of the MIP images confirms the above findings. IMPRESSION: 1. Small nodule seen right lower lobe segmental or subsegmental pulmonary artery emboli, age indeterminate. No large or central pulmonary artery embolus. 2. Small pericardial effusion. 3. Aortic Atherosclerosis (ICD10-I70.0). These results were called by telephone at the time of interpretation on 03/22/2020 at 4:15 pm to [facility] who verbally acknowledged these results. Electronically Signed   By: ArAnner Crete.D.   On: 03/22/2020 16:16   CT CERVICAL SPINE WO CONTRAST  Result Date: 03/22/2020 CLINICAL DATA:  Neck trauma, uncomplicated. Fall, trauma, neck pain. EXAM: CT CERVICAL SPINE WITHOUT  CONTRAST TECHNIQUE: Multidetector CT imaging of the cervical spine was performed without intravenous contrast. Multiplanar CT image reconstructions were also generated. COMPARISON:  No pertinent prior exams are available for comparison. FINDINGS: Alignment: Cervical levocurvature reversal of the expected cervical lordosis. No significant  spondylolisthesis. Skull base and vertebrae: The basion-dental and atlanto-dental intervals are maintained.No evidence of acute fracture to the cervical spine. There are multiple subcentimeter lucent osseous lesions within the cervical spine, for instance within the C3 and C5 vertebral bodies on image 33 of series 13, within the C2 left articular pillar on image 45 of series 13, and within the C7 right transverse process on series 13, image 22. Soft tissues and spinal canal: No prevertebral fluid or swelling. No visible canal hematoma. Disc levels: Cervical spondylosis. Most notably at C4-C5 there is moderate/advanced disc space narrowing with a bulky posterior disc osteophyte complex which contributes to at least moderate spinal canal stenosis. Upper chest: No consolidation within the imaged lung apices. No visible pneumothorax IMPRESSION: No evidence of acute fracture to the cervical spine. There are several subcentimeter lucent osseous lesions within the cervical spine. These foci are nonspecific and could certainly be a benign. However, osseous metastases cannot be excluded and clinical correlation for any known primary malignancy is recommended. Additionally, multiple myeloma could have a similar imaging appearance and correlation with relevant laboratory values is recommended. Cervical levocurvature and nonspecific reversal of the expected cervical lordosis. At C4-C5, a bulky posterior disc osteophyte contributes to at least moderate spinal canal stenosis, likely with some degree of spinal cord mass effect. Electronically Signed   By: Kellie Simmering DO   On: 03/22/2020 15:44   DG Pelvis Portable  Result Date: 03/22/2020 CLINICAL DATA:  Syncope with fall. EXAM: PORTABLE PELVIS 1-2 VIEWS COMPARISON:  Sacral x-rays dated September 10, 2006. FINDINGS: There is no evidence of pelvic fracture or diastasis. No pelvic bone lesions are seen. Prior L3-L5 fusion. Kissing iliac stents noted. Surgical clips in the right  groin. IMPRESSION: 1. Negative. Electronically Signed   By: Titus Dubin M.D.   On: 03/22/2020 14:56   DG Chest Port 1 View  Result Date: 03/22/2020 CLINICAL DATA:  Syncope with fall. EXAM: PORTABLE CHEST 1 VIEW COMPARISON:  Chest x-ray dated Dec 10, 2019. FINDINGS: The heart size and mediastinal contours are within normal limits. Mild scarring and atelectasis at the peripheral left lung base. No focal consolidation, pleural effusion, or pneumothorax. No acute osseous abnormality. IMPRESSION: 1. No active disease. Electronically Signed   By: Titus Dubin M.D.   On: 03/22/2020 14:54   EEG adult  Result Date: 03/23/2020 Lora Havens, MD     03/23/2020 10:19 AM Patient Name: Selena Clark MRN: [id number] Epilepsy Attending: Lora Havens Referring Physician/Provider: Dr Darliss Cheney Date: 03/22/2020  Duration: 28.10 mins Patient history: 59yo F with syncope and ams. EEG to evaluate for seizure. Level of alertness: Awake AEDs during EEG study: GBP, TPM Technical aspects: This EEG study was done with scalp electrodes positioned according to the 10-20 International system of electrode placement. Electrical activity was acquired at a sampling rate of _0  and reviewed with a high frequency filter of _1  and a low frequency filter of _2 . EEG data were recorded continuously and digitally stored. Description: No posterior dominant rhythm was seen. EEG showed continuous generalized 5-8 Hz theta-alpha activity as well as 2-_3  delta slowing was noted. Hyperventilation and photic stimulation were not performed.  Bilateral twitching of upper extremities and eyelid twitching was intermittently noted during  study. Concomitant eeg before, during and after eeg didn't show any eeg change to suggest seizure.  ABNORMALITY -Continuous slow, generalized IMPRESSION: This study is suggestive of mild to moderate diffuse encephalopathy, nonspecific etiology. No seizures or epileptiform discharges were seen throughout  the recording. Lora Havens   ECHOCARDIOGRAM COMPLETE  Result Date: 03/23/2020    ECHOCARDIOGRAM REPORT   Patient Name:   Selena Clark Date of Exam: 03/22/2020 Medical Rec #:  [id number]        Height:       62.0 in Accession #:    [id number]       Weight:       175.0 lb Date of Birth:  [birth year]        BSA:          1.806 m Patient Age:    87 years         BP:           141/77 mmHg Patient Gender: F                HR:           104 bpm. Exam Location:  Inpatient Procedure: 2D Echo Indications:    elevated troponin  History:        Patient has no prior history of Echocardiogram examinations.                 Pulmonary embolism; Signs/Symptoms:Syncope.  Sonographer:    Johny Chess Referring Phys: [id number] RAVI Lawtell  1. Left ventricular ejection fraction, by estimation, is 60 to 65%. The left ventricle has normal function. The left ventricle has no regional wall motion abnormalities. Left ventricular diastolic parameters are consistent with Grade I diastolic dysfunction (impaired relaxation).  2. Right ventricular systolic function is normal. The right ventricular size is normal.  3. The mitral valve is normal in structure. No evidence of mitral valve regurgitation. No evidence of mitral stenosis.  4. The aortic valve is tricuspid. Aortic valve regurgitation is not visualized. No aortic stenosis is present.  5. The inferior vena cava is normal in size with greater than 50% respiratory variability, suggesting right atrial pressure of 3 mmHg. FINDINGS  Left Ventricle: Left ventricular ejection fraction, by estimation, is 60 to 65%. The left ventricle has normal function. The left ventricle has no regional wall motion abnormalities. The left ventricular internal cavity size was normal in size. There is  no left ventricular hypertrophy. Left ventricular diastolic parameters are consistent with Grade I diastolic dysfunction (impaired relaxation). Right Ventricle: The right ventricular size is  normal.Right ventricular systolic function is normal. Left Atrium: Left atrial size was normal in size. Right Atrium: Right atrial size was normal in size. Pericardium: There is no evidence of pericardial effusion. Mitral Valve: The mitral valve is normal in structure. Normal mobility of the mitral valve leaflets. No evidence of mitral valve regurgitation. No evidence of mitral valve stenosis. Tricuspid Valve: The tricuspid valve is normal in structure. Tricuspid valve regurgitation is trivial. No evidence of tricuspid stenosis. Aortic Valve: The aortic valve is tricuspid. Aortic valve regurgitation is not visualized. No aortic stenosis is present. Pulmonic Valve: The pulmonic valve was normal in structure. Pulmonic valve regurgitation is not visualized. No evidence of pulmonic stenosis. Aorta: The aortic root is normal in size and structure. Venous: The inferior vena cava is normal in size with greater than 50% respiratory variability, suggesting right atrial pressure of 3 mmHg. IAS/Shunts: No atrial level shunt detected by color  flow Doppler.  LEFT VENTRICLE PLAX 2D LVIDd:         4.40 cm Diastology LVIDs:         2.70 cm LV e' medial:   10.10 cm/s LV PW:         1.00 cm LV E/e' medial: 8.2 LV IVS:        0.80 cm  RIGHT VENTRICLE RV S prime:     12.70 cm/s LEFT ATRIUM             Index       RIGHT ATRIUM           Index LA diam:        3.00 cm 1.66 cm/m  RA Area:     10.20 cm LA Vol (A2C):   46.0 ml 25.47 ml/m RA Volume:   20.50 ml  11.35 ml/m LA Vol (A4C):   32.8 ml 18.16 ml/m LA Biplane Vol: 39.8 ml 22.04 ml/m  AORTIC VALVE LVOT Vmax:   124.00 cm/s LVOT Vmean:  79.600 cm/s LVOT VTI:    0.201 m  AORTA Ao Asc diam: 2.80 cm MV E velocity: 82.70 cm/s MV A velocity: 114.00 cm/s  SHUNTS MV E/A ratio:  0.73         Systemic VTI: 0.20 m Kirk Ruths MD Electronically signed by Kirk Ruths MD Signature Date/Time: 03/23/2020/3:06:17 PM    Final    VAS [place] LOWER EXTREMITY VENOUS (DVT)  Result Date: 03/23/2020   Lower Venous DVTStudy Indications: Pulmonary embolism.  Comparison Study: no prior Performing Technologist: Abram Sander RVS  Examination Guidelines: A complete evaluation includes B-mode imaging, spectral Doppler, color Doppler, and power Doppler as needed of all accessible portions of each vessel. Bilateral testing is considered an integral part of a complete examination. Limited examinations for reoccurring indications may be performed as noted. The reflux portion of the exam is performed with the patient in reverse Trendelenburg.  +---------+---------------+---------+-----------+----------+--------------+ RIGHT    CompressibilityPhasicitySpontaneityPropertiesThrombus Aging +---------+---------------+---------+-----------+----------+--------------+ CFV      Full           Yes      Yes                                 +---------+---------------+---------+-----------+----------+--------------+ SFJ      Full                                                        +---------+---------------+---------+-----------+----------+--------------+ FV Prox  Full                                                        +---------+---------------+---------+-----------+----------+--------------+ FV Mid   Full                                                        +---------+---------------+---------+-----------+----------+--------------+ FV DistalFull                                                        +---------+---------------+---------+-----------+----------+--------------+  PFV      Full                                                        +---------+---------------+---------+-----------+----------+--------------+ POP      Full           Yes      Yes                                 +---------+---------------+---------+-----------+----------+--------------+ PTV      Full                                                         +---------+---------------+---------+-----------+----------+--------------+ PERO     Full                                                        +---------+---------------+---------+-----------+----------+--------------+   +---------+---------------+---------+-----------+----------+--------------+ LEFT     CompressibilityPhasicitySpontaneityPropertiesThrombus Aging +---------+---------------+---------+-----------+----------+--------------+ CFV      Full           Yes      Yes                                 +---------+---------------+---------+-----------+----------+--------------+ SFJ      Full                                                        +---------+---------------+---------+-----------+----------+--------------+ FV Prox  Full                                                        +---------+---------------+---------+-----------+----------+--------------+ FV Mid   Full                                                        +---------+---------------+---------+-----------+----------+--------------+ FV DistalFull                                                        +---------+---------------+---------+-----------+----------+--------------+ PFV      Full                                                        +---------+---------------+---------+-----------+----------+--------------+  POP      Full           Yes      Yes                                 +---------+---------------+---------+-----------+----------+--------------+ PTV      Full                                                        +---------+---------------+---------+-----------+----------+--------------+ PERO     Full                                                        +---------+---------------+---------+-----------+----------+--------------+     Summary: BILATERAL: - No evidence of deep vein thrombosis seen in the lower extremities, bilaterally. - No evidence of  superficial venous thrombosis in the lower extremities, bilaterally. -   *See table(s) above for measurements and observations. Electronically signed by Servando Snare MD on 03/23/2020 at 2:48:17 PM.    Final        Subjective: No new complaints.   Discharge Exam: Vitals:   04/02/20 1210 04/02/20 1400  BP: (!) 183/97 (!) 160/86  Pulse: 78 81  Resp:    Temp:    SpO2: 96% 95%   Vitals:   04/02/20 0750 04/02/20 1137 04/02/20 1210 04/02/20 1400  BP: (!) 130/93 (!) 188/97 (!) 183/97 (!) 160/86  Pulse: 81 69 78 81  Resp: 18 16    Temp: 98.8 F (37.1 C) 98.6 F (37 C)    TempSrc:      SpO2: 95% 100% 96% 95%  Weight:      Height:        General: Pt is alert, awake, not in acute distress Cardiovascular: RRR, S1/S2 +, no rubs, no gallops Respiratory: CTA bilaterally, no wheezing, no rhonchi Abdominal: Soft, NT, ND, bowel sounds + Extremities: no edema, no cyanosis    The results of significant diagnostics from this hospitalization (including imaging, microbiology, ancillary and laboratory) are listed below for reference.     Microbiology: Recent Results (from the past 240 hour(s))  C Difficile Quick Screen w PCR reflex     Status: None   Collection Time: 03/25/20  8:05 AM   Specimen: STOOL  Result Value Ref Range Status   C Diff antigen NEGATIVE NEGATIVE Final   C Diff toxin NEGATIVE NEGATIVE Final   C Diff interpretation No C. difficile detected.  Final    Comment: Performed at [facility], [place] [address] [place], [place] [address]  Culture, blood (routine x 2)     Status: None (Preliminary result)   Collection Time: 03/31/20  4:33 PM   Specimen: BLOOD  Result Value Ref Range Status   Specimen Description BLOOD RIGHT ANTECUBITAL  Final   Special Requests   Final    BOTTLES DRAWN AEROBIC AND ANAEROBIC Blood Culture adequate volume   Culture  Setup Time   Final    GRAM POSITIVE COCCI IN CLUSTERS AEROBIC BOTTLE ONLY CRITICAL RESULT CALLED TO, READ BACK BY AND  VERIFIED WITH: G. ABBOTT,PHARMD 0151 04/02/2020 T. TYSOR  Performed at [facility], [place] [address], [place], [place] [address]    Culture GRAM POSITIVE COCCI  Final   Report Status PENDING  Incomplete  Blood Culture ID Panel (Reflexed)     Status: Abnormal   Collection Time: 03/31/20  4:33 PM  Result Value Ref Range Status   Enterococcus faecalis NOT DETECTED NOT DETECTED Final   Enterococcus Faecium NOT DETECTED NOT DETECTED Final   Listeria monocytogenes NOT DETECTED NOT DETECTED Final   Staphylococcus species DETECTED (A) NOT DETECTED Final    Comment: CRITICAL RESULT CALLED TO, READ BACK BY AND VERIFIED WITH: G. ABBOTT,PHARMD 0151 04/02/2020 T. TYSOR    Staphylococcus aureus (BCID) NOT DETECTED NOT DETECTED Final   Staphylococcus epidermidis NOT DETECTED NOT DETECTED Final   Staphylococcus lugdunensis NOT DETECTED NOT DETECTED Final   Streptococcus species NOT DETECTED NOT DETECTED Final   Streptococcus agalactiae NOT DETECTED NOT DETECTED Final   Streptococcus pneumoniae NOT DETECTED NOT DETECTED Final   Streptococcus pyogenes NOT DETECTED NOT DETECTED Final   A.calcoaceticus-baumannii NOT DETECTED NOT DETECTED Final   Bacteroides fragilis NOT DETECTED NOT DETECTED Final   Enterobacterales NOT DETECTED NOT DETECTED Final   Enterobacter cloacae complex NOT DETECTED NOT DETECTED Final   Escherichia coli NOT DETECTED NOT DETECTED Final   Klebsiella aerogenes NOT DETECTED NOT DETECTED Final   Klebsiella oxytoca NOT DETECTED NOT DETECTED Final   Klebsiella pneumoniae NOT DETECTED NOT DETECTED Final   Proteus species NOT DETECTED NOT DETECTED Final   Salmonella species NOT DETECTED NOT DETECTED Final   Serratia marcescens NOT DETECTED NOT DETECTED Final   Haemophilus influenzae NOT DETECTED NOT DETECTED Final   Neisseria meningitidis NOT DETECTED NOT DETECTED Final   Pseudomonas aeruginosa NOT DETECTED NOT DETECTED Final   Stenotrophomonas maltophilia NOT DETECTED NOT  DETECTED Final   Candida albicans NOT DETECTED NOT DETECTED Final   Candida auris NOT DETECTED NOT DETECTED Final   Candida glabrata NOT DETECTED NOT DETECTED Final   Candida krusei NOT DETECTED NOT DETECTED Final   Candida parapsilosis NOT DETECTED NOT DETECTED Final   Candida tropicalis NOT DETECTED NOT DETECTED Final   Cryptococcus neoformans/gattii NOT DETECTED NOT DETECTED Final    Comment: Performed at [facility], [address] [place], [place] [address]  Culture, blood (routine x 2)     Status: None (Preliminary result)   Collection Time: 03/31/20  4:50 PM   Specimen: BLOOD  Result Value Ref Range Status   Specimen Description BLOOD LEFT ANTECUBITAL  Final   Special Requests   Final    BOTTLES DRAWN AEROBIC AND ANAEROBIC Blood Culture results may not be optimal due to an excessive volume of blood received in culture bottles   Culture   Final    NO GROWTH < 24 HOURS Performed at [facility], [place] [address] [place], [place] [address]    Report Status PENDING  Incomplete  SARS Coronavirus 2 by RT PCR (hospital order, performed in [facility] hospital lab) Nasopharyngeal Nasopharyngeal Swab     Status: None   Collection Time: 03/31/20  4:52 PM   Specimen: Nasopharyngeal Swab  Result Value Ref Range Status   SARS Coronavirus 2 NEGATIVE NEGATIVE Final    Comment: (NOTE) SARS-CoV-2 target nucleic acids are NOT DETECTED.  The SARS-CoV-2 RNA is generally detectable in upper and lower respiratory specimens during the acute phase of infection. The lowest concentration of SARS-CoV-2 viral copies this assay can detect is 250 copies / mL. A negative  result does not preclude SARS-CoV-2 infection and should not be used as the sole basis for treatment or other patient management decisions.  A negative result may occur with improper specimen collection / handling, submission of specimen other than nasopharyngeal swab, presence of viral mutation(s) within the areas targeted  by this assay, and inadequate number of viral copies (<250 copies / mL). A negative result must be combined with clinical observations, patient history, and epidemiological information.  Fact Sheet for Patients:   StrictlyIdeas.no  Fact Sheet for Healthcare Providers: BankingDealers.co.za  This test is not yet approved or  cleared by the [place] FDA and has been authorized for detection and/or diagnosis of SARS-CoV-2 by FDA under an Emergency Use Authorization (EUA).  This EUA will remain in effect (meaning this test can be used) for the duration of the COVID-19 declaration under Section 564(b)(1) of the Act, 21 U.S.C. section 360bbb-3(b)(1), unless the authorization is terminated or revoked sooner.  Performed at [facility], [place] [address] [place], [place] [address]   Urine culture     Status: Abnormal   Collection Time: 03/31/20  7:48 PM   Specimen: Urine, Random  Result Value Ref Range Status   Specimen Description URINE, RANDOM  Final   Special Requests   Final    NONE Performed at [facility], [place] [address] [place], [place] [address]    Culture MULTIPLE SPECIES PRESENT, SUGGEST RECOLLECTION (A)  Final   Report Status 04/02/2020 FINAL  Final  MRSA PCR Screening     Status: None   Collection Time: 04/01/20  5:38 AM   Specimen: Nasal Mucosa; Nasopharyngeal  Result Value Ref Range Status   MRSA by PCR NEGATIVE NEGATIVE Final    Comment:        The GeneXpert MRSA Assay (FDA approved for NASAL specimens only), is one component of a comprehensive MRSA colonization surveillance program. It is not intended to diagnose MRSA infection nor to guide or monitor treatment for MRSA infections. Performed at [facility], [place] [address] [place], [place] [address]      Labs: BNP (last 3 results) No results for input(s): BNP in the last 8760 hours. Basic Metabolic Panel: Recent Labs  Lab  03/31/20 1627 03/31/20 2030 03/31/20 2108 04/01/20 0059 04/01/20 0133  NA 145  --  143 144 141  K 2.9*  --  3.8 3.7 3.4*  CL 109  --   --   --  108  CO2 27  --   --   --  26  GLUCOSE 121*  --   --   --  91  BUN 8  --   --   --  6  CREATININE 0.92  --   --   --  0.66  CALCIUM 8.4*  --   --   --  8.3*  MG  --  1.7  --   --  1.9   Liver Function Tests: Recent Labs  Lab 03/31/20 1627 04/01/20 0133  AST 22 21  ALT 17 18  ALKPHOS 49 51  BILITOT 0.1* 0.3  PROT 5.0* 5.0*  ALBUMIN 2.5* 2.6*   No results for input(s): LIPASE, AMYLASE in the last 168 hours. Recent Labs  Lab 03/31/20 2030  AMMONIA 41*   CBC: Recent Labs  Lab 03/31/20 1627 03/31/20 2108 04/01/20 0059 04/01/20 0133  WBC 6.6  --   --  5.7  NEUTROABS 3.5  --   --  2.9  HGB  13.3 12.9 12.9 13.0  HCT 44.2 38.0 38.0 42.7  MCV 101.8*  --   --  100.2*  PLT 158  --   --  145*   Cardiac Enzymes: No results for input(s): CKTOTAL, CKMB, CKMBINDEX, TROPONINI in the last 168 hours. BNP: Invalid input(s): POCBNP CBG: Recent Labs  Lab 03/31/20 1647  GLUCAP 102*   D-Dimer No results for input(s): DDIMER in the last 72 hours. Hgb A1c No results for input(s): HGBA1C in the last 72 hours. Lipid Profile No results for input(s): CHOL, HDL, LDLCALC, TRIG, CHOLHDL, LDLDIRECT in the last 72 hours. Thyroid function studies No results for input(s): TSH, T4TOTAL, T3FREE, THYROIDAB in the last 72 hours.  Invalid input(s): FREET3 Anemia work up No results for input(s): VITAMINB12, FOLATE, FERRITIN, TIBC, IRON, RETICCTPCT in the last 72 hours. Urinalysis    Component Value Date/Time   COLORURINE YELLOW 03/31/2020 1944   APPEARANCEUR CLEAR 03/31/2020 1944   LABSPEC 1.041 (H) 03/31/2020 1944   PHURINE 5.0 03/31/2020 1944   GLUCOSEU NEGATIVE 03/31/2020 1944   HGBUR NEGATIVE 03/31/2020 1944   BILIRUBINUR NEGATIVE 03/31/2020 1944   KETONESUR NEGATIVE 03/31/2020 1944   PROTEINUR NEGATIVE 03/31/2020 1944   NITRITE  NEGATIVE 03/31/2020 1944   LEUKOCYTESUR NEGATIVE 03/31/2020 1944   Sepsis Labs Invalid input(s): PROCALCITONIN,  WBC,  LACTICIDVEN Microbiology Recent Results (from the past 240 hour(s))  C Difficile Quick Screen w PCR reflex     Status: None   Collection Time: 03/25/20  8:05 AM   Specimen: STOOL  Result Value Ref Range Status   C Diff antigen NEGATIVE NEGATIVE Final   C Diff toxin NEGATIVE NEGATIVE Final   C Diff interpretation No C. difficile detected.  Final    Comment: Performed at [facility], [place] [address], [place], [place] [address]  Culture, blood (routine x 2)     Status: None (Preliminary result)   Collection Time: 03/31/20  4:33 PM   Specimen: BLOOD  Result Value Ref Range Status   Specimen Description BLOOD RIGHT ANTECUBITAL  Final   Special Requests   Final    BOTTLES DRAWN AEROBIC AND ANAEROBIC Blood Culture adequate volume   Culture  Setup Time   Final    GRAM POSITIVE COCCI IN CLUSTERS AEROBIC BOTTLE ONLY CRITICAL RESULT CALLED TO, READ BACK BY AND VERIFIED WITH: G. ABBOTT,PHARMD 0151 04/02/2020 Mena Goes Performed at [facility], [place] [address] [place], [place] [address]    Culture GRAM POSITIVE COCCI  Final   Report Status PENDING  Incomplete  Blood Culture ID Panel (Reflexed)     Status: Abnormal   Collection Time: 03/31/20  4:33 PM  Result Value Ref Range Status   Enterococcus faecalis NOT DETECTED NOT DETECTED Final   Enterococcus Faecium NOT DETECTED NOT DETECTED Final   Listeria monocytogenes NOT DETECTED NOT DETECTED Final   Staphylococcus species DETECTED (A) NOT DETECTED Final    Comment: CRITICAL RESULT CALLED TO, READ BACK BY AND VERIFIED WITH: G. ABBOTT,PHARMD 0151 04/02/2020 T. TYSOR    Staphylococcus aureus (BCID) NOT DETECTED NOT DETECTED Final   Staphylococcus epidermidis NOT DETECTED NOT DETECTED Final   Staphylococcus lugdunensis NOT DETECTED NOT DETECTED Final   Streptococcus species NOT DETECTED NOT DETECTED Final    Streptococcus agalactiae NOT DETECTED NOT DETECTED Final   Streptococcus pneumoniae NOT DETECTED NOT DETECTED Final   Streptococcus pyogenes NOT DETECTED NOT DETECTED Final   A.calcoaceticus-baumannii NOT DETECTED NOT DETECTED Final   Bacteroides fragilis NOT DETECTED NOT DETECTED  Final   Enterobacterales NOT DETECTED NOT DETECTED Final   Enterobacter cloacae complex NOT DETECTED NOT DETECTED Final   Escherichia coli NOT DETECTED NOT DETECTED Final   Klebsiella aerogenes NOT DETECTED NOT DETECTED Final   Klebsiella oxytoca NOT DETECTED NOT DETECTED Final   Klebsiella pneumoniae NOT DETECTED NOT DETECTED Final   Proteus species NOT DETECTED NOT DETECTED Final   Salmonella species NOT DETECTED NOT DETECTED Final   Serratia marcescens NOT DETECTED NOT DETECTED Final   Haemophilus influenzae NOT DETECTED NOT DETECTED Final   Neisseria meningitidis NOT DETECTED NOT DETECTED Final   Pseudomonas aeruginosa NOT DETECTED NOT DETECTED Final   Stenotrophomonas maltophilia NOT DETECTED NOT DETECTED Final   Candida albicans NOT DETECTED NOT DETECTED Final   Candida auris NOT DETECTED NOT DETECTED Final   Candida glabrata NOT DETECTED NOT DETECTED Final   Candida krusei NOT DETECTED NOT DETECTED Final   Candida parapsilosis NOT DETECTED NOT DETECTED Final   Candida tropicalis NOT DETECTED NOT DETECTED Final   Cryptococcus neoformans/gattii NOT DETECTED NOT DETECTED Final    Comment: Performed at [facility], [place] [address] [place], [place] [address]  Culture, blood (routine x 2)     Status: None (Preliminary result)   Collection Time: 03/31/20  4:50 PM   Specimen: BLOOD  Result Value Ref Range Status   Specimen Description BLOOD LEFT ANTECUBITAL  Final   Special Requests   Final    BOTTLES DRAWN AEROBIC AND ANAEROBIC Blood Culture results may not be optimal due to an excessive volume of blood received in culture bottles   Culture   Final    NO GROWTH < 24 HOURS Performed at [facility], [place] [address] [place], [place] [address]    Report Status PENDING  Incomplete  SARS Coronavirus 2 by RT PCR (hospital order, performed in [facility] hospital lab) Nasopharyngeal Nasopharyngeal Swab     Status: None   Collection Time: 03/31/20  4:52 PM   Specimen: Nasopharyngeal Swab  Result Value Ref Range Status   SARS Coronavirus 2 NEGATIVE NEGATIVE Final    Comment: (NOTE) SARS-CoV-2 target nucleic acids are NOT DETECTED.  The SARS-CoV-2 RNA is generally detectable in upper and lower respiratory specimens during the acute phase of infection. The lowest concentration of SARS-CoV-2 viral copies this assay can detect is 250 copies / mL. A negative result does not preclude SARS-CoV-2 infection and should not be used as the sole basis for treatment or other patient management decisions.  A negative result may occur with improper specimen collection / handling, submission of specimen other than nasopharyngeal swab, presence of viral mutation(s) within the areas targeted by this assay, and inadequate number of viral copies (<250 copies / mL). A negative result must be combined with clinical observations, patient history, and epidemiological information.  Fact Sheet for Patients:   StrictlyIdeas.no  Fact Sheet for Healthcare Providers: BankingDealers.co.za  This test is not yet approved or  cleared by the [place] FDA and has been authorized for detection and/or diagnosis of SARS-CoV-2 by FDA under an Emergency Use Authorization (EUA).  This EUA will remain in effect (meaning this test can be used) for the duration of the COVID-19 declaration under Section 564(b)(1) of the Act, 21 U.S.C. section 360bbb-3(b)(1), unless the authorization is terminated or revoked sooner.  Performed at [facility], [place] [address] [place], [place] [address]   Urine culture     Status: Abnormal   Collection Time: 03/31/20  7:48 PM    Specimen: Urine, Random  Result Value Ref Range Status   Specimen Description URINE, RANDOM  Final   Special Requests   Final    NONE Performed at [facility], [address] [place], [place] [address]    Culture MULTIPLE SPECIES PRESENT, SUGGEST RECOLLECTION (A)  Final   Report Status 04/02/2020 FINAL  Final  MRSA PCR Screening     Status: None   Collection Time: 04/01/20  5:38 AM   Specimen: Nasal Mucosa; Nasopharyngeal  Result Value Ref Range Status   MRSA by PCR NEGATIVE NEGATIVE Final    Comment:        The GeneXpert MRSA Assay (FDA approved for NASAL specimens only), is one component of a comprehensive MRSA colonization surveillance program. It is not intended to diagnose MRSA infection nor to guide or monitor treatment for MRSA infections. Performed at [facility], [place] [address] [place], [place] [address]      Time coordinating discharge: 36 minutes.   SIGNED:   Hosie Poisson, MD  [facility] 04/02/2020, 2:55 PM

## 2020-04-02 NOTE — Progress Notes (Signed)
Occupational therapy Evaluation Pt states she has difficulty with bathing and dressing. Has had 2 recent falls. Educated pt on need to use tub bench rather than shower seat. Will return to complete education on energy conservation and educate on use of AE to help with ADL tasks.   04/02/20 1300  OT Visit Information  Last OT Received On 04/02/20  Assistance Needed +1  History of Present Illness Pt is a 59 y.o. F with significant PMH of CAD s/p CABG, IBS, HTN, PAD, CHF, who presents after being unresponsive. Admitted with acute respiratory failure with hypoxia and hypercapnia. CT angiogram of chest revealed no evidence of pneumonia or PE. Urine toxicology screen positive for opiates.   Precautions  Precautions Fall  Home Living  Family/patient expects to be discharged to: Private residence  Living Arrangements Spouse/significant other  Available Help at Discharge Family;Available 24 hours/day  Type of Home House  Home Access Stairs to enter  Entrance Stairs-Number of Steps 1  Home Layout One level  Bathroom Nurse, children's No  Home Economist - 2 wheels;Cane - single point;Walker - 4 wheels  Prior Function  Level of Independence Needs assistance  Gait / Transfers Assistance Needed using walker  ADL's / Homemaking Assistance Needed has been requiring assist with ADL's from spouse since returning home  Communication  Communication No difficulties  Pain Assessment  Pain Assessment No/denies pain (however just recieved pain medsq)  Cognition  Arousal/Alertness Awake/alert  Behavior During Therapy WFL for tasks assessed/performed  Overall Cognitive Status Within Functional Limits for tasks assessed  Upper Extremity Assessment  Upper Extremity Assessment Overall WFL for tasks assessed  Lower Extremity Assessment  Lower Extremity Assessment Defer to PT evaluation  Cervical / Trunk Assessment  Cervical /  Trunk Assessment Normal  ADL  Functional mobility during ADLs Modified independent  General ADL Comments States bathing adn dressing is difficult. Able to comlete figure four position. Encouraged pt to use this technique rather than bending over to complete dressing and bathing. States she is scared she will fall when stepping over tub. recommend tub bench  Bed Mobility  Overal bed mobility Modified Independent  Transfers  Overall transfer level Modified independent  Equipment used Rolling walker (2 wheeled)  Balance  Overall balance assessment Needs assistance  Sitting-balance support Feet supported  Sitting balance-Leahy Scale Good  Standing balance support Bilateral upper extremity supported;Single extremity supported;During functional activity  Standing balance-Leahy Scale Poor  OT - End of Session  Activity Tolerance Patient tolerated treatment well  Patient left in bed;with call bell/phone within reach  Nurse Communication Mobility status  OT Assessment  OT Recommendation/Assessment Patient needs continued OT Services  OT Visit Diagnosis Other (comment) (cardiopulmonary deficits)  OT Problem List Decreased knowledge of use of DME or AE;Decreased activity tolerance  OT Plan  OT Frequency (ACUTE ONLY) Min 1X/week  OT Treatment/Interventions (ACUTE ONLY) Self-care/ADL training;Energy conservation;DME and/or AE instruction;Therapeutic activities;Patient/family education  AM-PAC OT "6 Clicks" Daily Activity Outcome Measure (Version 2)  Help from another person eating meals? 4  Help from another person taking care of personal grooming? 4  Help from another person toileting, which includes using toliet, bedpan, or urinal? 3  Help from another person bathing (including washing, rinsing, drying)? 3  Help from another person to put on and taking off regular upper body clothing? 3  Help from another person to put on and taking off regular lower body clothing? 3  6 Click Score 20  OT  Recommendation  Follow Up Recommendations No OT follow up;Supervision - Intermittent  OT Equipment Tub/shower bench (pt to get on her own)  Individuals Consulted  Consulted and Agree with Results and Recommendations Patient  Acute Rehab OT Goals  Patient Stated Goal get increased HH services i.e. aide  OT Goal Formulation With patient  Time For Goal Achievement 04/09/20  Potential to Achieve Goals Good  OT Time Calculation  OT Start Time (ACUTE ONLY) 1310  OT Stop Time (ACUTE ONLY) 1328  OT Time Calculation (min) 18 min  OT General Charges  $OT Visit 1 Visit  OT Evaluation  $OT Eval Low Complexity 1 Low  Written Expression  Dominant Hand Right  Luisa Dago, OT/L   Acute OT Clinical Specialist Acute Rehabilitation Services Pager [phone] Office [phone]

## 2020-04-02 NOTE — Evaluation (Signed)
Physical Therapy Evaluation Patient Details Name: Selena Clark MRN: [id number] DOB: [birth year] Today's Date: 04/02/2020   History of Present Illness  Pt is a 59 y.o. F with significant PMH of CAD s/p CABG, IBS, HTN, PAD, CHF, who presents after being unresponsive. Admitted with acute respiratory failure with hypoxia and hypercapnia. CT angiogram of chest revealed no evidence of pneumonia or PE. Urine toxicology screen positive for opiates.   Clinical Impression  Patient evaluated by Physical Therapy with no further acute PT needs identified. Pt A&Ox3, does not recall events leading to hospitalization. Ambulating 100 feet with a walker at a supervision level, SpO2 97% on RA, HR stable 81 bpm. Education provided regarding endurance conservation strategies and activity recommendations. All education has been completed and the patient has no further questions. No follow-up Physical Therapy or equipment needs. PT is signing off. Thank you for this referral.     Follow Up Recommendations No PT follow up;Supervision for mobility/OOB    Equipment Recommendations  None recommended by PT    Recommendations for Other Services       Precautions / Restrictions Precautions Precautions: None Restrictions Weight Bearing Restrictions: No      Mobility  Bed Mobility Overal bed mobility: Modified Independent                Transfers Overall transfer level: Modified independent Equipment used: Rolling walker (2 wheeled)                Ambulation/Gait Ambulation/Gait assistance: Supervision Gait Distance (Feet): 100 Feet Assistive device: Rolling walker (2 wheeled) Gait Pattern/deviations: Step-through pattern;Decreased stride length Gait velocity: decreased   General Gait Details: Slow and steady pace, no gross imbalance, cues for activity pacing  Stairs            Wheelchair Mobility    Modified Rankin (Stroke Patients Only)       Balance Overall balance  assessment: Needs assistance Sitting-balance support: Feet supported Sitting balance-Leahy Scale: Good     Standing balance support: Bilateral upper extremity supported;Single extremity supported;During functional activity Standing balance-Leahy Scale: Poor                               Pertinent Vitals/Pain Pain Assessment: No/denies pain    Home Living Family/patient expects to be discharged to:: Private residence Living Arrangements: Spouse/significant other Available Help at Discharge: Family;Available 24 hours/day Type of Home: House Home Access: Stairs to enter   [employer] of Steps: 1 Home Layout: One level Home Equipment: Emergency planning/management officer - 2 wheels;Cane - single point      Prior Function Level of Independence: Needs assistance   Gait / Transfers Assistance Needed: using walker  ADL's / Homemaking Assistance Needed: has been requiring assist with ADL's from spouse since returning home        Hand Dominance        Extremity/Trunk Assessment   Upper Extremity Assessment Upper Extremity Assessment: Overall WFL for tasks assessed    Lower Extremity Assessment Lower Extremity Assessment: Overall WFL for tasks assessed       Communication   Communication: No difficulties  Cognition Arousal/Alertness: Awake/alert Behavior During Therapy: WFL for tasks assessed/performed Overall Cognitive Status: Within Functional Limits for tasks assessed                                 General Comments: A&Ox3  General Comments      Exercises     Assessment/Plan    PT Assessment Patent does not need any further PT services  PT Problem List         PT Treatment Interventions      PT Goals (Current goals can be found in the Care Plan section)  Acute Rehab PT Goals Patient Stated Goal: get increased HH services i.e. aide PT Goal Formulation: All assessment and education complete, DC therapy    Frequency      Barriers to discharge        Co-evaluation               AM-PAC PT "6 Clicks" Mobility  Outcome Measure Help needed turning from your back to your side while in a flat bed without using bedrails?: None Help needed moving from lying on your back to sitting on the side of a flat bed without using bedrails?: None Help needed moving to and from a bed to a chair (including a wheelchair)?: None Help needed standing up from a chair using your arms (e.g., wheelchair or bedside chair)?: None Help needed to walk in hospital room?: None Help needed climbing 3-5 steps with a railing? : A Little 6 Click Score: 23    End of Session   Activity Tolerance: Patient tolerated treatment well Patient left: in chair;with call bell/phone within reach Nurse Communication: Mobility status PT Visit Diagnosis: Unsteadiness on feet (R26.81);Difficulty in walking, not elsewhere classified (R26.2)    Time: 1043-1100 PT Time Calculation (min) (ACUTE ONLY): 17 min   Charges:   PT Evaluation $PT Eval Moderate Complexity: 1 Mod            Selena Clark, PT, DPT Acute Rehabilitation Services Pager [phone] Office [phone]   Norval Morton 04/02/2020, 1:07 PM

## 2020-04-03 LAB — CULTURE, BLOOD (ROUTINE X 2): Special Requests: ADEQUATE

## 2020-04-05 LAB — CULTURE, BLOOD (ROUTINE X 2): Culture: NO GROWTH

## 2021-06-05 IMAGING — CT CT ANGIO CHEST
3 of 15 series · 13 of 38 positions shown · IV contrast (omnipaque)
Comparison: None.

CLINICAL DATA: Syncope.

EXAM:
CT ANGIOGRAPHY CHEST WITH CONTRAST
TECHNIQUE: Multidetector CT imaging of the chest was performed using the
standard protocol during bolus administration of intravenous
contrast. Multiplanar CT image reconstructions and MIPs were
obtained to evaluate the vascular anatomy.
CONTRAST:  75mL OMNIPAQUE IOHEXOL 350 MG/ML SOLN

[Series 9: lung · axial · 0.77mm/px · z∈[+1070,+1146]mm · 2 of 116 slices shown]
[im 39/116  mediastinal]
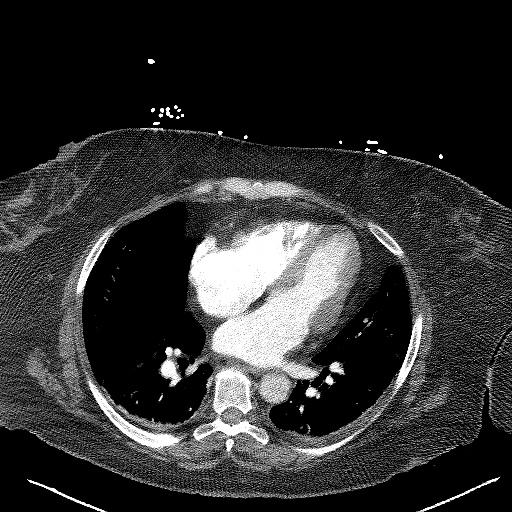
[im 77/116  mediastinal]
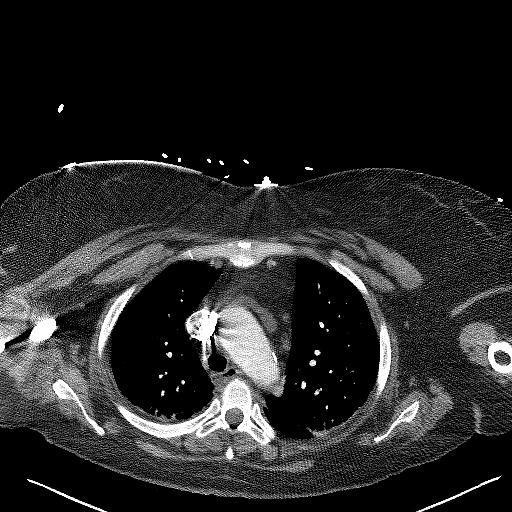

[Series 10: thins · axial · 0.77mm/px · z∈[+1016,+1201]mm · 9 of 331 slices shown (1 of 2)]
[im 34/331  lung]
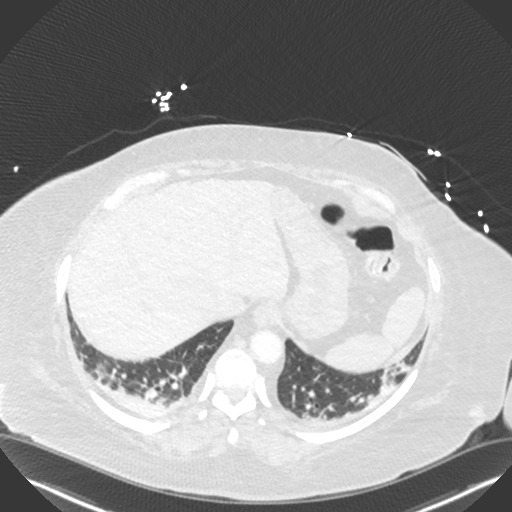
[im 67/331  mediastinal]
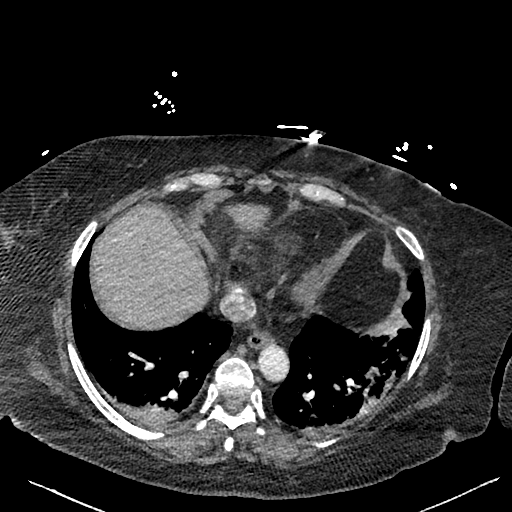
[im 100/331  lung]
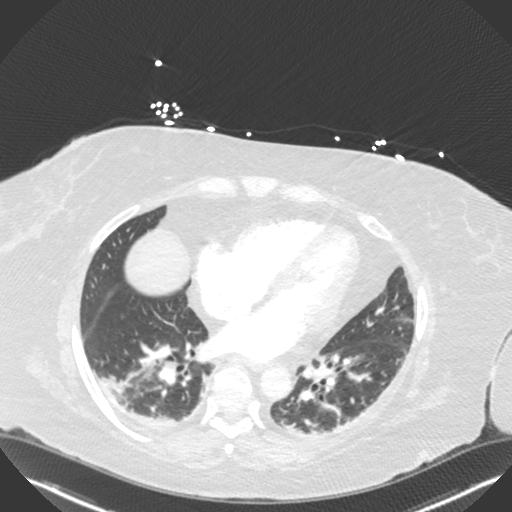
[im 133/331  mediastinal]
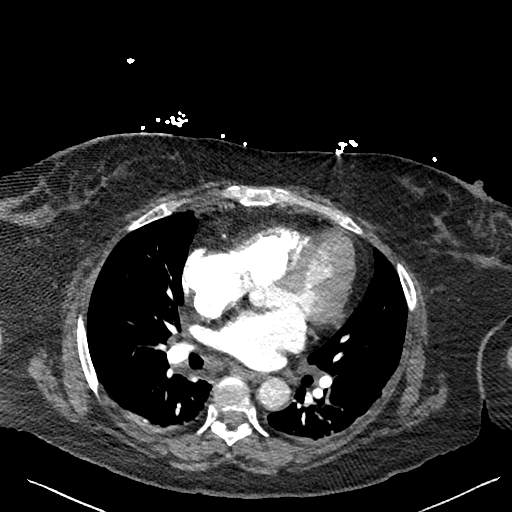
[im 166/331  lung]
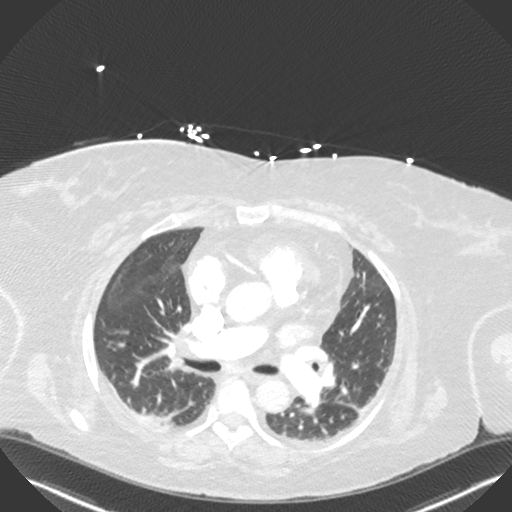
[im 199/331  mediastinal]
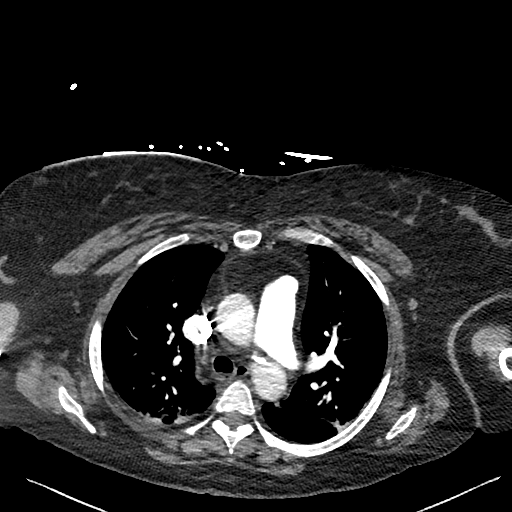
[im 232/331  lung]
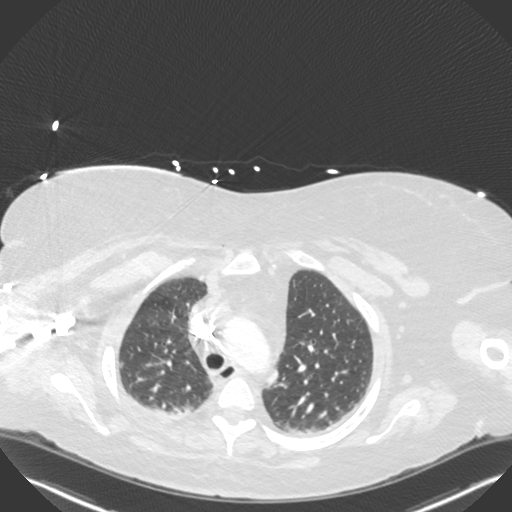
[im 265/331  mediastinal]
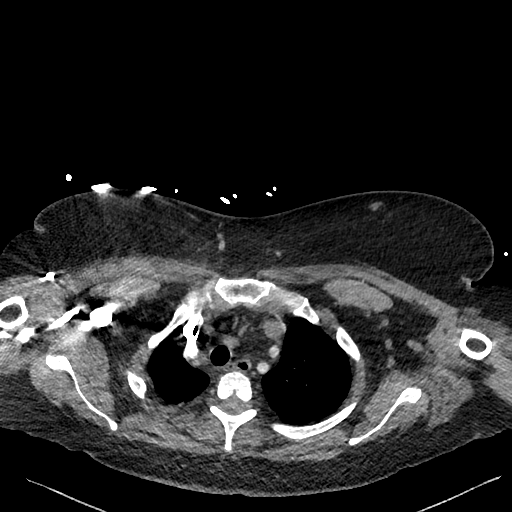
[im 298/331  lung]
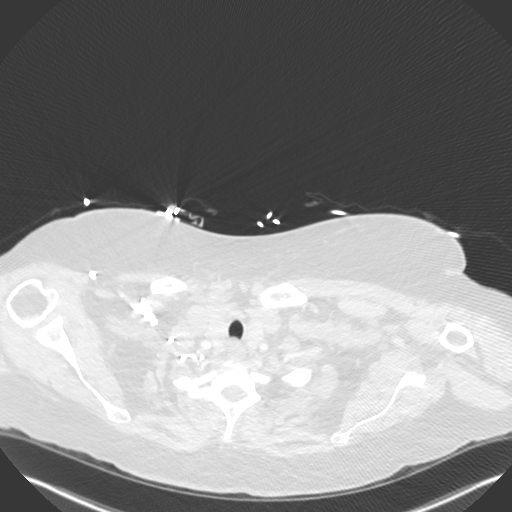

[Series 17: thins · axial · 0.77mm/px · z∈[+1007,+1033]mm · 2 of 115 slices shown (2 of 2)]
[im 39/115  lung]
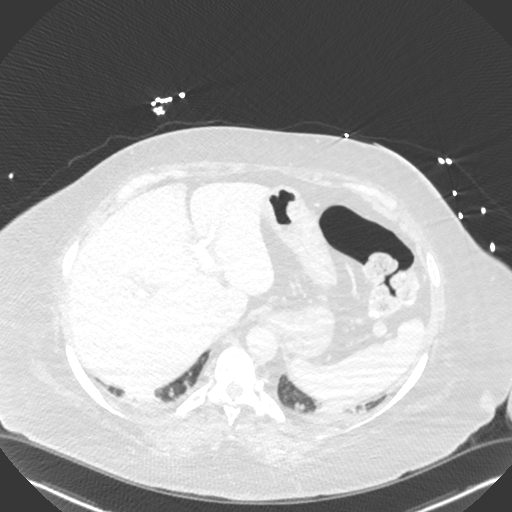
[im 77/115  lung]
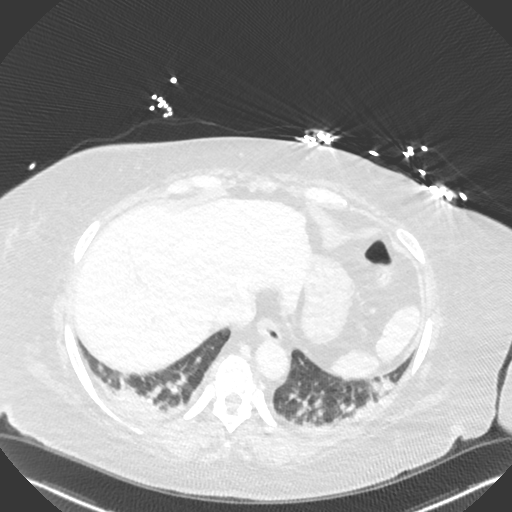

[13 of 38 positions shown; findings below may reference images not displayed]

FINDINGS: Cardiovascular: There is mild calcification of the aortic arch. A
mild amount of artifact is seen involving the posterior lower lobe
branches of the bilateral pulmonary arteries. There is no definite
evidence of pulmonary embolism. Normal heart size. No pericardial
effusion.

Mediastinum/Nodes: No enlarged mediastinal, hilar, or axillary lymph
nodes. Thyroid gland, trachea, and esophagus demonstrate no
significant findings.

Lungs/Pleura: Mild areas of atelectasis are seen along the posterior
aspects of the bilateral lower lobes.

There is no evidence of a pleural effusion or pneumothorax.

Upper Abdomen: The gallbladder is absent.

Musculoskeletal: No chest wall abnormality. No acute or significant
osseous findings.

Review of the MIP images confirms the above findings.
IMPRESSION: 1. Areas of artifact involving the posterior lower lobe branches of
the bilateral pulmonary arteries, without definite evidence of
pulmonary embolism.
2. Mild bilateral lower lobe atelectasis.
3. Aortic atherosclerosis.

Aortic Atherosclerosis ([id number]).

## 2021-06-05 IMAGING — CT CT HEAD W/O CM
3 series · 16 of 47 positions shown, 19 images · non-contrast
Comparison: 03/22/2020

CLINICAL DATA: Altered mental status and recent syncopal episode

EXAM:
CT HEAD WITHOUT CONTRAST
TECHNIQUE: Contiguous axial images were obtained from the base of the skull
through the vertex without intravenous contrast.

[Series 1: head bone · axial · 0.39mm/px · z∈[+1313,+1451]mm · 10 of 81 slices shown, 13 images]
[im 6/81  brain]
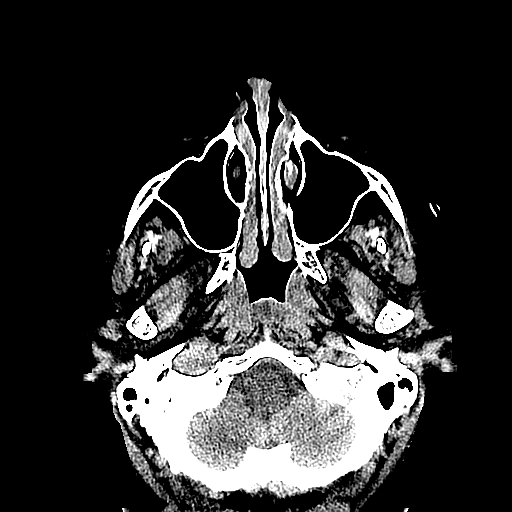
[im 6/81  bone]
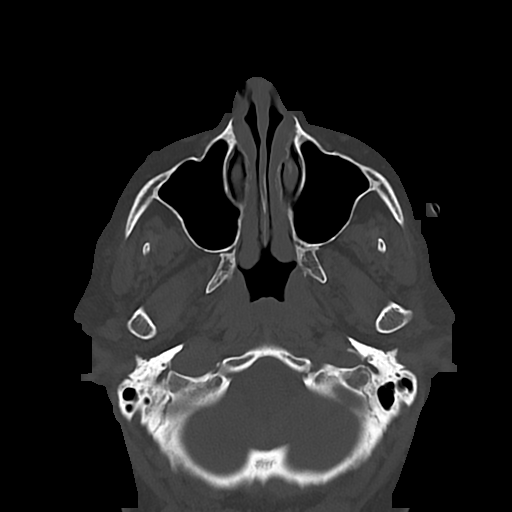
[im 14/81  brain]
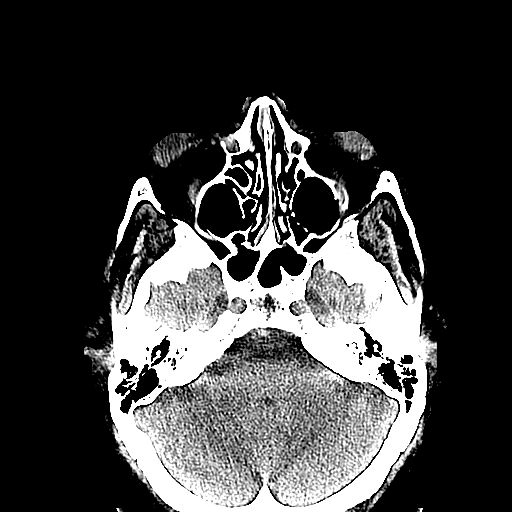
[im 23/81  brain]
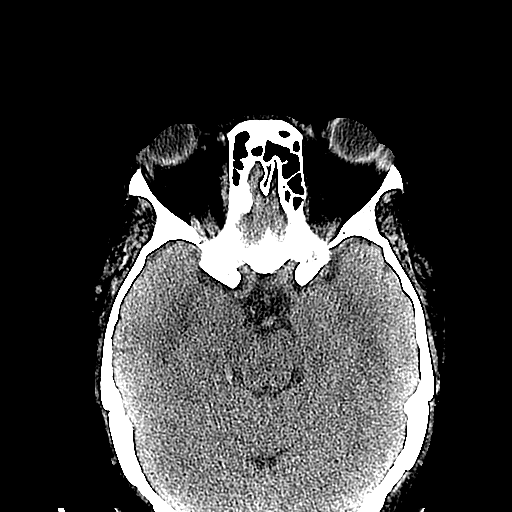
[im 28/81  brain]
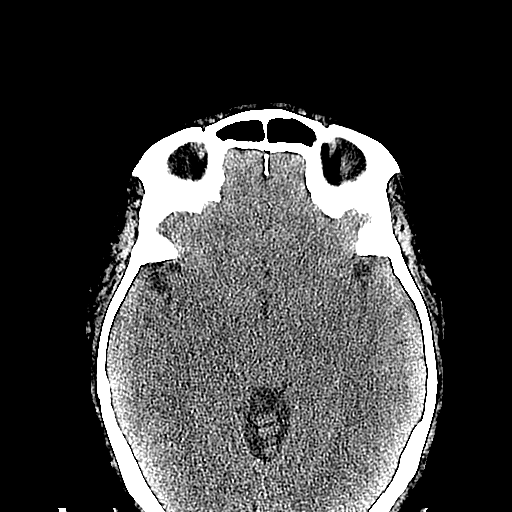
[im 36/81  brain]
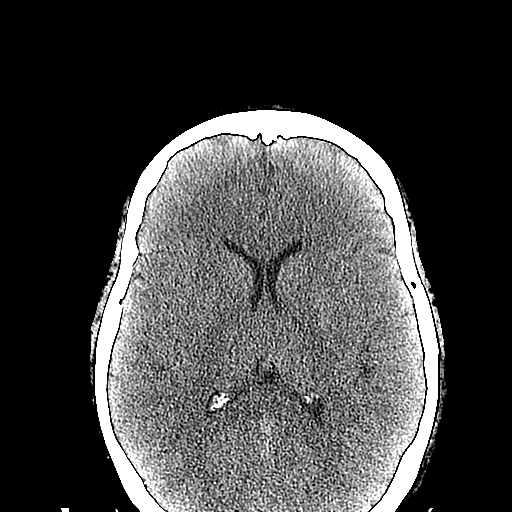
[im 36/81  bone]
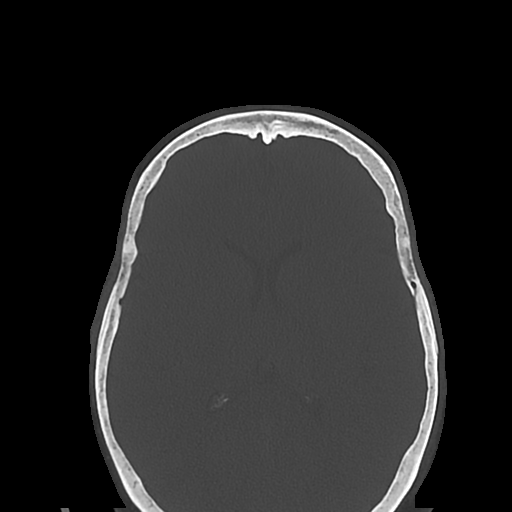
[im 45/81  brain]
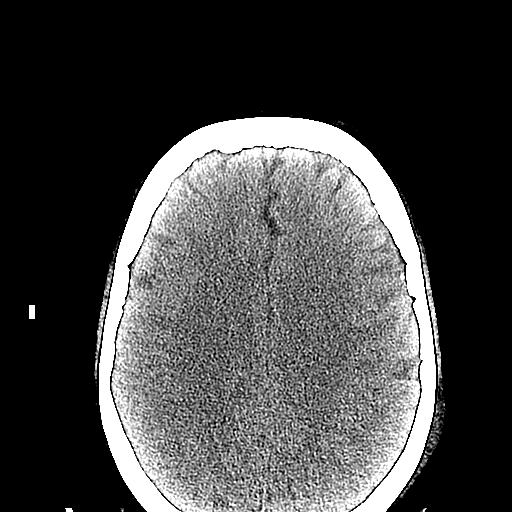
[im 53/81  brain]
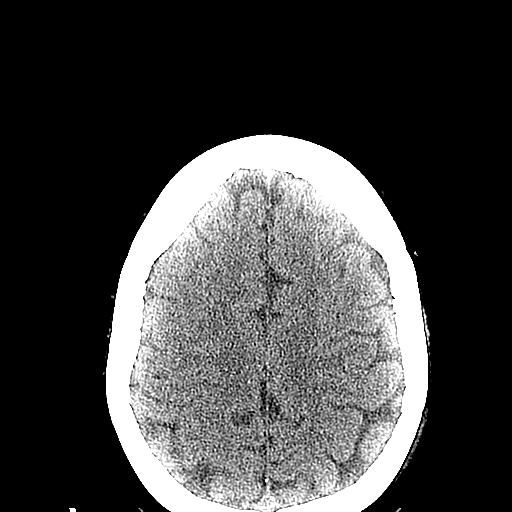
[im 61/81  brain]
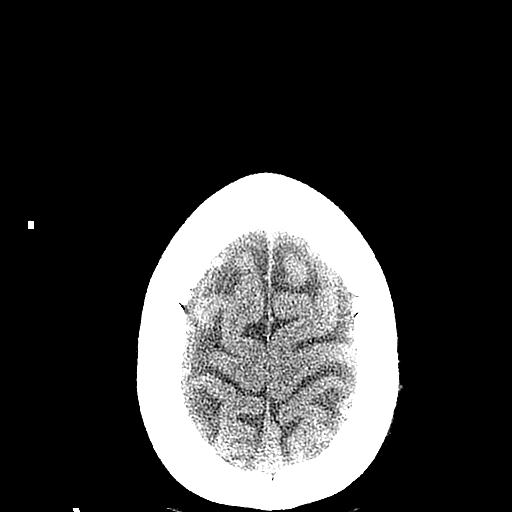
[im 67/81  brain]
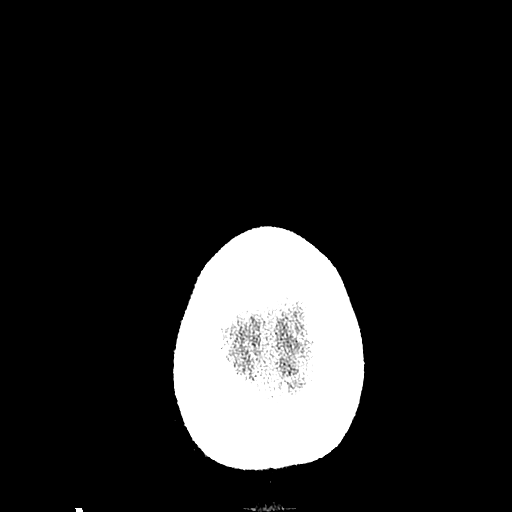
[im 67/81  bone]
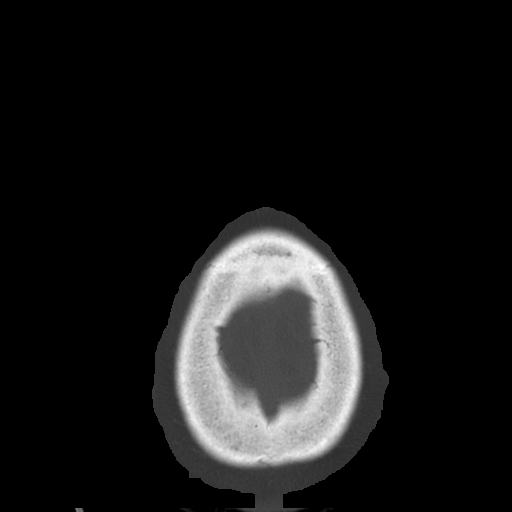
[im 75/81  brain]
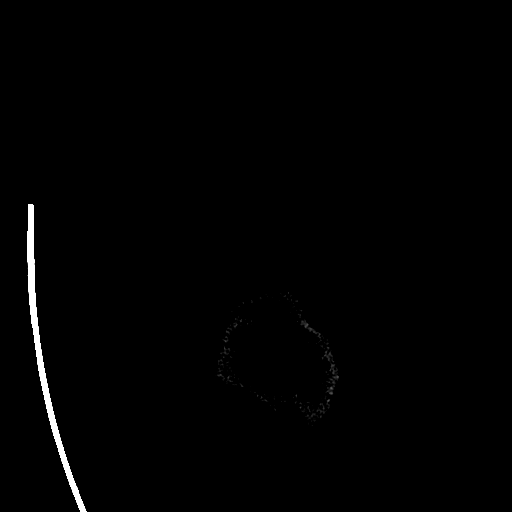

[Series 4: cor soft · coronal · 0.41mm/px · 3 of 72 slices shown]
[im 24/72  brain]
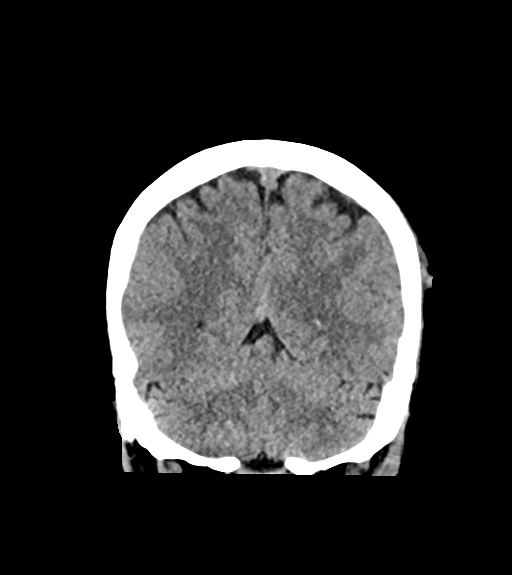
[im 32/72  brain]
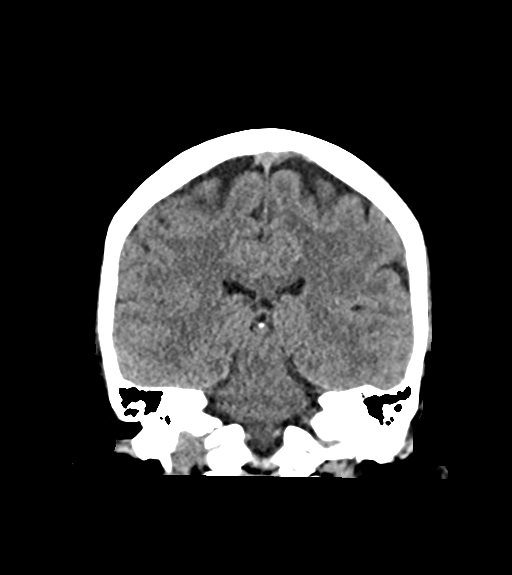
[im 40/72  brain]
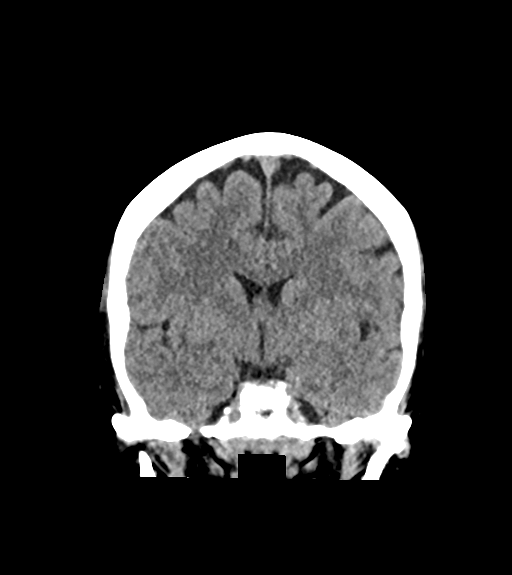

[Series 5: sag soft · sagittal · 0.33mm/px · 3 of 59 slices shown]
[im 20/59  brain]
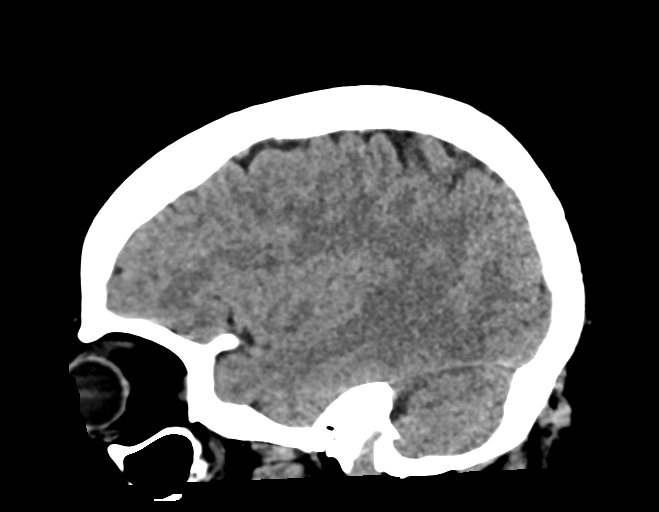
[im 30/59  brain]
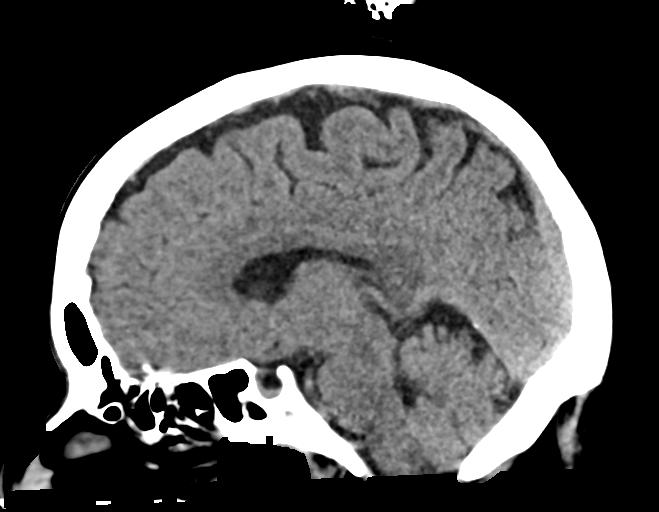
[im 39/59  brain]
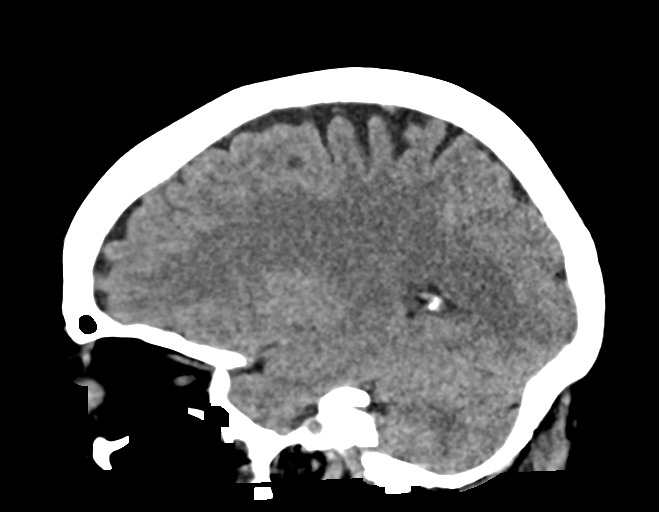

[16 of 47 positions shown; findings below may reference images not displayed]

FINDINGS: Brain: No evidence of acute infarction, hemorrhage, hydrocephalus,
extra-axial collection or mass lesion/mass effect.

Vascular: No hyperdense vessel or unexpected calcification.

Skull: Normal. Negative for fracture or focal lesion.

Sinuses/Orbits: No acute finding.

Other: None.
IMPRESSION: No acute intracranial abnormality noted.

## 2022-06-11 ENCOUNTER — Inpatient Hospital Stay (HOSPITAL_COMMUNITY)
Admission: EM | Admit: 2022-06-11 | Discharge: 2022-06-14 | DRG: 322 | Disposition: A | Discharge status: Home / Self Care | Payer: Medicare HMO | Source: Ambulatory Visit | Attending: Interventional Cardiology | Admitting: Interventional Cardiology

## 2022-06-11 ENCOUNTER — Encounter (HOSPITAL_COMMUNITY): Payer: Self-pay | Admitting: Interventional Cardiology

## 2022-06-11 ENCOUNTER — Other Ambulatory Visit: Payer: Self-pay

## 2022-06-11 ENCOUNTER — Encounter (HOSPITAL_COMMUNITY)
Admission: EM | Disposition: A | Discharge status: Home / Self Care | Payer: Self-pay | Source: Ambulatory Visit | Attending: Interventional Cardiology

## 2022-06-11 DIAGNOSIS — Z79899 Other long term (current) drug therapy: Secondary | ICD-10-CM | POA: Diagnosis not present

## 2022-06-11 DIAGNOSIS — I2111 ST elevation (STEMI) myocardial infarction involving right coronary artery: Secondary | ICD-10-CM | POA: Diagnosis not present

## 2022-06-11 DIAGNOSIS — F17210 Nicotine dependence, cigarettes, uncomplicated: Secondary | ICD-10-CM | POA: Diagnosis present

## 2022-06-11 DIAGNOSIS — I9589 Other hypotension: Secondary | ICD-10-CM | POA: Diagnosis not present

## 2022-06-11 DIAGNOSIS — F32A Depression, unspecified: Secondary | ICD-10-CM | POA: Diagnosis present

## 2022-06-11 DIAGNOSIS — I2510 Atherosclerotic heart disease of native coronary artery without angina pectoris: Secondary | ICD-10-CM | POA: Diagnosis not present

## 2022-06-11 DIAGNOSIS — I25110 Atherosclerotic heart disease of native coronary artery with unstable angina pectoris: Secondary | ICD-10-CM | POA: Diagnosis present

## 2022-06-11 DIAGNOSIS — Z886 Allergy status to analgesic agent status: Secondary | ICD-10-CM | POA: Diagnosis not present

## 2022-06-11 DIAGNOSIS — Z7901 Long term (current) use of anticoagulants: Secondary | ICD-10-CM | POA: Diagnosis not present

## 2022-06-11 DIAGNOSIS — G894 Chronic pain syndrome: Secondary | ICD-10-CM | POA: Diagnosis not present

## 2022-06-11 DIAGNOSIS — I739 Peripheral vascular disease, unspecified: Secondary | ICD-10-CM | POA: Diagnosis present

## 2022-06-11 DIAGNOSIS — Z86711 Personal history of pulmonary embolism: Secondary | ICD-10-CM | POA: Diagnosis not present

## 2022-06-11 DIAGNOSIS — M549 Dorsalgia, unspecified: Secondary | ICD-10-CM | POA: Diagnosis present

## 2022-06-11 DIAGNOSIS — J069 Acute upper respiratory infection, unspecified: Secondary | ICD-10-CM | POA: Diagnosis present

## 2022-06-11 DIAGNOSIS — M797 Fibromyalgia: Secondary | ICD-10-CM | POA: Diagnosis not present

## 2022-06-11 DIAGNOSIS — Z7902 Long term (current) use of antithrombotics/antiplatelets: Secondary | ICD-10-CM | POA: Diagnosis not present

## 2022-06-11 DIAGNOSIS — I2119 ST elevation (STEMI) myocardial infarction involving other coronary artery of inferior wall: Principal | ICD-10-CM | POA: Diagnosis present

## 2022-06-11 DIAGNOSIS — Z951 Presence of aortocoronary bypass graft: Secondary | ICD-10-CM | POA: Diagnosis not present

## 2022-06-11 DIAGNOSIS — I10 Essential (primary) hypertension: Secondary | ICD-10-CM | POA: Diagnosis not present

## 2022-06-11 DIAGNOSIS — Z888 Allergy status to other drugs, medicaments and biological substances status: Secondary | ICD-10-CM | POA: Diagnosis not present

## 2022-06-11 DIAGNOSIS — I200 Unstable angina: Secondary | ICD-10-CM

## 2022-06-11 DIAGNOSIS — E7800 Pure hypercholesterolemia, unspecified: Secondary | ICD-10-CM | POA: Diagnosis present

## 2022-06-11 DIAGNOSIS — I213 ST elevation (STEMI) myocardial infarction of unspecified site: Secondary | ICD-10-CM | POA: Diagnosis not present

## 2022-06-11 DIAGNOSIS — Z8249 Family history of ischemic heart disease and other diseases of the circulatory system: Secondary | ICD-10-CM | POA: Diagnosis not present

## 2022-06-11 DIAGNOSIS — Z86718 Personal history of other venous thrombosis and embolism: Secondary | ICD-10-CM | POA: Diagnosis not present

## 2022-06-11 DIAGNOSIS — Z955 Presence of coronary angioplasty implant and graft: Secondary | ICD-10-CM

## 2022-06-11 HISTORY — DX: Personal history of pulmonary embolism: Z86.711

## 2022-06-11 HISTORY — PX: CORONARY/GRAFT ACUTE MI REVASCULARIZATION: CATH118305

## 2022-06-11 HISTORY — PX: LEFT HEART CATH AND CORONARY ANGIOGRAPHY: CATH118249

## 2022-06-11 LAB — CBC WITH DIFFERENTIAL/PLATELET
Abs Immature Granulocytes: 0.06 10*3/uL (ref 0.00–0.07)
Basophils Absolute: 0 10*3/uL (ref 0.0–0.1)
Basophils Relative: 0 %
Eosinophils Absolute: 0.1 10*3/uL (ref 0.0–0.5)
Eosinophils Relative: 1 %
HCT: 42.8 % (ref 36.0–46.0)
Hemoglobin: 14.1 g/dL (ref 12.0–15.0)
Immature Granulocytes: 1 %
Lymphocytes Relative: 33 %
Lymphs Abs: 1.8 10*3/uL (ref 0.7–4.0)
MCH: 31.5 pg (ref 26.0–34.0)
MCHC: 32.9 g/dL (ref 30.0–36.0)
MCV: 95.5 fL (ref 80.0–100.0)
Monocytes Absolute: 0.6 10*3/uL (ref 0.1–1.0)
Monocytes Relative: 12 %
Neutro Abs: 2.9 10*3/uL (ref 1.7–7.7)
Neutrophils Relative %: 53 %
Platelets: 154 10*3/uL (ref 150–400)
RBC: 4.48 MIL/uL (ref 3.87–5.11)
RDW: 14.2 % (ref 11.5–15.5)
WBC: 5.5 10*3/uL (ref 4.0–10.5)
nRBC: 0 % (ref 0.0–0.2)

## 2022-06-11 LAB — LIPID PANEL
Cholesterol: 200 mg/dL (ref 0–200)
HDL: 37 mg/dL — ABNORMAL LOW (ref >40)
LDL Cholesterol: 144 mg/dL — ABNORMAL HIGH (ref 0–99)
Total CHOL/HDL Ratio: 5.4 RATIO
Triglycerides: 97 mg/dL (ref <150)
VLDL: 19 mg/dL (ref 0–40)

## 2022-06-11 LAB — COMPREHENSIVE METABOLIC PANEL
ALT: 10 U/L (ref 0–44)
AST: 19 U/L (ref 15–41)
Albumin: 2.8 g/dL — ABNORMAL LOW (ref 3.5–5.0)
Alkaline Phosphatase: 66 U/L (ref 38–126)
Anion gap: 13 (ref 5–15)
BUN: 13 mg/dL (ref 6–20)
CO2: 21 mmol/L — ABNORMAL LOW (ref 22–32)
Calcium: 8.7 mg/dL — ABNORMAL LOW (ref 8.9–10.3)
Chloride: 104 mmol/L (ref 98–111)
Creatinine, Ser: 0.91 mg/dL (ref 0.44–1.00)
GFR, Estimated: >60 mL/min (ref >60)
Glucose, Bld: 122 mg/dL — ABNORMAL HIGH (ref 70–99)
Potassium: 3 mmol/L — ABNORMAL LOW (ref 3.5–5.1)
Sodium: 138 mmol/L (ref 135–145)
Total Bilirubin: 0.5 mg/dL (ref 0.3–1.2)
Total Protein: 5.8 g/dL — ABNORMAL LOW (ref 6.5–8.1)

## 2022-06-11 LAB — PROTIME-INR
INR: 1.3 — ABNORMAL HIGH (ref 0.8–1.2)
Prothrombin Time: 16.5 seconds — ABNORMAL HIGH (ref 11.4–15.2)

## 2022-06-11 LAB — POCT ACTIVATED CLOTTING TIME
Activated Clotting Time: 317 seconds
Activated Clotting Time: 390 seconds

## 2022-06-11 LAB — BRAIN NATRIURETIC PEPTIDE: B Natriuretic Peptide: 10.9 pg/mL (ref 0.0–100.0)

## 2022-06-11 LAB — MRSA NEXT GEN BY PCR, NASAL: MRSA by PCR Next Gen: NOT DETECTED

## 2022-06-11 LAB — TROPONIN I (HIGH SENSITIVITY)
Troponin I (High Sensitivity): 7 ng/L (ref <18)
Troponin I (High Sensitivity): 1812 ng/L (ref <18)

## 2022-06-11 LAB — HEMOGLOBIN A1C
Hgb A1c MFr Bld: 5.6 % (ref 4.8–5.6)
Mean Plasma Glucose: 114.02 mg/dL

## 2022-06-11 LAB — APTT: aPTT: >200 seconds (ref 24–36)

## 2022-06-11 SURGERY — CORONARY/GRAFT ACUTE MI REVASCULARIZATION
Anesthesia: LOCAL

## 2022-06-11 MED ORDER — ATROPINE SULFATE 1 MG/10ML IJ SOSY
PREFILLED_SYRINGE | INTRAMUSCULAR | Status: DC | PRN
  Administered 2022-06-11 (×2): .5 mg via INTRAVENOUS

## 2022-06-11 MED ORDER — ATROPINE SULFATE 1 MG/10ML IJ SOSY
PREFILLED_SYRINGE | INTRAMUSCULAR | Status: AC
  Filled 2022-06-11: qty 10

## 2022-06-11 MED ORDER — SODIUM CHLORIDE 0.9 % WEIGHT BASED INFUSION: 1.0000 mL/kg/h | INTRAVENOUS | Status: AC

## 2022-06-11 MED ORDER — VERAPAMIL HCL 2.5 MG/ML IV SOLN
INTRAVENOUS | Status: AC
  Filled 2022-06-11: qty 2

## 2022-06-11 MED ORDER — FENTANYL CITRATE (PF) 100 MCG/2ML IJ SOLN
INTRAMUSCULAR | Status: DC | PRN
  Administered 2022-06-11: 25 ug via INTRAVENOUS

## 2022-06-11 MED ORDER — LABETALOL HCL 5 MG/ML IV SOLN: 10.0000 mg | INTRAVENOUS | Status: AC | PRN

## 2022-06-11 MED ORDER — SODIUM CHLORIDE 0.9% FLUSH
3.0000 mL | Freq: Two times a day (BID) | INTRAVENOUS | Status: DC
  Administered 2022-06-12 – 2022-06-13 (×3): 3 mL via INTRAVENOUS

## 2022-06-11 MED ORDER — POTASSIUM CHLORIDE CRYS ER 20 MEQ PO TBCR
40.0000 meq | EXTENDED_RELEASE_TABLET | Freq: Once | ORAL | Status: AC
  Administered 2022-06-11: 40 meq via ORAL
  Filled 2022-06-11: qty 2

## 2022-06-11 MED ORDER — CLOPIDOGREL BISULFATE 300 MG PO TABS
ORAL_TABLET | ORAL | Status: DC | PRN
  Administered 2022-06-11: 300 mg via ORAL

## 2022-06-11 MED ORDER — METOPROLOL TARTRATE 25 MG PO TABS
25.0000 mg | ORAL_TABLET | Freq: Two times a day (BID) | ORAL | Status: DC
  Administered 2022-06-11 – 2022-06-14 (×5): 25 mg via ORAL
  Filled 2022-06-11 (×6): qty 1

## 2022-06-11 MED ORDER — HEPARIN SODIUM (PORCINE) 5000 UNIT/ML IJ SOLN
5000.0000 [IU] | Freq: Three times a day (TID) | INTRAMUSCULAR | Status: DC
  Administered 2022-06-11 – 2022-06-13 (×5): 5000 [IU] via SUBCUTANEOUS
  Filled 2022-06-11 (×5): qty 1

## 2022-06-11 MED ORDER — CLOPIDOGREL BISULFATE 300 MG PO TABS
ORAL_TABLET | ORAL | Status: AC
  Filled 2022-06-11: qty 1

## 2022-06-11 MED ORDER — HEPARIN SODIUM (PORCINE) 1000 UNIT/ML IJ SOLN
INTRAMUSCULAR | Status: AC
  Filled 2022-06-11: qty 10

## 2022-06-11 MED ORDER — HYDRALAZINE HCL 20 MG/ML IJ SOLN: 10.0000 mg | INTRAMUSCULAR | Status: AC | PRN

## 2022-06-11 MED ORDER — PANTOPRAZOLE SODIUM 40 MG PO TBEC
40.0000 mg | DELAYED_RELEASE_TABLET | Freq: Every day | ORAL | Status: DC
  Administered 2022-06-11: 40 mg via ORAL
  Filled 2022-06-11: qty 1

## 2022-06-11 MED ORDER — LIDOCAINE HCL (PF) 1 % IJ SOLN
INTRAMUSCULAR | Status: AC
  Filled 2022-06-11: qty 30

## 2022-06-11 MED ORDER — GABAPENTIN 400 MG PO CAPS
400.0000 mg | ORAL_CAPSULE | Freq: Three times a day (TID) | ORAL | Status: DC
  Administered 2022-06-11 – 2022-06-12 (×2): 400 mg via ORAL
  Filled 2022-06-11 (×2): qty 1

## 2022-06-11 MED ORDER — ROSUVASTATIN CALCIUM 20 MG PO TABS
40.0000 mg | ORAL_TABLET | Freq: Every day | ORAL | Status: DC
  Administered 2022-06-11 – 2022-06-13 (×3): 40 mg via ORAL
  Filled 2022-06-11 (×3): qty 2

## 2022-06-11 MED ORDER — VERAPAMIL HCL 2.5 MG/ML IV SOLN
INTRAVENOUS | Status: DC | PRN
  Administered 2022-06-11: 10 mL via INTRA_ARTERIAL

## 2022-06-11 MED ORDER — MIDAZOLAM HCL 2 MG/2ML IJ SOLN
INTRAMUSCULAR | Status: DC | PRN
  Administered 2022-06-11: 1 mg via INTRAVENOUS

## 2022-06-11 MED ORDER — ONDANSETRON HCL 4 MG/2ML IJ SOLN
INTRAMUSCULAR | Status: AC
  Filled 2022-06-11: qty 2

## 2022-06-11 MED ORDER — SODIUM CHLORIDE 0.9 % IV SOLN: 250.0000 mL | INTRAVENOUS | Status: DC | PRN

## 2022-06-11 MED ORDER — ONDANSETRON HCL 4 MG/2ML IJ SOLN
INTRAMUSCULAR | Status: DC | PRN
  Administered 2022-06-11: 4 mg via INTRAVENOUS

## 2022-06-11 MED ORDER — SODIUM CHLORIDE 0.9 % IV BOLUS
INTRAVENOUS | Status: AC | PRN
  Administered 2022-06-11: 250 mL via INTRAVENOUS

## 2022-06-11 MED ORDER — NITROGLYCERIN 1 MG/10 ML FOR IR/CATH LAB
INTRA_ARTERIAL | Status: DC | PRN
  Administered 2022-06-11: 100 ug via INTRACORONARY

## 2022-06-11 MED ORDER — HEPARIN (PORCINE) IN NACL 1000-0.9 UT/500ML-% IV SOLN
INTRAVENOUS | Status: AC
  Filled 2022-06-11: qty 1000

## 2022-06-11 MED ORDER — MIDAZOLAM HCL 2 MG/2ML IJ SOLN
INTRAMUSCULAR | Status: AC
  Filled 2022-06-11: qty 2

## 2022-06-11 MED ORDER — IOHEXOL 350 MG/ML SOLN
INTRAVENOUS | Status: DC | PRN
  Administered 2022-06-11: 145 mL

## 2022-06-11 MED ORDER — ESCITALOPRAM OXALATE 10 MG PO TABS
20.0000 mg | ORAL_TABLET | Freq: Every day | ORAL | Status: DC
  Administered 2022-06-12 – 2022-06-14 (×3): 20 mg via ORAL
  Filled 2022-06-11 (×3): qty 2

## 2022-06-11 MED ORDER — HEPARIN SODIUM (PORCINE) 1000 UNIT/ML IJ SOLN
INTRAMUSCULAR | Status: DC | PRN
  Administered 2022-06-11: 5000 [IU] via INTRAVENOUS
  Administered 2022-06-11: 8000 [IU] via INTRAVENOUS

## 2022-06-11 MED ORDER — NITROGLYCERIN 1 MG/10 ML FOR IR/CATH LAB
INTRA_ARTERIAL | Status: AC
  Filled 2022-06-11: qty 10

## 2022-06-11 MED ORDER — SODIUM CHLORIDE 0.9 % IV SOLN
INTRAVENOUS | Status: AC | PRN
  Administered 2022-06-11: 10 mL/h via INTRAVENOUS

## 2022-06-11 MED ORDER — ONDANSETRON HCL 4 MG/2ML IJ SOLN: 4.0000 mg | Freq: Four times a day (QID) | INTRAMUSCULAR | Status: DC | PRN

## 2022-06-11 MED ORDER — HEPARIN (PORCINE) IN NACL 1000-0.9 UT/500ML-% IV SOLN
INTRAVENOUS | Status: DC | PRN
  Administered 2022-06-11 (×2): 500 mL

## 2022-06-11 MED ORDER — ACETAMINOPHEN 325 MG PO TABS
650.0000 mg | ORAL_TABLET | ORAL | Status: DC | PRN
  Administered 2022-06-12 – 2022-06-13 (×2): 650 mg via ORAL
  Filled 2022-06-11 (×2): qty 2

## 2022-06-11 MED ORDER — DICYCLOMINE HCL 20 MG PO TABS
20.0000 mg | ORAL_TABLET | Freq: Three times a day (TID) | ORAL | Status: DC
  Administered 2022-06-11 – 2022-06-14 (×8): 20 mg via ORAL
  Filled 2022-06-11 (×10): qty 1

## 2022-06-11 MED ORDER — TRAZODONE HCL 50 MG PO TABS: 200.0000 mg | ORAL_TABLET | Freq: Every evening | ORAL | Status: DC | PRN

## 2022-06-11 MED ORDER — NOREPINEPHRINE 4 MG/250ML-% IV SOLN
INTRAVENOUS | Status: AC
  Filled 2022-06-11: qty 250

## 2022-06-11 MED ORDER — TIZANIDINE HCL 4 MG PO TABS
4.0000 mg | ORAL_TABLET | Freq: Three times a day (TID) | ORAL | Status: DC
  Administered 2022-06-11 – 2022-06-12 (×2): 4 mg via ORAL
  Filled 2022-06-11 (×2): qty 1

## 2022-06-11 MED ORDER — HYDRALAZINE HCL 25 MG PO TABS
25.0000 mg | ORAL_TABLET | Freq: Three times a day (TID) | ORAL | Status: DC
  Administered 2022-06-11: 25 mg via ORAL
  Filled 2022-06-11 (×2): qty 1

## 2022-06-11 MED ORDER — POTASSIUM CHLORIDE 10 MEQ/100ML IV SOLN: 10.0000 meq | INTRAVENOUS | Status: DC

## 2022-06-11 MED ORDER — SODIUM CHLORIDE 0.9% FLUSH: 3.0000 mL | INTRAVENOUS | Status: DC | PRN

## 2022-06-11 MED ORDER — FENTANYL CITRATE (PF) 100 MCG/2ML IJ SOLN
INTRAMUSCULAR | Status: AC
  Filled 2022-06-11: qty 2

## 2022-06-11 MED ORDER — NOREPINEPHRINE BITARTRATE 1 MG/ML IV SOLN
INTRAVENOUS | Status: DC | PRN
  Administered 2022-06-11: 3 ug/kg/min via INTRAVENOUS

## 2022-06-11 MED ORDER — TOPIRAMATE 100 MG PO TABS
100.0000 mg | ORAL_TABLET | Freq: Every day | ORAL | Status: DC
  Administered 2022-06-11 – 2022-06-14 (×4): 100 mg via ORAL
  Filled 2022-06-11 (×4): qty 1

## 2022-06-11 MED ORDER — LIDOCAINE HCL (PF) 1 % IJ SOLN
INTRAMUSCULAR | Status: DC | PRN
  Administered 2022-06-11 (×2): 2 mL via INTRADERMAL

## 2022-06-11 MED ORDER — CLOPIDOGREL BISULFATE 75 MG PO TABS
75.0000 mg | ORAL_TABLET | Freq: Every day | ORAL | Status: DC
  Administered 2022-06-12 – 2022-06-14 (×3): 75 mg via ORAL
  Filled 2022-06-11 (×3): qty 1

## 2022-06-11 SURGICAL SUPPLY — 21 items
BALL SAPPHIRE NC24 3.75X10 (BALLOONS) ×1
BALLN SAPPHIRE 2.0X12 (BALLOONS) ×1
BALLN SAPPHIRE 3.0X15 (BALLOONS) ×1
BALLOON SAPPHIRE 2.0X12 (BALLOONS) IMPLANT
BALLOON SAPPHIRE 3.0X15 (BALLOONS) IMPLANT
BALLOON SAPPHIRE NC24 3.75X10 (BALLOONS) IMPLANT
BAND ZEPHYR COMPRESS 30 LONG (HEMOSTASIS) IMPLANT
CATH INFINITI 5 FR JL3.5 (CATHETERS) IMPLANT
CATH INFINITI JR4 5F (CATHETERS) IMPLANT
CATH VISTA GUIDE 6FR JR4 (CATHETERS) IMPLANT
GLIDESHEATH SLEND A-KIT 6F 22G (SHEATH) IMPLANT
GUIDEWIRE INQWIRE 1.5J.035X260 (WIRE) IMPLANT
INQWIRE 1.5J .035X260CM (WIRE) ×1
KIT ENCORE 26 ADVANTAGE (KITS) IMPLANT
KIT HEART LEFT (KITS) ×1 IMPLANT
PACK CARDIAC CATHETERIZATION (CUSTOM PROCEDURE TRAY) ×1 IMPLANT
STENT SYNERGY XD 3.50X20 (Permanent Stent) IMPLANT
SYNERGY XD 3.50X20 (Permanent Stent) ×1 IMPLANT
TRANSDUCER W/STOPCOCK (MISCELLANEOUS) ×1 IMPLANT
TUBING CIL FLEX 10 FLL-RA (TUBING) ×1 IMPLANT
WIRE ASAHI PROWATER 180CM (WIRE) IMPLANT

## 2022-06-11 NOTE — H&P (Addendum)
The patient has been seen in conjunction with Reino Bellis, NP. All aspects of care have been considered and discussed. The patient has been personally interviewed, examined, and all clinical data has been reviewed.  She was met in the ambulance bay, having 7/10 intensity pain. ECG with inferior STEMI pattern(STE 2,3,and AVF). BP 100/70 mm Hg dropping to 80 mm Hg while being interviewed and examined before leaving the ED Confusion initially about "prior bypass". She was referring to aortofemoral bypass. She is on Eliquis and plavix. Eliquis was used for PE several years ago and never stopped. She is allergic to aspirin (hives). Hand h/o Hypertension, smoking, hyperlipidemia. Neck veins are not visible and difficult to assess due to body habitus. Chest clear and no significant murmurs. Radial pulse 2 + right radial and 1+ left. Poterior tials pulses 2 +. Neuro without focal abnormality, oriented and uncomfortable. ACUTE INFERIOR STEMI PLAN: Mechanical revascularization from ridial approach with stent to RCA or circumflex depending upon findings. Emergency consent after explanation of procedure, risks (Stroke/CABG/Death/AKI/etc.), and benefits (myocardial salvage).   Critical Care Time: 35 minutes    Cardiology Admission History and Physical   Patient ID: Selena Clark MRN: [id number]; DOB: 10/30/1960   Admission date: 06/11/2022  PCP:  Kateri Mc, MD   [facility] Cardiologist:  Helene Kelp, MD     Chief Complaint:  Chest pain/STEMI  Patient Profile:   Selena Clark is a 61 y.o. female with nonobstructive CAD, PVD s/p aorto bifurcation bypass graft, HTN, HLD, PE on Eliquis, tobacco use who is being seen 06/11/2022 for the evaluation of chest pain/STEMI.  History of Present Illness:   Selena Clark is a 61 yo female with PMH noted above. She has been followed most recently through cardiology at [facility]. Prior notes indicate she has a hx of  CABG, but able to confirm this is false. She was seen by heart care 03/2020 in the setting of syncope and elevated troponins.  She was evaluated with no adverse arrhythmias on telemetry.  Echocardiogram without acute finding.  She was found to have a subsegmental PE which was likely the etiology for her elevated troponin at that time.  She has been followed by vascular and status post aortofemoral bypass 3 years prior and has been maintained on Plavix.  She has an allergy to aspirin with hives.  Cardiac catheterization 10/2013 with proximal LAD 40 to 50% stenosis with normal FFR, D1 medium caliber vessel with no significant disease, percent stenosis left circumflex just before OM 2 normal RFR, RCA with 30% proximal/mid vessel.  She was last seen in her cardiologist office 11/2021 metoprolol 25 mg daily, Plavix 75 mg daily, Eliquis and Crestor 40 mg daily.  Transported to the ED 11/7 after developing episode of centralized chest pain while watching TV on the couch this afternoon.  She took a sublingual nitroglycerin and walked to the bathroom.  She had a syncopal episode and was found on the floor by her husband.  He called 911.  EKG showed sinus rhythm, 98 bpm with inferior ST elevation.  Code STEMI was called in the field.  No aspirin allergy.  She was brought directly to the Cath Lab for emergent cardiac catheterization.  Past Medical History:  Diagnosis Date   CAD (coronary artery disease)    Coronary artery disease    Depression    Fibromyalgia    Headache    History of pulmonary embolus (PE)    HTN (hypertension)  Hypercholesteremia    Hypertension     Past Surgical History:  Procedure Laterality Date   BACK SURGERY     CARDIAC SURGERY     CARPAL TUNNEL RELEASE     CHOLECYSTECTOMY     CORONARY ARTERY BYPASS GRAFT     HERNIA REPAIR     TUBAL LIGATION       Medications Prior to Admission: Prior to Admission medications   Medication Sig Start Date End Date Taking? Authorizing  Provider  acetaminophen (TYLENOL) 500 MG tablet Take 500 mg by mouth in the morning and at bedtime.    [provider]  apixaban (ELIQUIS) 5 MG TABS tablet Take 1 tablet (5 mg total) by mouth 2 (two) times daily. 03/29/20 04/28/20  Pahwani, Ravi, MD  dicyclomine (BENTYL) 20 MG tablet Take 20 mg by mouth 3 (three) times daily. 02/20/20   [provider]  escitalopram (LEXAPRO) 20 MG tablet Take 20 mg by mouth daily. 03/22/20   [provider]  gabapentin (NEURONTIN) 400 MG capsule Take 400 mg by mouth in the morning, at noon, and at bedtime. 02/29/20   [provider]  hydrALAZINE (APRESOLINE) 10 MG tablet Take 2.5 tablets (25 mg total) by mouth 3 (three) times daily. 04/02/20   Akula, Vijaya, MD  metoprolol tartrate (LOPRESSOR) 25 MG tablet Take 25 mg by mouth 2 (two) times daily. 03/16/20   [provider]  pantoprazole (PROTONIX) 40 MG tablet Take 40 mg by mouth daily. 02/23/20   [provider]  rosuvastatin (CRESTOR) 40 MG tablet Take 40 mg by mouth daily. 01/25/20   [provider]  tiZANidine (ZANAFLEX) 4 MG tablet Take 4 mg by mouth 3 (three) times daily. 02/20/20   [provider]  topiramate (TOPAMAX) 100 MG tablet Take 100 mg by mouth daily. 03/07/20   [provider]  traZODone (DESYREL) 100 MG tablet Take 200 mg by mouth at bedtime as needed for sleep. 03/02/20   [provider]     Allergies:    Allergies  Allergen Reactions   Albuterol Nausea And Vomiting   Aspirin Hives    Social History:   Social History   Socioeconomic History   Marital status: Single    Spouse name: Not on file   Number of children: Not on file   Years of education: Not on file   Highest education level: Not on file  Occupational History   Not on file  Tobacco Use   Smoking status: Every Day    Packs/day: 1.00    Types: Cigarettes   Smokeless tobacco: Never  Substance and Sexual Activity   Alcohol use: No   Drug use:  No   Sexual activity: Not on file  Other Topics Concern   Not on file  Social History Narrative   ** Merged History Encounter **       Social Determinants of Health   Financial Resource Strain: Not on file  Food Insecurity: Not on file  Transportation Needs: Not on file  Physical Activity: Not on file  Stress: Not on file  Social Connections: Not on file  Intimate Partner Violence: Not on file    Family History:   The patient's family history includes Hypertension in her mother.    ROS:  Please see the history of present illness.  All other ROS reviewed and negative.     Physical Exam/Data:   Vitals:   06/11/22 1534 06/11/22 1543  SpO2:  96%  Weight: 84.8 kg     Height: 5' 2.5" (1.588 m)    No intake or output data in the 24 hours ending 06/11/22 1622    06/11/2022    3:34 PM 04/01/2020    2:14 AM 03/25/2020    3:25 AM  Last 3 Weights  Weight (lbs) 187 lb 196 lb 3.4 oz 182 lb 11.2 oz  Weight (kg) 84.823 kg 89 kg 82.872 kg     Body mass index is 33.66 kg/m.  General:  Well nourished, well developed, in no acute distress HEENT: normal Neck: no JVD Vascular: No carotid bruits; Distal pulses 2+ bilaterally   Cardiac:  normal S1, S2; RRR; no murmur  Lungs:  clear to auscultation bilaterally, no wheezing, rhonchi or rales  Abd: soft, nontender, no hepatomegaly  Ext: no edema Musculoskeletal:  No deformities, BUE and BLE strength normal and equal Skin: warm and dry  Neuro:  CNs 2-12 intact, no focal abnormalities noted Psych:  Normal affect    EKG:  The ECG that was done 11/7 was personally reviewed and demonstrates this rhythm, 98 bpm, ST elevation in inferior leads  Relevant CV Studies:  Echo: 03/2020  IMPRESSIONS     1. Left ventricular ejection fraction, by estimation, is 60 to 65%. The  left ventricle has normal function. The left ventricle has no regional  wall motion abnormalities. Left ventricular diastolic parameters are  consistent with Grade I  diastolic  dysfunction (impaired relaxation).   2. Right ventricular systolic function is normal. The right ventricular  size is normal.   3. The mitral valve is normal in structure. No evidence of mitral valve  regurgitation. No evidence of mitral stenosis.   4. The aortic valve is tricuspid. Aortic valve regurgitation is not  visualized. No aortic stenosis is present.   5. The inferior vena cava is normal in size with greater than 50%  respiratory variability, suggesting right atrial pressure of 3 mmHg.   FINDINGS   Left Ventricle: Left ventricular ejection fraction, by estimation, is 60  to 65%. The left ventricle has normal function. The left ventricle has no  regional wall motion abnormalities. The left ventricular internal cavity  size was normal in size. There is   no left ventricular hypertrophy. Left ventricular diastolic parameters  are consistent with Grade I diastolic dysfunction (impaired relaxation).   Right Ventricle: The right ventricular size is normal.Right ventricular  systolic function is normal.   Left Atrium: Left atrial size was normal in size.   Right Atrium: Right atrial size was normal in size.   Pericardium: There is no evidence of pericardial effusion.   Mitral Valve: The mitral valve is normal in structure. Normal mobility of  the mitral valve leaflets. No evidence of mitral valve regurgitation. No  evidence of mitral valve stenosis.   Tricuspid Valve: The tricuspid valve is normal in structure. Tricuspid  valve regurgitation is trivial. No evidence of tricuspid stenosis.   Aortic Valve: The aortic valve is tricuspid. Aortic valve regurgitation is  not visualized. No aortic stenosis is present.   Pulmonic Valve: The pulmonic valve was normal in structure. Pulmonic valve  regurgitation is not visualized. No evidence of pulmonic stenosis.   Aorta: The aortic root is normal in size and structure.   Venous: The inferior vena cava is normal in size  with greater than 50%  respiratory variability, suggesting right atrial pressure of 3 mmHg.   IAS/Shunts: No atrial level shunt detected by color flow Doppler.    Laboratory Data:  High Sensitivity Troponin:    No results for input(s): "TROPONINIHS" in the last 720 hours.    ChemistryNo results for input(s): "NA", "K", "CL", "CO2", "GLUCOSE", "BUN", "CREATININE", "CALCIUM", "MG", "GFRNONAA", "GFRAA", "ANIONGAP" in the last 168 hours.  No results for input(s): "PROT", "ALBUMIN", "AST", "ALT", "ALKPHOS", "BILITOT" in the last 168 hours. Lipids No results for input(s): "CHOL", "TRIG", "HDL", "LABVLDL", "LDLCALC", "CHOLHDL" in the last 168 hours. HematologyNo results for input(s): "WBC", "RBC", "HGB", "HCT", "MCV", "MCH", "MCHC", "RDW", "PLT" in the last 168 hours. Thyroid No results for input(s): "TSH", "FREET4" in the last 168 hours. BNPNo results for input(s): "BNP", "PROBNP" in the last 168 hours.  DDimer No results for input(s): "DDIMER" in the last 168 hours.   Radiology/Studies:  No results found.   Assessment and Plan:   Selena Clark is a 61 y.o. female with nonobstructive CAD, PVD s/p aorto bifurcation bypass graft, HTN, HLD, PE on Eliquis, tobacco use who is being seen 06/11/2022 for the evaluation of chest pain/STEMI.  STEMI -- Developed sudden onset chest pain afternoon prior to admission.  Took sublingual nitroglycerin and had full episode.  EKG on EMS arrival showed sinus rhythm with ST elevation in inferior leads.  Code STEMI was called in the field.  Aspirin was deferred as she has an allergy with hives -- she was taken emergently to the cath lab for further evaluation -- further recommendations post cath, anticipate routine post MI care -- echo  PVD status post L aortofemoral bypass -- had been maintained on Plavix, and statin  History of PE/DVT -- On Eliquis 5 mg twice daily, last dose was the morning of 11/7 -- IV heparin for now  HTN -- PTA meds metoprolol  25mg BID, hydralazine 25mg TID  HLD -- on crestor 40mg PTA -- check lipids in am  Risk Assessment/Risk Scores:   TIMI Risk Score for ST  Elevation MI:   The patient's TIMI risk score is 1, which indicates a 1.6% risk of all cause mortality at 30 days.  Severity of Illness: The appropriate patient status for this patient is INPATIENT. Inpatient status is judged to be reasonable and necessary in order to provide the required intensity of service to ensure the patient's safety. The patient's presenting symptoms, physical exam findings, and initial radiographic and laboratory data in the context of their chronic comorbidities is felt to place them at high risk for further clinical deterioration. Furthermore, it is not anticipated that the patient will be medically stable for discharge from the hospital within 2 midnights of admission.   * I certify that at the point of admission it is my clinical judgment that the patient will require inpatient hospital care spanning beyond 2 midnights from the point of admission due to high intensity of service, high risk for further deterioration and high frequency of surveillance required.*   For questions or updates, please contact [facility] Please consult [email or web address] for contact info under     Signed, Lindsay Roberts, NP  06/11/2022 4:22 PM   

## 2022-06-12 ENCOUNTER — Inpatient Hospital Stay (HOSPITAL_COMMUNITY): Payer: Medicare HMO

## 2022-06-12 ENCOUNTER — Encounter (HOSPITAL_COMMUNITY): Payer: Self-pay | Admitting: Interventional Cardiology

## 2022-06-12 DIAGNOSIS — I213 ST elevation (STEMI) myocardial infarction of unspecified site: Secondary | ICD-10-CM

## 2022-06-12 DIAGNOSIS — I2119 ST elevation (STEMI) myocardial infarction involving other coronary artery of inferior wall: Secondary | ICD-10-CM | POA: Diagnosis not present

## 2022-06-12 LAB — POCT I-STAT, CHEM 8
BUN: 12 mg/dL (ref 6–20)
Calcium, Ion: 1.2 mmol/L (ref 1.15–1.40)
Chloride: 102 mmol/L (ref 98–111)
Creatinine, Ser: 0.8 mg/dL (ref 0.44–1.00)
Glucose, Bld: 122 mg/dL — ABNORMAL HIGH (ref 70–99)
HCT: 44 % (ref 36.0–46.0)
Hemoglobin: 15 g/dL (ref 12.0–15.0)
Potassium: 3 mmol/L — ABNORMAL LOW (ref 3.5–5.1)
Sodium: 139 mmol/L (ref 135–145)
TCO2: 24 mmol/L (ref 22–32)

## 2022-06-12 LAB — ECHOCARDIOGRAM COMPLETE
Area-P 1/2: 5.66 cm2
Height: 62.5 in
MV M vel: 1.22 m/s
MV Peak grad: 6 mmHg
S' Lateral: 2.8 cm
Weight: 3097.02 oz

## 2022-06-12 LAB — CBC
HCT: 44.1 % (ref 36.0–46.0)
Hemoglobin: 14.2 g/dL (ref 12.0–15.0)
MCH: 31.6 pg (ref 26.0–34.0)
MCHC: 32.2 g/dL (ref 30.0–36.0)
MCV: 98.2 fL (ref 80.0–100.0)
Platelets: 139 10*3/uL — ABNORMAL LOW (ref 150–400)
RBC: 4.49 MIL/uL (ref 3.87–5.11)
RDW: 14.6 % (ref 11.5–15.5)
WBC: 5.1 10*3/uL (ref 4.0–10.5)
nRBC: 0 % (ref 0.0–0.2)

## 2022-06-12 LAB — BASIC METABOLIC PANEL
Anion gap: 5 (ref 5–15)
BUN: 9 mg/dL (ref 6–20)
CO2: 23 mmol/L (ref 22–32)
Calcium: 8.6 mg/dL — ABNORMAL LOW (ref 8.9–10.3)
Chloride: 114 mmol/L — ABNORMAL HIGH (ref 98–111)
Creatinine, Ser: 0.87 mg/dL (ref 0.44–1.00)
GFR, Estimated: >60 mL/min (ref >60)
Glucose, Bld: 93 mg/dL (ref 70–99)
Potassium: 4.4 mmol/L (ref 3.5–5.1)
Sodium: 142 mmol/L (ref 135–145)

## 2022-06-12 LAB — TROPONIN I (HIGH SENSITIVITY): Troponin I (High Sensitivity): 4185 ng/L (ref <18)

## 2022-06-12 MED ORDER — GABAPENTIN 100 MG PO CAPS
100.0000 mg | ORAL_CAPSULE | Freq: Three times a day (TID) | ORAL | Status: DC
  Administered 2022-06-12 – 2022-06-14 (×6): 100 mg via ORAL
  Filled 2022-06-12 (×7): qty 1

## 2022-06-12 MED ORDER — LOSARTAN POTASSIUM 50 MG PO TABS
50.0000 mg | ORAL_TABLET | Freq: Every day | ORAL | Status: DC
  Administered 2022-06-13 – 2022-06-14 (×2): 50 mg via ORAL
  Filled 2022-06-12 (×2): qty 1

## 2022-06-12 MED ORDER — GUAIFENESIN 100 MG/5ML PO LIQD
5.0000 mL | ORAL | Status: DC | PRN
  Administered 2022-06-13 (×2): 5 mL via ORAL
  Filled 2022-06-12 (×2): qty 5

## 2022-06-12 MED ORDER — TIZANIDINE HCL 4 MG PO TABS
2.0000 mg | ORAL_TABLET | Freq: Three times a day (TID) | ORAL | Status: DC
  Administered 2022-06-12 – 2022-06-14 (×6): 2 mg via ORAL
  Filled 2022-06-12 (×6): qty 1

## 2022-06-12 MED ORDER — ORAL CARE MOUTH RINSE: 15.0000 mL | OROMUCOSAL | Status: DC | PRN

## 2022-06-12 MED ORDER — CHLORHEXIDINE GLUCONATE CLOTH 2 % EX PADS
6.0000 | MEDICATED_PAD | Freq: Every day | CUTANEOUS | Status: DC
  Administered 2022-06-11 – 2022-06-14 (×4): 6 via TOPICAL

## 2022-06-12 NOTE — H&P (View-Only) (Signed)
Rounding Note    Patient Name: Selena Clark Date of Encounter: 06/12/2022  [facility] Cardiologist: Gwen Her, MD   Subjective  11/08: 61 y/o female presented with chest pain. EKG with EMS c/with inferior STEMI. Cardiac cath completed expressed total RCA occlusion, 80-90% stenosis in the LAD and left proximal Cx s/p RCA stent with successful revascularization.  Pt denies chest pain, SOB palpitation or dizziness.  Pt appears somnolent other wise no acute visible distress. She is comfortably communicating. Somnolence likely from the gabapentin and muscle relaxant she takes for chronic back pain. Pt in NSR, vitals WNL, On 2L via [place]-O2 96-98% . Lungs CTA. No JVD or pedal edema.  Sons present by the bedside.  Inpatient Medications    Scheduled Meds:  Chlorhexidine Gluconate Cloth  6 each Topical Daily   clopidogrel  75 mg Oral Q breakfast   dicyclomine  20 mg Oral TID   escitalopram  20 mg Oral Daily   gabapentin  100 mg Oral TID   heparin  5,000 Units Subcutaneous Q8H   losartan  50 mg Oral Daily   metoprolol tartrate  25 mg Oral BID   rosuvastatin  40 mg Oral Daily   sodium chloride flush  3 mL Intravenous Q12H   tiZANidine  2 mg Oral TID   topiramate  100 mg Oral Daily   Continuous Infusions:  sodium chloride     PRN Meds: sodium chloride, acetaminophen, ondansetron (ZOFRAN) IV, mouth rinse, sodium chloride flush, traZODone   Vital Signs    Vitals:   06/12/22 0830 06/12/22 0900 06/12/22 0930 06/12/22 1000  BP: 104/74 (!) 107/96 (!) 88/64 94/68  Pulse: 78 77 71 73  Resp: 20 17 16 17   Temp:      TempSrc:      SpO2: 94% 97% 95% 93%  Weight:      Height:        Intake/Output Summary (Last 24 hours) at 06/12/2022 1049 Last data filed at 06/12/2022 0700 Gross per 24 hour  Intake 1243.34 ml  Output 850 ml  Net 393.34 ml      06/12/2022    4:00 AM 06/11/2022    3:34 PM 04/01/2020    2:14 AM  Last 3 Weights  Weight (lbs) 193 lb 9 oz 187 lb 196 lb  3.4 oz  Weight (kg) 87.8 kg 84.823 kg 89 kg      Telemetry    NSR - Personally Reviewed  ECG   Repeat EKG: NSR with non specific ST changes   Physical Exam   GEN: Somnolent, Spontaneously open eyes to voice    Neck: No JVD Cardiac: RRR, no murmurs, rubs, or gallops.  Respiratory: Clear to auscultation bilaterally. GI: Soft, nontender, non-distended  MS: No edema.  Neuro:  Nonfocal  Psych: Normal affect   Labs    High Sensitivity Troponin:   Recent Labs  Lab 06/11/22 1559 06/11/22 1859 06/12/22 0426  TROPONINIHS 7 1,812* 4,185*     Chemistry Recent Labs  Lab 06/11/22 1559 06/11/22 1604 06/12/22 0426  NA 138 139 142  K 3.0* 3.0* 4.4  CL 104 102 114*  CO2 21*  --  23  GLUCOSE 122* 122* 93  BUN 13 12 9   CREATININE 0.91 0.80 0.87  CALCIUM 8.7*  --  8.6*  PROT 5.8*  --   --   ALBUMIN 2.8*  --   --   AST 19  --   --   ALT 10  --   --  ALKPHOS 66  --   --   BILITOT 0.5  --   --   GFRNONAA >60  --  >60  ANIONGAP 13  --  5    Lipids  Recent Labs  Lab 06/11/22 1559  CHOL 200  TRIG 97  HDL 37*  LDLCALC 144*  CHOLHDL 5.4    Hematology Recent Labs  Lab 06/11/22 1559 06/11/22 1604 06/12/22 0426  WBC 5.5  --  5.1  RBC 4.48  --  4.49  HGB 14.1 15.0 14.2  HCT 42.8 44.0 44.1  MCV 95.5  --  98.2  MCH 31.5  --  31.6  MCHC 32.9  --  32.2  RDW 14.2  --  14.6  PLT 154  --  139*   Thyroid No results for input(s): "TSH", "FREET4" in the last 168 hours.  BNP Recent Labs  Lab 06/11/22 2026  BNP 10.9    DDimer No results for input(s): "DDIMER" in the last 168 hours.   Radiology    CARDIAC CATHETERIZATION  Addendum Date: 06/11/2022   CONCLUSIONS: Total occlusion very proximal RCA which is a dominant vessel with TIMI grade 0 flow treated with a 3.5 x 22 drug-eluting stent postdilated to 3.75 mm in diameter to less than 15% stenosis and restoration of TIMI grade III flow.  Right coronary contains moderate luminal irregularities throughout the mid  segment but no high-grade stenosis. Normal left main Severe segmental proximal to mid 85% stenosis in the LAD. Large branching ramus intermedius with 2 severe stenoses. Circumflex with proximal to mid focal 80 to 90% stenosis.  Second obtuse marginal contains 70% stenosis. LV function is normal.  Initial LVEDP was 8 mmHg. Hypotension during the procedure related to RV involvement that improved with reperfusion, volume, and low-dose Levophed which was discontinued prior to discharge from the Cath Lab. RECOMMENDATIONS: We need to determine if the patient requires lifetime Eliquis therapy (for PE).  Last Eliquis was last evening. She was already on Plavix and is allergic to aspirin (hives).  Plan to continue Plavix, will hold Eliquis for now, and make a decision about resumption of anticoagulation with heparin or Eliquis based upon decision concerning left coronary disease within the next 12 to 24 hours. Left coronary disease needs to be treated.  The LAD and circumflex could be easily treated.  The ramus intermedius would be more problematic.  Coronary bypass grafting would likely be a problem in this patient who has had some of her saphenous veins used for lower extremity bypass grafting.   Result Date: 06/11/2022 CONCLUSIONS: Total occlusion very proximal RCA which is a dominant vessel with TIMI grade 0 flow treated with a 3.5 x 22 drug-eluting stent postdilated to 3.75 mm in diameter to less than 15% stenosis and restoration of TIMI grade III flow.  Right coronary contains moderate luminal irregularities throughout the mid segment but no high-grade stenosis. Normal left main Severe segmental proximal to mid 85% stenosis in the LAD. Large branching ramus intermedius with 2 severe stenoses. Proximal to mid focal 80 to 90% stenosis.  Second obtuse marginal contains 70% stenosis. LV function is normal.  Initial LVEDP was 8 mmHg. Hypotension during the procedure related to RV involvement that improved with  reperfusion, volume, and low-dose Levophed which was discontinued prior to discharge from the Cath Lab. RECOMMENDATIONS: We need to determine if the patient requires lifetime Eliquis therapy (for PE).  Last Eliquis was last evening. She was already on Plavix and is allergic to aspirin (hives).  Plan  to continue Plavix, will hold Eliquis for now, and make a decision about resumption of anticoagulation with heparin or Eliquis based upon decision concerning left coronary disease within the next 12 to 24 hours. Left coronary disease needs to be treated.  The LAD and circumflex could be easily treated.  The ramus intermedius would be more problematic.  Coronary bypass grafting would likely be a problem in this patient who has had some of her saphenous veins used for lower extremity bypass grafting.   Cardiac Studies   ECHOCARDIOGRAM REPORT : 03/22/2020  Patient Name:   Selena Clark Date of Exam: 03/22/2020  Medical Rec #:  [id number]        Height:       62.0 in  Accession #:    [id number]       Weight:       175.0 lb  Date of Birth:  [birth year]        BSA:          1.806 m  Patient Age:    59 years         BP:           141/77 mmHg  Patient Gender: F                HR:           104 bpm.  Exam Location:  Inpatient   Procedure: 2D Echo   Indications:    elevated troponin    History:        Patient has no prior history of Echocardiogram  examinations.                 Pulmonary embolism; Signs/Symptoms:Syncope.    Sonographer:    Delcie Roch  Referring Phys: [id number] RAVI PAHWANI   IMPRESSIONS     1. Left ventricular ejection fraction, by estimation, is 60 to 65%. The  left ventricle has normal function. The left ventricle has no regional  wall motion abnormalities. Left ventricular diastolic parameters are  consistent with Grade I diastolic  dysfunction (impaired relaxation).   2. Right ventricular systolic function is normal. The right ventricular  size is normal.   3. The mitral  valve is normal in structure. No evidence of mitral valve  regurgitation. No evidence of mitral stenosis.   4. The aortic valve is tricuspid. Aortic valve regurgitation is not  visualized. No aortic stenosis is present.   5. The inferior vena cava is normal in size with greater than 50%  respiratory variability, suggesting right atrial pressure of 3 mmHg.   FINDINGS   Left Ventricle: Left ventricular ejection fraction, by estimation, is 60  to 65%. The left ventricle has normal function. The left ventricle has no  regional wall motion abnormalities. The left ventricular internal cavity  size was normal in size. There is   no left ventricular hypertrophy. Left ventricular diastolic parameters  are consistent with Grade I diastolic dysfunction (impaired relaxation).   Right Ventricle: The right ventricular size is normal.Right ventricular  systolic function is normal.   Left Atrium: Left atrial size was normal in size.   Right Atrium: Right atrial size was normal in size.   Pericardium: There is no evidence of pericardial effusion.   Mitral Valve: The mitral valve is normal in structure. Normal mobility of  the mitral valve leaflets. No evidence of mitral valve regurgitation. No  evidence of mitral valve stenosis.   Tricuspid Valve: The tricuspid valve is normal in structure.  Tricuspid  valve regurgitation is trivial. No evidence of tricuspid stenosis.   Aortic Valve: The aortic valve is tricuspid. Aortic valve regurgitation is  not visualized. No aortic stenosis is present.   Pulmonic Valve: The pulmonic valve was normal in structure. Pulmonic valve  regurgitation is not visualized. No evidence of pulmonic stenosis.   Aorta: The aortic root is normal in size and structure.   Venous: The inferior vena cava is normal in size with greater than 50%  respiratory variability, suggesting right atrial pressure of 3 mmHg.   IAS/Shunts: No atrial level shunt detected by color flow  Doppler.     LEFT VENTRICLE  PLAX 2D  LVIDd:         4.40 cm Diastology  LVIDs:         2.70 cm LV e' medial:   10.10 cm/s  LV PW:         1.00 cm LV E/e' medial: 8.2  LV IVS:        0.80 cm     RIGHT VENTRICLE  RV S prime:     12.70 cm/s   LEFT ATRIUM             Index       RIGHT ATRIUM           Index  LA diam:        3.00 cm 1.66 cm/m  RA Area:     10.20 cm  LA Vol (A2C):   46.0 ml 25.47 ml/m RA Volume:   20.50 ml  11.35 ml/m  LA Vol (A4C):   32.8 ml 18.16 ml/m  LA Biplane Vol: 39.8 ml 22.04 ml/m   AORTIC VALVE  LVOT Vmax:   124.00 cm/s  LVOT Vmean:  79.600 cm/s  LVOT VTI:    0.201 m    AORTA  Ao Asc diam: 2.80 cm   MV E velocity: 82.70 cm/s  MV A velocity: 114.00 cm/s  SHUNTS  MV E/A ratio:  0.73         Systemic VTI: 0.20 m   Olga Millers MD  Electronically signed by Olga Millers MD  Signature Date/Time: 03/23/2020/3:06:17 PM        Patient Profile     61 y.o. female with H/O hypertension, hyperlipidemia, PVD s/p aorto bifurcation bypass graft, DVT and recurrent  PE's admitted with inferior wall STEMI. Cath showed total occlusion of RCA and proximal to mid LAD and circumflex with proximal to mid focal stenosis 80-90%.  PCI to RCA with was done with successful revascularization.   Assessment & Plan   1) Acute ST elevation MI  # CAD EKG on EMS showed Inferior wall STEMI. Cardiac cath c/w with total RCA occlusion, proximal to mid LAD and left circumflex with proximal to mid 85 to 90% stenosis. PCI to RCA with drug-eluting stent was done with successful revascularization. Pt tolerated procedure well. Denies chest pain or SOB. NSR with SPO2 96-98% on room air. Lungs CTA, no JVD or pedal edema. Detailed discussed with patient, 2 sons and husband (over the phone) for the plan for PCI for tomorrow. Plan -PCI tomorrow for revascularization of LAD and Cx stenosis - Repeat Echo (last echo 03/2022 with EF 60-65%) - Plavix - Heparin 5,000 units Q8H [place] -  Rosuvastatin 40 mg -Metoprolol Tartrate 25 mg BID -Hold Eliquis   -PVD status post L aortofemoral bypass -- Continue Plavix,   -Continue Rosuvastatin   History of PE/DVT On Eliquis 5 mg twice daily, last  dose was on 11/7 -Hold Eliquis -[place] heparin for now   HTN -Start Losartan 50 mg -- Continue metoprolol 25mg BID  -D c'd Hydralazine    HLD: LDL 144 today -- Continue crestor 40mg  Back pain: Pt on gabapentin 400 mg TID  and Tizanidine 4 mg TID. Pt is extremely somnolent, likely form the medications above. Will lower dosage for gabapentin and tizanidine to 100 mg TID and 2 mg TID respectively. -Gabapentin 100 mg  -Tizanidine 2 mg   For questions or updates, please contact [facility] Please consult [email or web address] for contact info under        Signed, Bhupinder Multani, MD  06/12/2022, 10:49 AM     Patient seen, examined. Available data reviewed. Agree with findings, assessment, and plan as outlined by Dr Multani.  The patient is independently interviewed and examined.  Her sons are at the bedside and I spoke with her husband over the phone.  She is somnolent but easily arousable and appropriate in her communication.  HEENT is normal, JVP is normal, lungs are clear bilaterally, heart is regular rate and rhythm no murmur gallop, abdomen soft nontender, right radial cath site is clear, there is no pretibial edema.  She presented yesterday with an acute inferior wall STEMI treated with primary PCI with stenting of the RCA.  She has severe residual disease in the LAD, left circumflex, and ramus intermedius.  We had a shared decision-making conversation today about further management of her obstructive CAD.  With her chronic pain syndrome, history of lower extremity bypass using saphenous vein, and LAD and circumflex stenosis favorable for PCI, I would favor staged PCI of those 2 vessels prior to hospital discharge.  The patient will be continued on clopidogrel because of her need  for chronic oral anticoagulation.  She has had recurrent DVT/PE and will need long-term anticoagulation continue on apixaban.  She is aspirin allergic.  I have reviewed the risks, indications, and alternatives to PTCA and stenting with the patient and her family members today.  Informed consent is obtained.  We will keep in the CV-ICU today and plan to transfer her to a cardiac telemetry bed following PCI tomorrow.  Otherwise as outlined above.  Chrishauna Mee, M.D. 06/12/2022 11:01 AM  

## 2022-06-12 NOTE — Progress Notes (Signed)
  Echocardiogram 2D Echocardiogram has been performed.  Selena Clark 06/12/2022, 12:38 PM

## 2022-06-12 NOTE — Progress Notes (Addendum)
Rounding Note    Patient Name: Selena Clark Date of Encounter: 06/12/2022  [facility] Cardiologist: Gwen Her, MD   Subjective  11/08: 61 y/o female presented with chest pain. EKG with EMS c/with inferior STEMI. Cardiac cath completed expressed total RCA occlusion, 80-90% stenosis in the LAD and left proximal Cx s/p RCA stent with successful revascularization.  Pt denies chest pain, SOB palpitation or dizziness.  Pt appears somnolent other wise no acute visible distress. She is comfortably communicating. Somnolence likely from the gabapentin and muscle relaxant she takes for chronic back pain. Pt in NSR, vitals WNL, On 2L via [place]-O2 96-98% . Lungs CTA. No JVD or pedal edema.  Sons present by the bedside.  Inpatient Medications    Scheduled Meds:  Chlorhexidine Gluconate Cloth  6 each Topical Daily   clopidogrel  75 mg Oral Q breakfast   dicyclomine  20 mg Oral TID   escitalopram  20 mg Oral Daily   gabapentin  100 mg Oral TID   heparin  5,000 Units Subcutaneous Q8H   losartan  50 mg Oral Daily   metoprolol tartrate  25 mg Oral BID   rosuvastatin  40 mg Oral Daily   sodium chloride flush  3 mL Intravenous Q12H   tiZANidine  2 mg Oral TID   topiramate  100 mg Oral Daily   Continuous Infusions:  sodium chloride     PRN Meds: sodium chloride, acetaminophen, ondansetron (ZOFRAN) IV, mouth rinse, sodium chloride flush, traZODone   Vital Signs    Vitals:   06/12/22 0830 06/12/22 0900 06/12/22 0930 06/12/22 1000  BP: 104/74 (!) 107/96 (!) 88/64 94/68  Pulse: 78 77 71 73  Resp: 20 17 16 17   Temp:      TempSrc:      SpO2: 94% 97% 95% 93%  Weight:      Height:        Intake/Output Summary (Last 24 hours) at 06/12/2022 1049 Last data filed at 06/12/2022 0700 Gross per 24 hour  Intake 1243.34 ml  Output 850 ml  Net 393.34 ml      06/12/2022    4:00 AM 06/11/2022    3:34 PM 04/01/2020    2:14 AM  Last 3 Weights  Weight (lbs) 193 lb 9 oz 187 lb 196 lb  3.4 oz  Weight (kg) 87.8 kg 84.823 kg 89 kg      Telemetry    NSR - Personally Reviewed  ECG   Repeat EKG: NSR with non specific ST changes   Physical Exam   GEN: Somnolent, Spontaneously open eyes to voice    Neck: No JVD Cardiac: RRR, no murmurs, rubs, or gallops.  Respiratory: Clear to auscultation bilaterally. GI: Soft, nontender, non-distended  MS: No edema.  Neuro:  Nonfocal  Psych: Normal affect   Labs    High Sensitivity Troponin:   Recent Labs  Lab 06/11/22 1559 06/11/22 1859 06/12/22 0426  TROPONINIHS 7 1,812* 4,185*     Chemistry Recent Labs  Lab 06/11/22 1559 06/11/22 1604 06/12/22 0426  NA 138 139 142  K 3.0* 3.0* 4.4  CL 104 102 114*  CO2 21*  --  23  GLUCOSE 122* 122* 93  BUN 13 12 9   CREATININE 0.91 0.80 0.87  CALCIUM 8.7*  --  8.6*  PROT 5.8*  --   --   ALBUMIN 2.8*  --   --   AST 19  --   --   ALT 10  --   --  ALKPHOS 66  --   --   BILITOT 0.5  --   --   GFRNONAA >60  --  >60  ANIONGAP 13  --  5    Lipids  Recent Labs  Lab 06/11/22 1559  CHOL 200  TRIG 97  HDL 37*  LDLCALC 144*  CHOLHDL 5.4    Hematology Recent Labs  Lab 06/11/22 1559 06/11/22 1604 06/12/22 0426  WBC 5.5  --  5.1  RBC 4.48  --  4.49  HGB 14.1 15.0 14.2  HCT 42.8 44.0 44.1  MCV 95.5  --  98.2  MCH 31.5  --  31.6  MCHC 32.9  --  32.2  RDW 14.2  --  14.6  PLT 154  --  139*   Thyroid No results for input(s): "TSH", "FREET4" in the last 168 hours.  BNP Recent Labs  Lab 06/11/22 2026  BNP 10.9    DDimer No results for input(s): "DDIMER" in the last 168 hours.   Radiology    CARDIAC CATHETERIZATION  Addendum Date: 06/11/2022   CONCLUSIONS: Total occlusion very proximal RCA which is a dominant vessel with TIMI grade 0 flow treated with a 3.5 x 22 drug-eluting stent postdilated to 3.75 mm in diameter to less than 15% stenosis and restoration of TIMI grade III flow.  Right coronary contains moderate luminal irregularities throughout the mid  segment but no high-grade stenosis. Normal left main Severe segmental proximal to mid 85% stenosis in the LAD. Large branching ramus intermedius with 2 severe stenoses. Circumflex with proximal to mid focal 80 to 90% stenosis.  Second obtuse marginal contains 70% stenosis. LV function is normal.  Initial LVEDP was 8 mmHg. Hypotension during the procedure related to RV involvement that improved with reperfusion, volume, and low-dose Levophed which was discontinued prior to discharge from the Cath Lab. RECOMMENDATIONS: We need to determine if the patient requires lifetime Eliquis therapy (for PE).  Last Eliquis was last evening. She was already on Plavix and is allergic to aspirin (hives).  Plan to continue Plavix, will hold Eliquis for now, and make a decision about resumption of anticoagulation with heparin or Eliquis based upon decision concerning left coronary disease within the next 12 to 24 hours. Left coronary disease needs to be treated.  The LAD and circumflex could be easily treated.  The ramus intermedius would be more problematic.  Coronary bypass grafting would likely be a problem in this patient who has had some of her saphenous veins used for lower extremity bypass grafting.   Result Date: 06/11/2022 CONCLUSIONS: Total occlusion very proximal RCA which is a dominant vessel with TIMI grade 0 flow treated with a 3.5 x 22 drug-eluting stent postdilated to 3.75 mm in diameter to less than 15% stenosis and restoration of TIMI grade III flow.  Right coronary contains moderate luminal irregularities throughout the mid segment but no high-grade stenosis. Normal left main Severe segmental proximal to mid 85% stenosis in the LAD. Large branching ramus intermedius with 2 severe stenoses. Proximal to mid focal 80 to 90% stenosis.  Second obtuse marginal contains 70% stenosis. LV function is normal.  Initial LVEDP was 8 mmHg. Hypotension during the procedure related to RV involvement that improved with  reperfusion, volume, and low-dose Levophed which was discontinued prior to discharge from the Cath Lab. RECOMMENDATIONS: We need to determine if the patient requires lifetime Eliquis therapy (for PE).  Last Eliquis was last evening. She was already on Plavix and is allergic to aspirin (hives).  Plan  to continue Plavix, will hold Eliquis for now, and make a decision about resumption of anticoagulation with heparin or Eliquis based upon decision concerning left coronary disease within the next 12 to 24 hours. Left coronary disease needs to be treated.  The LAD and circumflex could be easily treated.  The ramus intermedius would be more problematic.  Coronary bypass grafting would likely be a problem in this patient who has had some of her saphenous veins used for lower extremity bypass grafting.   Cardiac Studies   ECHOCARDIOGRAM REPORT : 03/22/2020  Patient Name:   Selena Clark Date of Exam: 03/22/2020  Medical Rec #:  [id number]        Height:       62.0 in  Accession #:    [id number]       Weight:       175.0 lb  Date of Birth:  [birth year]        BSA:          1.806 m  Patient Age:    59 years         BP:           141/77 mmHg  Patient Gender: F                HR:           104 bpm.  Exam Location:  Inpatient   Procedure: 2D Echo   Indications:    elevated troponin    History:        Patient has no prior history of Echocardiogram  examinations.                 Pulmonary embolism; Signs/Symptoms:Syncope.    Sonographer:    Delcie Roch  Referring Phys: [id number] RAVI PAHWANI   IMPRESSIONS     1. Left ventricular ejection fraction, by estimation, is 60 to 65%. The  left ventricle has normal function. The left ventricle has no regional  wall motion abnormalities. Left ventricular diastolic parameters are  consistent with Grade I diastolic  dysfunction (impaired relaxation).   2. Right ventricular systolic function is normal. The right ventricular  size is normal.   3. The mitral  valve is normal in structure. No evidence of mitral valve  regurgitation. No evidence of mitral stenosis.   4. The aortic valve is tricuspid. Aortic valve regurgitation is not  visualized. No aortic stenosis is present.   5. The inferior vena cava is normal in size with greater than 50%  respiratory variability, suggesting right atrial pressure of 3 mmHg.   FINDINGS   Left Ventricle: Left ventricular ejection fraction, by estimation, is 60  to 65%. The left ventricle has normal function. The left ventricle has no  regional wall motion abnormalities. The left ventricular internal cavity  size was normal in size. There is   no left ventricular hypertrophy. Left ventricular diastolic parameters  are consistent with Grade I diastolic dysfunction (impaired relaxation).   Right Ventricle: The right ventricular size is normal.Right ventricular  systolic function is normal.   Left Atrium: Left atrial size was normal in size.   Right Atrium: Right atrial size was normal in size.   Pericardium: There is no evidence of pericardial effusion.   Mitral Valve: The mitral valve is normal in structure. Normal mobility of  the mitral valve leaflets. No evidence of mitral valve regurgitation. No  evidence of mitral valve stenosis.   Tricuspid Valve: The tricuspid valve is normal in structure.  Tricuspid  valve regurgitation is trivial. No evidence of tricuspid stenosis.   Aortic Valve: The aortic valve is tricuspid. Aortic valve regurgitation is  not visualized. No aortic stenosis is present.   Pulmonic Valve: The pulmonic valve was normal in structure. Pulmonic valve  regurgitation is not visualized. No evidence of pulmonic stenosis.   Aorta: The aortic root is normal in size and structure.   Venous: The inferior vena cava is normal in size with greater than 50%  respiratory variability, suggesting right atrial pressure of 3 mmHg.   IAS/Shunts: No atrial level shunt detected by color flow  Doppler.     LEFT VENTRICLE  PLAX 2D  LVIDd:         4.40 cm Diastology  LVIDs:         2.70 cm LV e' medial:   10.10 cm/s  LV PW:         1.00 cm LV E/e' medial: 8.2  LV IVS:        0.80 cm     RIGHT VENTRICLE  RV S prime:     12.70 cm/s   LEFT ATRIUM             Index       RIGHT ATRIUM           Index  LA diam:        3.00 cm 1.66 cm/m  RA Area:     10.20 cm  LA Vol (A2C):   46.0 ml 25.47 ml/m RA Volume:   20.50 ml  11.35 ml/m  LA Vol (A4C):   32.8 ml 18.16 ml/m  LA Biplane Vol: 39.8 ml 22.04 ml/m   AORTIC VALVE  LVOT Vmax:   124.00 cm/s  LVOT Vmean:  79.600 cm/s  LVOT VTI:    0.201 m    AORTA  Ao Asc diam: 2.80 cm   MV E velocity: 82.70 cm/s  MV A velocity: 114.00 cm/s  SHUNTS  MV E/A ratio:  0.73         Systemic VTI: 0.20 m   Olga Millers MD  Electronically signed by Olga Millers MD  Signature Date/Time: 03/23/2020/3:06:17 PM        Patient Profile     61 y.o. female with H/O hypertension, hyperlipidemia, PVD s/p aorto bifurcation bypass graft, DVT and recurrent  PE's admitted with inferior wall STEMI. Cath showed total occlusion of RCA and proximal to mid LAD and circumflex with proximal to mid focal stenosis 80-90%.  PCI to RCA with was done with successful revascularization.   Assessment & Plan   1) Acute ST elevation MI  # CAD EKG on EMS showed Inferior wall STEMI. Cardiac cath c/w with total RCA occlusion, proximal to mid LAD and left circumflex with proximal to mid 85 to 90% stenosis. PCI to RCA with drug-eluting stent was done with successful revascularization. Pt tolerated procedure well. Denies chest pain or SOB. NSR with SPO2 96-98% on room air. Lungs CTA, no JVD or pedal edema. Detailed discussed with patient, 2 sons and husband (over the phone) for the plan for PCI for tomorrow. Plan -PCI tomorrow for revascularization of LAD and Cx stenosis - Repeat Echo (last echo 03/2022 with EF 60-65%) - Plavix - Heparin 5,000 units Q8H [place] -  Rosuvastatin 40 mg -Metoprolol Tartrate 25 mg BID -Hold Eliquis   -PVD status post L aortofemoral bypass -- Continue Plavix,   -Continue Rosuvastatin   History of PE/DVT On Eliquis 5 mg twice daily, last  dose was on 11/7 -Hold Eliquis -[place] heparin for now   HTN -Start Losartan 50 mg -- Continue metoprolol 25mg  BID  -D c'd Hydralazine    HLD: LDL 144 today -- Continue crestor 40mg   Back pain: Pt on gabapentin 400 mg TID  and Tizanidine 4 mg TID. Pt is extremely somnolent, likely form the medications above. Will lower dosage for gabapentin and tizanidine to 100 mg TID and 2 mg TID respectively. -Gabapentin 100 mg  -Tizanidine 2 mg   For questions or updates, please contact [facility] Please consult [email or web address] for contact info under        Signed, Reinaldo RaddleBhupinder Multani, MD  06/12/2022, 10:49 AM     Patient seen, examined. Available data reviewed. Agree with findings, assessment, and plan as outlined by Dr Nicky PughMultani.  The patient is independently interviewed and examined.  Her sons are at the bedside and I spoke with her husband over the phone.  She is somnolent but easily arousable and appropriate in her communication.  HEENT is normal, JVP is normal, lungs are clear bilaterally, heart is regular rate and rhythm no murmur gallop, abdomen soft nontender, right radial cath site is clear, there is no pretibial edema.  She presented yesterday with an acute inferior wall STEMI treated with primary PCI with stenting of the RCA.  She has severe residual disease in the LAD, left circumflex, and ramus intermedius.  We had a shared decision-making conversation today about further management of her obstructive CAD.  With her chronic pain syndrome, history of lower extremity bypass using saphenous vein, and LAD and circumflex stenosis favorable for PCI, I would favor staged PCI of those 2 vessels prior to hospital discharge.  The patient will be continued on clopidogrel because of her need  for chronic oral anticoagulation.  She has had recurrent DVT/PE and will need long-term anticoagulation continue on apixaban.  She is aspirin allergic.  I have reviewed the risks, indications, and alternatives to PTCA and stenting with the patient and her family members today.  Informed consent is obtained.  We will keep in the CV-ICU today and plan to transfer her to a cardiac telemetry bed following PCI tomorrow.  Otherwise as outlined above.  Tonny BollmanMichael Mahalie Kanner, M.D. 06/12/2022 11:01 AM

## 2022-06-13 ENCOUNTER — Encounter (HOSPITAL_COMMUNITY)
Admission: EM | Disposition: A | Discharge status: Home / Self Care | Payer: Self-pay | Source: Ambulatory Visit | Attending: Interventional Cardiology

## 2022-06-13 ENCOUNTER — Encounter (HOSPITAL_COMMUNITY): Payer: Self-pay | Admitting: Cardiovascular Disease

## 2022-06-13 DIAGNOSIS — J069 Acute upper respiratory infection, unspecified: Secondary | ICD-10-CM

## 2022-06-13 DIAGNOSIS — I200 Unstable angina: Secondary | ICD-10-CM | POA: Diagnosis not present

## 2022-06-13 DIAGNOSIS — I2119 ST elevation (STEMI) myocardial infarction involving other coronary artery of inferior wall: Secondary | ICD-10-CM | POA: Diagnosis not present

## 2022-06-13 HISTORY — PX: CORONARY STENT INTERVENTION: CATH118234

## 2022-06-13 LAB — BASIC METABOLIC PANEL
Anion gap: 4 — ABNORMAL LOW (ref 5–15)
BUN: 11 mg/dL (ref 6–20)
CO2: 27 mmol/L (ref 22–32)
Calcium: 8.8 mg/dL — ABNORMAL LOW (ref 8.9–10.3)
Chloride: 107 mmol/L (ref 98–111)
Creatinine, Ser: 0.99 mg/dL (ref 0.44–1.00)
GFR, Estimated: >60 mL/min (ref >60)
Glucose, Bld: 84 mg/dL (ref 70–99)
Potassium: 3.5 mmol/L (ref 3.5–5.1)
Sodium: 138 mmol/L (ref 135–145)

## 2022-06-13 LAB — CBC
HCT: 44.6 % (ref 36.0–46.0)
Hemoglobin: 14.1 g/dL (ref 12.0–15.0)
MCH: 31.3 pg (ref 26.0–34.0)
MCHC: 31.6 g/dL (ref 30.0–36.0)
MCV: 99.1 fL (ref 80.0–100.0)
Platelets: 163 10*3/uL (ref 150–400)
RBC: 4.5 MIL/uL (ref 3.87–5.11)
RDW: 14.2 % (ref 11.5–15.5)
WBC: 5 10*3/uL (ref 4.0–10.5)
nRBC: 0 % (ref 0.0–0.2)

## 2022-06-13 LAB — LIPOPROTEIN A (LPA): Lipoprotein (a): 92.9 nmol/L — ABNORMAL HIGH (ref <75.0)

## 2022-06-13 LAB — POCT ACTIVATED CLOTTING TIME: Activated Clotting Time: 444 seconds

## 2022-06-13 SURGERY — CORONARY STENT INTERVENTION
Anesthesia: LOCAL

## 2022-06-13 MED ORDER — SODIUM CHLORIDE 0.9 % IV SOLN: INTRAVENOUS | Status: AC

## 2022-06-13 MED ORDER — VERAPAMIL HCL 2.5 MG/ML IV SOLN
INTRAVENOUS | Status: AC
  Filled 2022-06-13: qty 2

## 2022-06-13 MED ORDER — IOHEXOL 350 MG/ML SOLN
INTRAVENOUS | Status: DC | PRN
  Administered 2022-06-13: 130 mL

## 2022-06-13 MED ORDER — NITROGLYCERIN 1 MG/10 ML FOR IR/CATH LAB
INTRA_ARTERIAL | Status: DC | PRN
  Administered 2022-06-13: 200 ug via INTRACORONARY

## 2022-06-13 MED ORDER — LIDOCAINE HCL (PF) 1 % IJ SOLN
INTRAMUSCULAR | Status: DC | PRN
  Administered 2022-06-13: 2 mL

## 2022-06-13 MED ORDER — HEPARIN SODIUM (PORCINE) 1000 UNIT/ML IJ SOLN
INTRAMUSCULAR | Status: DC | PRN
  Administered 2022-06-13: 10000 [IU] via INTRAVENOUS

## 2022-06-13 MED ORDER — SODIUM CHLORIDE 0.9 % WEIGHT BASED INFUSION: 3.0000 mL/kg/h | INTRAVENOUS | Status: DC

## 2022-06-13 MED ORDER — HEPARIN (PORCINE) IN NACL 1000-0.9 UT/500ML-% IV SOLN
INTRAVENOUS | Status: DC | PRN
  Administered 2022-06-13 (×2): 500 mL

## 2022-06-13 MED ORDER — HEPARIN (PORCINE) IN NACL 1000-0.9 UT/500ML-% IV SOLN
INTRAVENOUS | Status: AC
  Filled 2022-06-13: qty 500

## 2022-06-13 MED ORDER — SODIUM CHLORIDE 0.9 % IV SOLN: 250.0000 mL | INTRAVENOUS | Status: DC | PRN

## 2022-06-13 MED ORDER — NITROGLYCERIN 1 MG/10 ML FOR IR/CATH LAB
INTRA_ARTERIAL | Status: AC
  Filled 2022-06-13: qty 10

## 2022-06-13 MED ORDER — FENTANYL CITRATE (PF) 100 MCG/2ML IJ SOLN
INTRAMUSCULAR | Status: DC | PRN
  Administered 2022-06-13 (×2): 25 ug via INTRAVENOUS

## 2022-06-13 MED ORDER — HEPARIN SODIUM (PORCINE) 1000 UNIT/ML IJ SOLN
INTRAMUSCULAR | Status: AC
  Filled 2022-06-13: qty 10

## 2022-06-13 MED ORDER — BENZONATATE 100 MG PO CAPS
100.0000 mg | ORAL_CAPSULE | Freq: Three times a day (TID) | ORAL | Status: DC | PRN
  Administered 2022-06-13 – 2022-06-14 (×3): 100 mg via ORAL
  Filled 2022-06-13 (×3): qty 1

## 2022-06-13 MED ORDER — VERAPAMIL HCL 2.5 MG/ML IV SOLN
INTRAVENOUS | Status: DC | PRN
  Administered 2022-06-13: 10 mL via INTRA_ARTERIAL

## 2022-06-13 MED ORDER — SODIUM CHLORIDE 0.9 % WEIGHT BASED INFUSION
1.0000 mL/kg/h | INTRAVENOUS | Status: DC
  Administered 2022-06-13: 1 mL/kg/h via INTRAVENOUS

## 2022-06-13 MED ORDER — MIDAZOLAM HCL 2 MG/2ML IJ SOLN
INTRAMUSCULAR | Status: DC | PRN
  Administered 2022-06-13 (×2): 1 mg via INTRAVENOUS

## 2022-06-13 MED ORDER — LABETALOL HCL 5 MG/ML IV SOLN: 10.0000 mg | INTRAVENOUS | Status: AC | PRN

## 2022-06-13 MED ORDER — SODIUM CHLORIDE 0.9% FLUSH: 3.0000 mL | INTRAVENOUS | Status: DC | PRN

## 2022-06-13 MED ORDER — LIDOCAINE HCL (PF) 1 % IJ SOLN
INTRAMUSCULAR | Status: AC
  Filled 2022-06-13: qty 30

## 2022-06-13 MED ORDER — SODIUM CHLORIDE 0.9% FLUSH
3.0000 mL | Freq: Two times a day (BID) | INTRAVENOUS | Status: DC
  Administered 2022-06-13 – 2022-06-14 (×2): 3 mL via INTRAVENOUS

## 2022-06-13 MED ORDER — HEPARIN SODIUM (PORCINE) 5000 UNIT/ML IJ SOLN
5000.0000 [IU] | Freq: Three times a day (TID) | INTRAMUSCULAR | Status: DC
  Filled 2022-06-13: qty 1

## 2022-06-13 MED ORDER — SODIUM CHLORIDE 0.9% FLUSH
3.0000 mL | Freq: Two times a day (BID) | INTRAVENOUS | Status: DC
  Administered 2022-06-13: 3 mL via INTRAVENOUS

## 2022-06-13 MED ORDER — DIPHENHYDRAMINE HCL 25 MG PO CAPS
25.0000 mg | ORAL_CAPSULE | Freq: Four times a day (QID) | ORAL | Status: DC | PRN
  Administered 2022-06-13: 25 mg via ORAL
  Filled 2022-06-13: qty 1

## 2022-06-13 MED ORDER — MIDAZOLAM HCL 2 MG/2ML IJ SOLN
INTRAMUSCULAR | Status: AC
  Filled 2022-06-13: qty 2

## 2022-06-13 MED ORDER — FENTANYL CITRATE (PF) 100 MCG/2ML IJ SOLN
INTRAMUSCULAR | Status: AC
  Filled 2022-06-13: qty 2

## 2022-06-13 MED ORDER — HYDRALAZINE HCL 20 MG/ML IJ SOLN: 10.0000 mg | INTRAMUSCULAR | Status: AC | PRN

## 2022-06-13 SURGICAL SUPPLY — 20 items
BALL SAPPHIRE NC24 2.75X8 (BALLOONS) ×1
BALL SAPPHIRE NC24 3.25X12 (BALLOONS) ×1
BALLN EMERGE MR 2.0X8 (BALLOONS) ×1
BALLOON EMERGE MR 2.0X8 (BALLOONS) IMPLANT
BALLOON SAPPHIRE NC24 2.75X8 (BALLOONS) IMPLANT
BALLOON SAPPHIRE NC24 3.25X12 (BALLOONS) IMPLANT
BAND ZEPHYR COMPRESS 30 LONG (HEMOSTASIS) IMPLANT
CATH VISTA GUIDE 6FR XB3 (CATHETERS) IMPLANT
ELECT DEFIB PAD ADLT CADENCE (PAD) IMPLANT
GLIDESHEATH SLEND SS 6F .021 (SHEATH) IMPLANT
GUIDEWIRE INQWIRE 1.5J.035X260 (WIRE) IMPLANT
INQWIRE 1.5J .035X260CM (WIRE) ×1
KIT ENCORE 26 ADVANTAGE (KITS) IMPLANT
KIT HEART LEFT (KITS) ×1 IMPLANT
PACK CARDIAC CATHETERIZATION (CUSTOM PROCEDURE TRAY) ×1 IMPLANT
STENT ONYX FRONTIER 2.5X12 (Permanent Stent) IMPLANT
STENT ONYX FRONTIER 3.0X15 (Permanent Stent) IMPLANT
TRANSDUCER W/STOPCOCK (MISCELLANEOUS) ×1 IMPLANT
TUBING CIL FLEX 10 FLL-RA (TUBING) ×1 IMPLANT
WIRE COUGAR XT STRL 190CM (WIRE) IMPLANT

## 2022-06-13 NOTE — Progress Notes (Signed)
CARDIAC REHAB PHASE I     Pt sitting in chair feeling well today.  Post Stent/MI education including antiplatelet therapy importance, site care, heart healthy diet, smoking cessation, risk factors, restrictions, MI booklet, exercise guidelines and CRP2 reviewed. All questions and concerns addressed. Will refer to [facility] for CRP2. Will continue to follow.   [id number] Woodroe Chen, RN BSN 06/13/2022 8:55 AM

## 2022-06-13 NOTE — Progress Notes (Signed)
Unable to sleep due to cough and allergies. Gave Robitussin @ 0031 w/out relief. States she takes benadryl and tessalon at home and usually has relief .  Contacted on call Dr Elayne Guerin . See new PRN orders.

## 2022-06-13 NOTE — Care Management (Signed)
  Transition of Care [facility]) Screening Note   Patient Details  Name: Selena Clark Date of Birth: 05/30/1961   [facility] CM/SW Contact:    Gala Lewandowsky, RN Phone Number: 06/13/2022, 3:38 PM    Transition of Care Department [facility]) has reviewed the patient and no TOC needs have been identified at this time. We will continue to monitor patient advancement through interdisciplinary progression rounds. If new patient transition needs arise, please place a TOC consult.

## 2022-06-13 NOTE — Progress Notes (Addendum)
Rounding Note    Patient Name: Selena Clark Date of Encounter: 06/13/2022  [facility] Cardiologist: Gwen Her, MD   Subjective  11/09: Selena Clark is 61 y/o female with nonobstructive CAD, PVD s/p aorto bifurcation bypass graft, HTN, HLD, PE, on Eliqui,  tobacco use who is being seen for the evaluation of chest pain/STEMI.   Status post PCI with RCA stent with successful revascularization. Patient is doing fairly well post procedure. She denies chest pain, SOB, palpitation, dizziness, PND or orthopnea. She has severe residual disease in the LAD, left circumflex. Plan for PCI with stenting of LAD and left circumflex.  Patient's systolic blood pressure was soft yesterday, systolic between 56-213 however patient remained asymptomatic.  Blood pressure medications including losartan and metoprolol were held.  Today her blood pressure noted to be 141/95, SPO2 96% on 2 L with heart rate of 67. Telemetry reflected normal sinus rhythm. She should be Ok to get her antihypertensive medications prior to the procedure.  Patient continued to be somnolent however she is easily arousable to voice.  She is comfortably communicating without visible distress.  Patient complained of dry, nonproductive cough, clear nasal drainage and postnasal drip which started yesterday.  Patient reports cough prevented her from falling asleep.  Patient denies fever, chills, shortness of breath.  She denies sick contact.   Inpatient Medications    Scheduled Meds:  Chlorhexidine Gluconate Cloth  6 each Topical Daily   clopidogrel  75 mg Oral Q breakfast   dicyclomine  20 mg Oral TID   escitalopram  20 mg Oral Daily   gabapentin  100 mg Oral TID   heparin  5,000 Units Subcutaneous Q8H   losartan  50 mg Oral Daily   metoprolol tartrate  25 mg Oral BID   rosuvastatin  40 mg Oral Daily   sodium chloride flush  3 mL Intravenous Q12H   sodium chloride flush  3 mL Intravenous Q12H   tiZANidine  2 mg  Oral TID   topiramate  100 mg Oral Daily   Continuous Infusions:  sodium chloride     sodium chloride     sodium chloride 1 mL/kg/hr (06/13/22 0837)   PRN Meds: sodium chloride, sodium chloride, acetaminophen, benzonatate, diphenhydrAMINE, guaiFENesin, ondansetron (ZOFRAN) IV, mouth rinse, sodium chloride flush, sodium chloride flush, traZODone   Vital Signs    Vitals:   06/13/22 0400 06/13/22 0500 06/13/22 0600 06/13/22 0700  BP: 117/78 (!) 88/64 124/77 (!) 141/95  Pulse: 63 65 65 68  Resp: 14 16 15 16   Temp:   98.2 F (36.8 C)   TempSrc:   Oral   SpO2: 98% 94% 94% 92%  Weight:      Height:        Intake/Output Summary (Last 24 hours) at 06/13/2022 0844 Last data filed at 06/13/2022 0300 Gross per 24 hour  Intake --  Output 800 ml  Net -800 ml      06/12/2022    4:00 AM 06/11/2022    3:34 PM 04/01/2020    2:14 AM  Last 3 Weights  Weight (lbs) 193 lb 9 oz 187 lb 196 lb 3.4 oz  Weight (kg) 87.8 kg 84.823 kg 89 kg      Telemetry    NSR, HR 67. No ST or T wave changes appreciated - Personally Reviewed  ECG    NSR, No ST or T wave changes appreciated- Personally Reviewed  Physical Exam   GEN: No acute distress.   Neck: No  JVD HEENT: No pharyngeal erythema, lesions or exudates appreciated. Trace clear nasal drainage. Cardiac: RRR, no murmurs, rubs, or gallops.  Respiratory: Clear to auscultation bilaterally.No R/R or wheeze appreciated. GI: Soft, nontender, non-distended  Selena: No edema; No deformity. Neuro:  Nonfocal  Psych: Normal affect   Labs    High Sensitivity Troponin:   Recent Labs  Lab 06/11/22 1559 06/11/22 1859 06/12/22 0426  TROPONINIHS 7 1,812* 4,185*     Chemistry Recent Labs  Lab 06/11/22 1559 06/11/22 1604 06/12/22 0426 06/13/22 0632  NA 138 139 142 138  K 3.0* 3.0* 4.4 3.5  CL 104 102 114* 107  CO2 21*  --  23 27  GLUCOSE 122* 122* 93 84  BUN 13 12 9 11   CREATININE 0.91 0.80 0.87 0.99  CALCIUM 8.7*  --  8.6* 8.8*  PROT 5.8*   --   --   --   ALBUMIN 2.8*  --   --   --   AST 19  --   --   --   ALT 10  --   --   --   ALKPHOS 66  --   --   --   BILITOT 0.5  --   --   --   GFRNONAA >60  --  >60 >60  ANIONGAP 13  --  5 4*    Lipids  Recent Labs  Lab 06/11/22 1559  CHOL 200  TRIG 97  HDL 37*  LDLCALC 144*  CHOLHDL 5.4    Hematology Recent Labs  Lab 06/11/22 1559 06/11/22 1604 06/12/22 0426 06/13/22 0632  WBC 5.5  --  5.1 5.0  RBC 4.48  --  4.49 4.50  HGB 14.1 15.0 14.2 14.1  HCT 42.8 44.0 44.1 44.6  MCV 95.5  --  98.2 99.1  MCH 31.5  --  31.6 31.3  MCHC 32.9  --  32.2 31.6  RDW 14.2  --  14.6 14.2  PLT 154  --  139* 163   Thyroid No results for input(s): "TSH", "FREET4" in the last 168 hours.  BNP Recent Labs  Lab 06/11/22 2026  BNP 10.9    DDimer No results for input(s): "DDIMER" in the last 168 hours.   Radiology    ECHOCARDIOGRAM COMPLETE  Result Date: 06/12/2022    ECHOCARDIOGRAM REPORT   Patient Name:   Selena Clark Date of Exam: 06/12/2022 Medical Rec #:  13/03/2022        Height:       62.5 in Accession #:    [id number]       Weight:       193.6 lb Date of Birth:  Oct 09, 1960        BSA:          1.897 m Patient Age:    61 years         BP:           95/29 mmHg Patient Gender: F                HR:           75 bpm. Exam Location:  Inpatient Procedure: 2D Echo, Color Doppler and Cardiac Doppler Indications:    Myocardial Infact I21,9  History:        Patient has prior history of Echocardiogram examinations, most                 recent 03/22/2020. STEMI, PAD, Signs/Symptoms:Chest Pain; Risk  Factors:Current Smoker and Dyslipidemia.  Sonographer:    Aron Baba Referring Phys: [phone] Barry Dienes [facility]  Sonographer Comments: Suboptimal subcostal window and suboptimal parasternal window. Image acquisition challenging due to patient body habitus and Image acquisition challenging due to respiratory motion. IMPRESSIONS  1. Left ventricular ejection fraction, by estimation, is 55 to 60%.  The left ventricle has normal function. The left ventricle demonstrates regional wall motion abnormalities with basal inferolateral hypokinesis. Left ventricular diastolic parameters are consistent with Grade I diastolic dysfunction (impaired relaxation).  2. Right ventricular systolic function is mildly reduced. The right ventricular size is mildly enlarged. There is normal pulmonary artery systolic pressure. The estimated right ventricular systolic pressure is 12.9 mmHg.  3. The mitral valve is normal in structure. No evidence of mitral valve regurgitation. No evidence of mitral stenosis.  4. The aortic valve is tricuspid. Aortic valve regurgitation is not visualized. No aortic stenosis is present.  5. The inferior vena cava is normal in size with greater than 50% respiratory variability, suggesting right atrial pressure of 3 mmHg. FINDINGS  Left Ventricle: Left ventricular ejection fraction, by estimation, is 55 to 60%. The left ventricle has normal function. The left ventricle demonstrates regional wall motion abnormalities. The left ventricular internal cavity size was normal in size. There is no left ventricular hypertrophy. Left ventricular diastolic parameters are consistent with Grade I diastolic dysfunction (impaired relaxation). Right Ventricle: The right ventricular size is mildly enlarged. No increase in right ventricular wall thickness. Right ventricular systolic function is mildly reduced. There is normal pulmonary artery systolic pressure. The tricuspid regurgitant velocity  is 1.57 m/s, and with an assumed right atrial pressure of 3 mmHg, the estimated right ventricular systolic pressure is 12.9 mmHg. Left Atrium: Left atrial size was normal in size. Right Atrium: Right atrial size was normal in size. Pericardium: There is no evidence of pericardial effusion. Mitral Valve: The mitral valve is normal in structure. No evidence of mitral valve regurgitation. No evidence of mitral valve stenosis.  Tricuspid Valve: The tricuspid valve is normal in structure. Tricuspid valve regurgitation is trivial. Aortic Valve: The aortic valve is tricuspid. Aortic valve regurgitation is not visualized. No aortic stenosis is present. Pulmonic Valve: The pulmonic valve was normal in structure. Pulmonic valve regurgitation is not visualized. Aorta: The aortic root is normal in size and structure. Venous: The inferior vena cava is normal in size with greater than 50% respiratory variability, suggesting right atrial pressure of 3 mmHg. IAS/Shunts: No atrial level shunt detected by color flow Doppler.  LEFT VENTRICLE PLAX 2D LVIDd:         3.90 cm   Diastology LVIDs:         2.80 cm   LV e' medial:    10.20 cm/s LV PW:         1.00 cm   LV E/e' medial:  8.0 LV IVS:        0.80 cm   LV e' lateral:   11.00 cm/s LVOT diam:     2.00 cm   LV E/e' lateral: 7.5 LV SV:         78 LV SV Index:   41 LVOT Area:     3.14 cm  RIGHT VENTRICLE RV S prime:     8.92 cm/s TAPSE (M-mode): 2.2 cm LEFT ATRIUM             Index        RIGHT ATRIUM          Index  LA diam:        3.50 cm 1.85 cm/m   RA Area:     9.70 cm LA Vol (A2C):   42.0 ml 22.15 ml/m  RA Volume:   18.50 ml 9.75 ml/m LA Vol (A4C):   36.1 ml 19.03 ml/m LA Biplane Vol: 38.9 ml 20.51 ml/m  AORTIC VALVE LVOT Vmax:   110.00 cm/s LVOT Vmean:  75.700 cm/s LVOT VTI:    0.247 m  AORTA Ao Root diam: 2.80 cm Ao Asc diam:  2.90 cm MITRAL VALVE               TRICUSPID VALVE MV Area (PHT): 5.66 cm    TR Peak grad:   9.9 mmHg MV Decel Time: 134 msec    TR Vmax:        157.00 cm/s MR Peak grad: 6.0 mmHg MR Mean grad: 3.0 mmHg     SHUNTS MR Vmax:      122.00 cm/s  Systemic VTI:  0.25 m MR Vmean:     87.1 cm/s    Systemic Diam: 2.00 cm MV E velocity: 82.00 cm/s MV A velocity: 90.40 cm/s MV E/A ratio:  0.91 Dalton McleanMD Electronically signed by Wilfred Lacy Signature Date/Time: 06/12/2022/1:29:41 PM    Final    CARDIAC CATHETERIZATION  Addendum Date: 06/11/2022   CONCLUSIONS: Total  occlusion very proximal RCA which is a dominant vessel with TIMI grade 0 flow treated with a 3.5 x 22 drug-eluting stent postdilated to 3.75 mm in diameter to less than 15% stenosis and restoration of TIMI grade III flow.  Right coronary contains moderate luminal irregularities throughout the mid segment but no high-grade stenosis. Normal left main Severe segmental proximal to mid 85% stenosis in the LAD. Large branching ramus intermedius with 2 severe stenoses. Circumflex with proximal to mid focal 80 to 90% stenosis.  Second obtuse marginal contains 70% stenosis. LV function is normal.  Initial LVEDP was 8 mmHg. Hypotension during the procedure related to RV involvement that improved with reperfusion, volume, and low-dose Levophed which was discontinued prior to discharge from the Cath Lab. RECOMMENDATIONS: We need to determine if the patient requires lifetime Eliquis therapy (for PE).  Last Eliquis was last evening. She was already on Plavix and is allergic to aspirin (hives).  Plan to continue Plavix, will hold Eliquis for now, and make a decision about resumption of anticoagulation with heparin or Eliquis based upon decision concerning left coronary disease within the next 12 to 24 hours. Left coronary disease needs to be treated.  The LAD and circumflex could be easily treated.  The ramus intermedius would be more problematic.  Coronary bypass grafting would likely be a problem in this patient who has had some of her saphenous veins used for lower extremity bypass grafting.   Result Date: 06/11/2022 CONCLUSIONS: Total occlusion very proximal RCA which is a dominant vessel with TIMI grade 0 flow treated with a 3.5 x 22 drug-eluting stent postdilated to 3.75 mm in diameter to less than 15% stenosis and restoration of TIMI grade III flow.  Right coronary contains moderate luminal irregularities throughout the mid segment but no high-grade stenosis. Normal left main Severe segmental proximal to mid 85% stenosis  in the LAD. Large branching ramus intermedius with 2 severe stenoses. Proximal to mid focal 80 to 90% stenosis.  Second obtuse marginal contains 70% stenosis. LV function is normal.  Initial LVEDP was 8 mmHg. Hypotension during the procedure related to RV involvement that improved with reperfusion, volume,  and low-dose Levophed which was discontinued prior to discharge from the Cath Lab. RECOMMENDATIONS: We need to determine if the patient requires lifetime Eliquis therapy (for PE).  Last Eliquis was last evening. She was already on Plavix and is allergic to aspirin (hives).  Plan to continue Plavix, will hold Eliquis for now, and make a decision about resumption of anticoagulation with heparin or Eliquis based upon decision concerning left coronary disease within the next 12 to 24 hours. Left coronary disease needs to be treated.  The LAD and circumflex could be easily treated.  The ramus intermedius would be more problematic.  Coronary bypass grafting would likely be a problem in this patient who has had some of her saphenous veins used for lower extremity bypass grafting.   Cardiac Studies     Patient Profile     61 y.o. female with nonobstructive CAD, PVD s/p aorto bifurcation bypass graft, HTN, HLD, PE, tobacco use who is being evaluation and treated for chest pain/STEMI.   Assessment & Plan    1) Acute ST elevation MI  # CAD  PCI to RCA with drug-eluting stent was done with successful revascularization. Pt doing well post procedure. Denies chest pain or SOB. NSR with SPO2 96-98% on 2L. Lungs CTA, no JVD or pedal edema. Plan for PCI with stenting today for LAD and left circumflex stenosis. Plan -PCI with stent for revascularization of LAD and left Cx stenosis -  Echo 06/12/22 : EF 60-65%. (Left ventricle demonstrates  regional wall motion abnormalities with basal inferolateral hypokinesis).  - Plavix - Heparin 5,000 units Q8H [place] - Rosuvastatin 40 mg - Resume metoprolol Tartrate 25 mg  BID -Hold Eliquis    -PVD status post L aortofemoral bypass -- Continue Plavix,   -Continue Rosuvastatin   History of PE/DVT On Eliquis 5 mg twice daily, last dose was on 11/7 -Hold Eliquis: Resume tomorrow at discharge  -[place] heparin for now   HTN -Resume Losartan 50 mg -- Resume metoprolol 25mg  BID  -D c'd Hydralazine    HLD: LDL 144 -- Continue crestor 40mg    Back pain: -Gabapentin 100 mg  -Tizanidine 2 mg  URI with cough: Patient reported dry, nonproductive cough with clear nasal drainage and mild postnasal drip.  On exam lungs clear to auscultation without rales or rhonchi.  No cough was appreciated during patient evaluation.  Patient remains afebrile.  Likely a viral cause.  I do not suspect this to be a bacterial infection, pneumonia or cough form PND or volume overload.  We will continue to monitor.   For questions or updates, please contact [facility] Please consult [email or web address] for contact info under        Signed, Reinaldo RaddleBhupinder Multani, MD  06/13/2022, 8:44 AM     Patient seen, examined. Available data reviewed. Agree with findings, assessment, and plan as outlined by Dr Nicky PughMultani.  The patient is independently interviewed and examined.  She is much more awake and alert today.  She is in no distress.  Lungs are clear, heart is regular rate and rhythm no murmur gallop, abdomen soft nontender, extremities have no edema, skin is warm dry with no rash.  Telemetry is reviewed and shows normal sinus rhythm without significant arrhythmia.  I agree with the plans as outlined above.  The patient will undergo staged PCI of the LAD and left circumflex today.  I think she will be medically stable to transfer to a telemetry bed after her PCI procedure.  I would anticipate starting her  back on apixaban tomorrow morning.  Risks, indications, and alternatives to staged PCI of the LAD and left circumflex have been extensively reviewed with the patient and her family members.  They  understand and agree to proceed.  Tonny Bollman, M.D. 06/13/2022 10:26 AM

## 2022-06-13 NOTE — Interval H&P Note (Signed)
History and Physical Interval Note:  06/13/2022 10:24 AM  Selena Clark  has presented today for surgery, with the diagnosis of cad.  The various methods of treatment have been discussed with the patient and family. After consideration of risks, benefits and other options for treatment, the patient has consented to  Procedure(s): CORONARY STENT INTERVENTION (N/A) as a surgical intervention.  The patient's history has been reviewed, patient examined, no change in status, stable for surgery.  I have reviewed the patient's chart and labs.  Questions were answered to the patient's satisfaction.    Cath Lab Visit (complete for each Cath Lab visit)  Clinical Evaluation Leading to the Procedure:   ACS: Yes.    Non-ACS:    Anginal Classification: CCS III  Anti-ischemic medical therapy: Minimal Therapy (1 class of medications)  Non-Invasive Test Results: No non-invasive testing performed. Pt admitted with inferior STEMI with occluded RCA two days ago. Now here for staged PCI of LAD and Circumflex  Prior CABG: No previous CABG        Verne Carrow

## 2022-06-14 ENCOUNTER — Other Ambulatory Visit (HOSPITAL_COMMUNITY): Payer: Self-pay

## 2022-06-14 ENCOUNTER — Telehealth: Payer: Self-pay | Admitting: Physician Assistant

## 2022-06-14 LAB — CBC
HCT: 45.8 % (ref 36.0–46.0)
Hemoglobin: 14.6 g/dL (ref 12.0–15.0)
MCH: 30.9 pg (ref 26.0–34.0)
MCHC: 31.9 g/dL (ref 30.0–36.0)
MCV: 97 fL (ref 80.0–100.0)
Platelets: 162 10*3/uL (ref 150–400)
RBC: 4.72 MIL/uL (ref 3.87–5.11)
RDW: 14 % (ref 11.5–15.5)
WBC: 4.8 10*3/uL (ref 4.0–10.5)
nRBC: 0 % (ref 0.0–0.2)

## 2022-06-14 LAB — BASIC METABOLIC PANEL
Anion gap: 5 (ref 5–15)
BUN: 9 mg/dL (ref 6–20)
CO2: 25 mmol/L (ref 22–32)
Calcium: 9.2 mg/dL (ref 8.9–10.3)
Chloride: 110 mmol/L (ref 98–111)
Creatinine, Ser: 0.8 mg/dL (ref 0.44–1.00)
GFR, Estimated: >60 mL/min (ref >60)
Glucose, Bld: 94 mg/dL (ref 70–99)
Potassium: 3.9 mmol/L (ref 3.5–5.1)
Sodium: 140 mmol/L (ref 135–145)

## 2022-06-14 MED ORDER — LOSARTAN POTASSIUM 50 MG PO TABS
50.0000 mg | ORAL_TABLET | Freq: Every day | ORAL | 2 refills | Status: DC
  Filled 2022-06-14: qty 90, 90d supply, fill #0

## 2022-06-14 MED ORDER — NITROGLYCERIN 0.4 MG SL SUBL
0.4000 mg | SUBLINGUAL_TABLET | SUBLINGUAL | 3 refills | Status: AC | PRN
  Filled 2022-06-14: qty 25, 14d supply, fill #0

## 2022-06-14 MED ORDER — METOPROLOL SUCCINATE ER 50 MG PO TB24
50.0000 mg | ORAL_TABLET | Freq: Every day | ORAL | 2 refills | Status: DC
  Filled 2022-06-14: qty 90, 90d supply, fill #0

## 2022-06-14 MED ORDER — GABAPENTIN 100 MG PO CAPS
100.0000 mg | ORAL_CAPSULE | Freq: Three times a day (TID) | ORAL | 2 refills | Status: AC
  Filled 2022-06-14: qty 90, 30d supply, fill #0

## 2022-06-14 MED ORDER — METOPROLOL TARTRATE 25 MG PO TABS
25.0000 mg | ORAL_TABLET | Freq: Two times a day (BID) | ORAL | 2 refills | Status: DC
  Filled 2022-06-14: qty 60, 30d supply, fill #0

## 2022-06-14 MED ORDER — BENZONATATE 100 MG PO CAPS
100.0000 mg | ORAL_CAPSULE | Freq: Three times a day (TID) | ORAL | 0 refills | Status: DC | PRN
  Filled 2022-06-14: qty 20, 7d supply, fill #0

## 2022-06-14 MED ORDER — APIXABAN 5 MG PO TABS: 5.0000 mg | ORAL_TABLET | Freq: Two times a day (BID) | ORAL | Status: DC

## 2022-06-14 MED ORDER — TIZANIDINE HCL 2 MG PO TABS
2.0000 mg | ORAL_TABLET | Freq: Three times a day (TID) | ORAL | 0 refills | Status: DC
  Filled 2022-06-14: qty 30, 10d supply, fill #0

## 2022-06-14 NOTE — Discharge Summary (Addendum)
Discharge Summary    Patient ID: Selena Clark MRN: [id number]; DOB: Oct 07, 1960  Admit date: 06/11/2022 Discharge date: 06/14/2022  PCP:  Kateri Mc, MD   [place] Providers Cardiologist:  Sinclair Grooms, MD        Discharge Diagnoses    Principal Problem:   Acute ST elevation myocardial infarction (STEMI) of inferior wall involving right ventricle [facility]) Active Problems:   PAD (peripheral artery disease) ([place])   Unstable angina ([place])    Diagnostic Studies/Procedures    Echo 06/12/22 : EF 60-65%. (Left ventricle demonstrates  regional wall motion abnormalities with basal inferolateral hypokinesis).   Cardiac Cath: 06/11/2022    Left Anterior Descending  Prox LAD to Mid LAD lesion is 90% stenosed.    Ramus Intermedius  Vessel is large.  Ramus-1 lesion is 70% stenosed.  Ramus-2 lesion is 85% stenosed.    Left Circumflex  Prox Cx lesion is 90% stenosed.    Second Obtuse Marginal Branch  Vessel is large in size.  2nd Mrg lesion is 70% stenosed.    Right Coronary Artery  Prox RCA-1 lesion is 100% stenosed.  Prox RCA-2 lesion is 40% stenosed.  Prox RCA to Mid RCA lesion is 35% stenosed.  Mid RCA lesion is 55% stenosed.       Diagnostic Dominance: Right  Intervention    Cardiac cath on 06/13/2022  Diagnostic Dominance: Right  Intervention     History of Present Illness     Selena Clark is a 61 y.o. female with H/O nonobstructive CAD 2015, PVD s/p aorto bifurcation bypass graft on Plavix, aspirin allergy, HTN, HLD, PE on Eliquis 2021, tobacco use who presented with chest pain and found to have inferior wall STEMI.  Pt were transported to the ED on 11/7 after developing episode of centralized chest pain while watching TV on the couch.  She took a sublingual nitroglycerin and walked to the bathroom. She had a syncopal episode and was found on the floor by her husband.  He called 911. EKG showed sinus rhythm, 98 bpm with  inferior ST elevation. Code STEMI was called in the field. No aspirin allergy. She was brought directly to the Cath Lab for emergent cardiac catheterization.   Hospital Course   Assessment and plan:  1) Acute ST elevation MI  # PCI with RCA, LAD and left circumflex stenting  # CAD  Pt presented with chest pain and found to have inferior wall STEMI. Pt underwent PCI with DES to RCA with successful revascularization on 06/11/2022 Pt did well post procedure. She remained in NSR and did not experience chest pain or SOB. Pt underwent staged PCI with stenting of LAD and left circumflex on 06/14/2022 with successful revascularization. Pt tolerated the procedure well. Pt continued on clopidogrel because of her need for chronic oral anticoagulation.  She remained in NSR. BP stable. No JVD, PND or orthopnea. Lungs CTA. No pedal edema. Denied chest pain or SOB during the entire hospital stay. Lungs CTA, no JVD or pedal edema.  - Echo 06/12/22 : EF 60-65%. (Left ventricle demonstrates  regional wall motion abnormalities with basal inferolateral hypokinesis). - Continue Plavix 104m daily - Rosuvastatin 40 mg - LDL was 144 but Dr. CBurt Knackwas not certain this reflected adherence to regimen and recommends recheck in outpatient setting - Toprol XL 50 once daily - Losartan 50 mg once daily - Not on ASA due to allergy   -PVD status post L aortofemoral bypass -- Continue Plavix,   -  Continue Rosuvastatin   History of PE/DVT -Eliquis: Resumed at discharge   HTN - Losartan 50 mg -- Toprol XL 50 mg once daily  -D c'd Hydralazine    HLD: LDL 144 -- Continue crestor 20m (as above)   Back pain: -Extremely somnolent on prior to admission regimen therefore gabapentin reduced to 100 mg TID and tizanidine 2 mg TID - Recommend follow-up with provider managing these medicines chronically   URI with cough: Patient reported dry, nonproductive cough with clear nasal drainage and mild postnasal drip.  On exam  lungs clear to auscultation without rales or rhonchi.  No cough was appreciated during patient evaluation.  Patient remains afebrile.  Likely a viral cause.  I do not suspect this to be a bacterial infection, pneumonia or cough form PND or volume overload.  -Continue Benzonatate   Tobacco use: Counseled on cessation. Patient states she has done well decreasing as OP. Can consider Chantix as OP.  Dr. CBurt Knackhas seen and examined the patient and feels they are stable for discharge.   Did the patient have an acute coronary syndrome (MI, NSTEMI, STEMI, etc) this admission?:  Yes                               AHA/ACC Clinical Performance & Quality Measures: Aspirin prescribed? - Pt have aspirin allergy ADP Receptor Inhibitor (Plavix/Clopidogrel, Brilinta/Ticagrelor or Effient/Prasugrel) prescribed (includes medically managed patients)? - Yes Beta Blocker prescribed? - Yes High Intensity Statin (Lipitor 40-871mor Crestor 20-4072mprescribed? - Yes EF assessed during THIS hospitalization? - Yes For EF <40%, was ACEI/ARB prescribed? - Not Applicable (EF >/= 40%63%or EF <40%, Aldosterone Antagonist (Spironolactone or Eplerenone) prescribed? - Yes Cardiac Rehab Phase II ordered (including medically managed patients)? - Yes       The patient will be scheduled for a TOC follow up appointment No Discharge Vitals Blood pressure 137/86, pulse 72, temperature 98.9 F (37.2 C), temperature source Oral, resp. rate 10, height 5' 2.5" (1.588 m), weight 87.8 kg, SpO2 93 %.  Filed Weights   06/11/22 1534 06/12/22 0400  Weight: 84.8 kg 87.8 kg    Labs & Radiologic Studies    CBC Recent Labs    06/11/22 1559 06/11/22 1604 06/13/22 0632 06/14/22 0456  WBC 5.5   < > 5.0 4.8  NEUTROABS 2.9  --   --   --   HGB 14.1   < > 14.1 14.6  HCT 42.8   < > 44.6 45.8  MCV 95.5   < > 99.1 97.0  PLT 154   < > 163 162   < > = values in this interval not displayed.   Basic Metabolic Panel Recent Labs     06/13/22 0632 06/14/22 0456  NA 138 140  K 3.5 3.9  CL 107 110  CO2 27 25  GLUCOSE 84 94  BUN 11 9  CREATININE 0.99 0.80  CALCIUM 8.8* 9.2   Liver Function Tests Recent Labs    06/11/22 1559  AST 19  ALT 10  ALKPHOS 66  BILITOT 0.5  PROT 5.8*  ALBUMIN 2.8*   No results for input(s): "LIPASE", "AMYLASE" in the last 72 hours. High Sensitivity Troponin:   Recent Labs  Lab 06/11/22 1559 06/11/22 1859 06/12/22 0426  TROPONINIHS 7 1,812* 4,185*    BNP Invalid input(s): "POCBNP" D-Dimer No results for input(s): "DDIMER" in the last 72 hours. Hemoglobin A1C Recent Labs  06/11/22 1559  HGBA1C 5.6   Fasting Lipid Panel Recent Labs    06/11/22 1559  CHOL 200  HDL 37*  LDLCALC 144*  TRIG 97  CHOLHDL 5.4   Thyroid Function Tests No results for input(s): "TSH", "T4TOTAL", "T3FREE", "THYROIDAB" in the last 72 hours.  Invalid input(s): "FREET3" _____________  CARDIAC CATHETERIZATION  Result Date: 06/13/2022   Prox LAD to Mid LAD lesion is 90% stenosed.   Prox Cx lesion is 90% stenosed.   Ramus-1 lesion is 70% stenosed.   Ramus-2 lesion is 85% stenosed.   2nd Mrg lesion is 50% stenosed.   A drug-eluting stent was successfully placed using a STENT ONYX FRONTIER 2.5X12.   A drug-eluting stent was successfully placed using a STENT ONYX FRONTIER 3.0X15.   Post intervention, there is a 0% residual stenosis.   Post intervention, there is a 0% residual stenosis. Severe mid LAD stenosis Successful PTCA/DES x 1 mid LAD (Onyx Frontier stent used due to size of stents available) Severe proximal Circumflex stenosis Successful PTCA/DES x 1 proximal Circumflex Recommendations: Continue Plavix, statin and beta blocker.   ECHOCARDIOGRAM COMPLETE  Result Date: 06/12/2022    ECHOCARDIOGRAM REPORT   Patient Name:   Selena Clark [place] Date of Exam: 06/12/2022 Medical Rec #:  [id number]        Height:       62.5 in Accession #:    [id number]       Weight:       193.6 lb Date of Birth:   01-25-1961        BSA:          1.897 m Patient Age:    62 years         BP:           95/29 mmHg Patient Gender: F                HR:           75 bpm. Exam Location:  Inpatient Procedure: 2D Echo, Color Doppler and Cardiac Doppler Indications:    Myocardial Infact I21,9  History:        Patient has prior history of Echocardiogram examinations, most                 recent 03/22/2020. STEMI, PAD, Signs/Symptoms:Chest Pain; Risk                 Factors:Current Smoker and Dyslipidemia.  Sonographer:    Greer Pickerel Referring Phys: Greer  Sonographer Comments: Suboptimal subcostal window and suboptimal parasternal window. Image acquisition challenging due to patient body habitus and Image acquisition challenging due to respiratory motion. IMPRESSIONS  1. Left ventricular ejection fraction, by estimation, is 55 to 60%. The left ventricle has normal function. The left ventricle demonstrates regional wall motion abnormalities with basal inferolateral hypokinesis. Left ventricular diastolic parameters are consistent with Grade I diastolic dysfunction (impaired relaxation).  2. Right ventricular systolic function is mildly reduced. The right ventricular size is mildly enlarged. There is normal pulmonary artery systolic pressure. The estimated right ventricular systolic pressure is 41.9 mmHg.  3. The mitral valve is normal in structure. No evidence of mitral valve regurgitation. No evidence of mitral stenosis.  4. The aortic valve is tricuspid. Aortic valve regurgitation is not visualized. No aortic stenosis is present.  5. The inferior vena cava is normal in size with greater than 50% respiratory variability, suggesting right atrial pressure of 3 mmHg. FINDINGS  Left Ventricle: Left  ventricular ejection fraction, by estimation, is 55 to 60%. The left ventricle has normal function. The left ventricle demonstrates regional wall motion abnormalities. The left ventricular internal cavity size was normal in size.  There is no left ventricular hypertrophy. Left ventricular diastolic parameters are consistent with Grade I diastolic dysfunction (impaired relaxation). Right Ventricle: The right ventricular size is mildly enlarged. No increase in right ventricular wall thickness. Right ventricular systolic function is mildly reduced. There is normal pulmonary artery systolic pressure. The tricuspid regurgitant velocity  is 1.57 m/s, and with an assumed right atrial pressure of 3 mmHg, the estimated right ventricular systolic pressure is 63.8 mmHg. Left Atrium: Left atrial size was normal in size. Right Atrium: Right atrial size was normal in size. Pericardium: There is no evidence of pericardial effusion. Mitral Valve: The mitral valve is normal in structure. No evidence of mitral valve regurgitation. No evidence of mitral valve stenosis. Tricuspid Valve: The tricuspid valve is normal in structure. Tricuspid valve regurgitation is trivial. Aortic Valve: The aortic valve is tricuspid. Aortic valve regurgitation is not visualized. No aortic stenosis is present. Pulmonic Valve: The pulmonic valve was normal in structure. Pulmonic valve regurgitation is not visualized. Aorta: The aortic root is normal in size and structure. Venous: The inferior vena cava is normal in size with greater than 50% respiratory variability, suggesting right atrial pressure of 3 mmHg. IAS/Shunts: No atrial level shunt detected by color flow Doppler.  LEFT VENTRICLE PLAX 2D LVIDd:         3.90 cm   Diastology LVIDs:         2.80 cm   LV e' medial:    10.20 cm/s LV PW:         1.00 cm   LV E/e' medial:  8.0 LV IVS:        0.80 cm   LV e' lateral:   11.00 cm/s LVOT diam:     2.00 cm   LV E/e' lateral: 7.5 LV SV:         78 LV SV Index:   41 LVOT Area:     3.14 cm  RIGHT VENTRICLE RV S prime:     8.92 cm/s TAPSE (M-mode): 2.2 cm LEFT ATRIUM             Index        RIGHT ATRIUM          Index LA diam:        3.50 cm 1.85 cm/m   RA Area:     9.70 cm LA Vol  (A2C):   42.0 ml 22.15 ml/m  RA Volume:   18.50 ml 9.75 ml/m LA Vol (A4C):   36.1 ml 19.03 ml/m LA Biplane Vol: 38.9 ml 20.51 ml/m  AORTIC VALVE LVOT Vmax:   110.00 cm/s LVOT Vmean:  75.700 cm/s LVOT VTI:    0.247 m  AORTA Ao Root diam: 2.80 cm Ao Asc diam:  2.90 cm MITRAL VALVE               TRICUSPID VALVE MV Area (PHT): 5.66 cm    TR Peak grad:   9.9 mmHg MV Decel Time: 134 msec    TR Vmax:        157.00 cm/s MR Peak grad: 6.0 mmHg MR Mean grad: 3.0 mmHg     SHUNTS MR Vmax:      122.00 cm/s  Systemic VTI:  0.25 m MR Vmean:     87.1 cm/s    Systemic  Diam: 2.00 cm MV E velocity: 82.00 cm/s MV A velocity: 90.40 cm/s MV E/A ratio:  0.91 Dalton McleanMD Electronically signed by Franki Monte Signature Date/Time: 06/12/2022/1:29:41 PM    Final    CARDIAC CATHETERIZATION  Addendum Date: 06/11/2022   CONCLUSIONS: Total occlusion very proximal RCA which is a dominant vessel with TIMI grade 0 flow treated with a 3.5 x 22 drug-eluting stent postdilated to 3.75 mm in diameter to less than 15% stenosis and restoration of TIMI grade III flow.  Right coronary contains moderate luminal irregularities throughout the mid segment but no high-grade stenosis. Normal left main Severe segmental proximal to mid 85% stenosis in the LAD. Large branching ramus intermedius with 2 severe stenoses. Circumflex with proximal to mid focal 80 to 90% stenosis.  Second obtuse marginal contains 70% stenosis. LV function is normal.  Initial LVEDP was 8 mmHg. Hypotension during the procedure related to RV involvement that improved with reperfusion, volume, and low-dose Levophed which was discontinued prior to discharge from the Cath Lab. RECOMMENDATIONS: We need to determine if the patient requires lifetime Eliquis therapy (for PE).  Last Eliquis was last evening. She was already on Plavix and is allergic to aspirin (hives).  Plan to continue Plavix, will hold Eliquis for now, and make a decision about resumption of anticoagulation with  heparin or Eliquis based upon decision concerning left coronary disease within the next 12 to 24 hours. Left coronary disease needs to be treated.  The LAD and circumflex could be easily treated.  The ramus intermedius would be more problematic.  Coronary bypass grafting would likely be a problem in this patient who has had some of her saphenous veins used for lower extremity bypass grafting.   Result Date: 06/11/2022 CONCLUSIONS: Total occlusion very proximal RCA which is a dominant vessel with TIMI grade 0 flow treated with a 3.5 x 22 drug-eluting stent postdilated to 3.75 mm in diameter to less than 15% stenosis and restoration of TIMI grade III flow.  Right coronary contains moderate luminal irregularities throughout the mid segment but no high-grade stenosis. Normal left main Severe segmental proximal to mid 85% stenosis in the LAD. Large branching ramus intermedius with 2 severe stenoses. Proximal to mid focal 80 to 90% stenosis.  Second obtuse marginal contains 70% stenosis. LV function is normal.  Initial LVEDP was 8 mmHg. Hypotension during the procedure related to RV involvement that improved with reperfusion, volume, and low-dose Levophed which was discontinued prior to discharge from the Cath Lab. RECOMMENDATIONS: We need to determine if the patient requires lifetime Eliquis therapy (for PE).  Last Eliquis was last evening. She was already on Plavix and is allergic to aspirin (hives).  Plan to continue Plavix, will hold Eliquis for now, and make a decision about resumption of anticoagulation with heparin or Eliquis based upon decision concerning left coronary disease within the next 12 to 24 hours. Left coronary disease needs to be treated.  The LAD and circumflex could be easily treated.  The ramus intermedius would be more problematic.  Coronary bypass grafting would likely be a problem in this patient who has had some of her saphenous veins used for lower extremity bypass grafting.  Disposition    Pt is being discharged home today in Stable condition.  Follow-up Plans & Appointments     Follow-up Information     Liliane Shi, PA-C Follow up.   Specialties: Cardiology, Physician Assistant Why: [facility] - cardiology follow-up scheduled on Monday Jun 24, 2022 2:20 PM (  Arrive by 2:05 PM). Nicki Reaper is one of the PAs with our cardiology team. Contact information: [address] [place] [address] [phone]                Discharge Instructions     Amb Referral to Cardiac Rehabilitation   Complete by: As directed    Diagnosis:  Coronary Stents STEMI     After initial evaluation and assessments completed: Virtual Based Care may be provided alone or in conjunction with Phase 2 Cardiac Rehab based on patient barriers.: Yes   Intensive Cardiac Rehabilitation (ICR) [place] location only OR Traditional Cardiac Rehabilitation (TCR) *If criteria for ICR are not met will enroll in TCR [facility] only): Yes   Call MD for:   Complete by: As directed    Worsening chest pain, SOB, palpitations, dizziness.   Call MD for:  persistant nausea and vomiting   Complete by: As directed    Call MD for:  severe uncontrolled pain   Complete by: As directed    Call MD for:  temperature >100.4   Complete by: As directed    Diet - low sodium heart healthy   Complete by: As directed    Face-to-face encounter (required for Medicare/Medicaid patients)   Complete by: As directed    I Bhupinder Multani certify that this patient is under my care and that I, or a nurse practitioner or physician's assistant working with me, had a face-to-face encounter that meets the physician face-to-face encounter requirements with this patient on 06/14/2022. The encounter with the patient was in whole, or in part for the following medical condition(s) which is the primary reason for home health care (List medical condition):   PCI with stents   The encounter with the patient was in  whole, or in part, for the following medical condition, which is the primary reason for home health care: cardiac rehab   I certify that, based on my findings, the following services are medically necessary home health services: Physical therapy   Reason for Medically Necessary Home Health Services: Other See Comments   My clinical findings support the need for the above services: OTHER SEE COMMENTS   Further, I certify that my clinical findings support that this patient is homebound due to: Pain interferes with ambulation/mobility   Home Health   Complete by: As directed    To provide the following care/treatments:  PT OT     Increase activity slowly   Complete by: As directed    No driving for 2 weeks. No lifting over 10 lbs for 4 weeks. No sexual activity for 4 weeks. You may not return to work until cleared by your cardiologist (if applicable). Keep procedure site clean & dry. If you notice increased pain, swelling, bleeding or pus, call/return!  You may shower, but no soaking baths/hot tubs/pools for 1 week.        Discharge Medications   Allergies as of 06/14/2022       Reactions   Albuterol Nausea And Vomiting   Aspirin Hives, Swelling, Rash        Medication List     STOP taking these medications    hydrALAZINE 10 MG tablet Commonly known as: APRESOLINE   metoprolol tartrate 25 MG tablet Commonly known as: LOPRESSOR   Myrbetriq 50 MG Tb24 tablet Generic drug: mirabegron ER       TAKE these medications    apixaban 5 MG Tabs tablet Commonly known as: ELIQUIS Take 1 tablet (  5 mg total) by mouth 2 (two) times daily.   benzonatate 100 MG capsule Commonly known as: TESSALON Take 1 capsule (100 mg total) by mouth 3 (three) times daily as needed for cough.   clopidogrel 75 MG tablet Commonly known as: PLAVIX Take 75 mg by mouth daily.   dicyclomine 20 MG tablet Commonly known as: BENTYL Take 20 mg by mouth 3 (three) times daily.   escitalopram 20 MG  tablet Commonly known as: LEXAPRO Take 20 mg by mouth daily.   gabapentin 100 MG capsule Commonly known as: NEURONTIN Take 1 capsule (100 mg total) by mouth 3 (three) times daily. What changed:  medication strength how much to take when to take this   losartan 50 MG tablet Commonly known as: COZAAR Take 1 tablet (50 mg total) by mouth daily. Start taking on: June 15, 2022   metoprolol succinate 50 MG 24 hr tablet Commonly known as: Toprol XL Take 1 tablet (50 mg total) by mouth daily. Take with or immediately following a meal. What changed:  medication strength how much to take additional instructions   nitroGLYCERIN 0.4 MG SL tablet Commonly known as: Nitrostat Place 1 tablet (0.4 mg total) under the tongue every 5 (five) minutes as needed for chest pain (up to 3 doses. If taking 3rd dose call 911).   oxyCODONE-acetaminophen 10-325 MG tablet Commonly known as: PERCOCET Take 1 tablet by mouth 3 (three) times daily as needed.   pantoprazole 40 MG tablet Commonly known as: PROTONIX Take 40 mg by mouth daily.   rosuvastatin 40 MG tablet Commonly known as: CRESTOR Take 40 mg by mouth daily.   solifenacin 10 MG tablet Commonly known as: VESICARE Take 10 mg by mouth daily.   tiZANidine 2 MG tablet Commonly known as: ZANAFLEX Take 1 tablet (2 mg total) by mouth 3 (three) times daily. What changed:  medication strength how much to take   topiramate 100 MG tablet Commonly known as: TOPAMAX Take 100 mg by mouth daily.   traZODone 100 MG tablet Commonly known as: DESYREL Take 200 mg by mouth at bedtime as needed for sleep.           Outstanding Labs/Studies  None  Duration of Discharge Encounter   Greater than 30 minutes including physician time.  Signed, Teola Bradley, MD 06/14/2022, 10:52 AM [id number]  Patient seen, examined. Available data reviewed. Agree with findings, assessment, and plan as outlined by Dr Jeanett Schlein.  The patient is  independently interviewed and examined.  She is alert, oriented, in no distress.  Lungs are clear bilaterally, heart is regular rate rhythm no murmur gallop, abdomen soft nontender, extremities have no edema, right radial site is clear with no hematoma or ecchymosis.  The patient is done very well with respect to her interventional procedures after undergoing primary PCI of the RCA for treatment of STEMI followed by staged PCI of the LAD and left circumflex yesterday.  She will be discharged on clopidogrel monotherapy in this context of aspirin allergy.  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  Otherwise her medications are outlined above.  I think she is medically stable for discharge today.  Post MI restrictions are reviewed with the patient.  Questions are answered and instructions given to both the patient and her husband who is present at the bedside.  Sherren Mocha, M.D. 06/14/2022 11:19 AM

## 2022-06-14 NOTE — Progress Notes (Signed)
CARDIAC REHAB PHASE I   PRE:  Rate/Rhythm: 74 SR    BP: sitting 137/86    SaO2: 93 RA  MODE:  Ambulation: 370 ft   POST:  Rate/Rhythm: 86 SR    BP: sitting 122/91     SaO2: 95 RA  Tolerated well, no c/o.  Pt only uses cane for long distances. Reviewed ed with pt and husband. She is thinking about quitting smoking and discussed methods. Resources given. She has had success with Chantix previously and sts she can try that. Encouraged exercise priority.  [id number]  Ethelda Chick BS, ACSM-CEP 06/14/2022 10:29 AM

## 2022-06-14 NOTE — Discharge Instructions (Addendum)
Radial Site Care Refer to this sheet in the next few weeks. These instructions provide you with information on caring for yourself after your procedure. Your caregiver may also give you more specific instructions. Your treatment has been planned according to current medical practices, but problems sometimes occur. Call your caregiver if you have any problems or questions after your procedure. HOME CARE INSTRUCTIONS You may shower the day after the procedure. Remove the bandage (dressing) and gently wash the site with plain soap and water. Gently pat the site dry.  Do not apply powder or lotion to the site.  Do not submerge the affected site in water for 3 to 5 days.  Inspect the site at least twice daily.  Do not flex or bend the affected arm for 24 hours.  No lifting over 5 pounds (2.3 kg) for 5 days after your procedure.  Do not drive home if you are discharged the same day of the procedure. Have someone else drive you.  You may drive 24 hours after the procedure unless otherwise instructed by your caregiver.  What to expect: Any bruising will usually fade within 1 to 2 weeks.  Blood that collects in the tissue (hematoma) may be painful to the touch. It should usually decrease in size and tenderness within 1 to 2 weeks.  SEEK IMMEDIATE MEDICAL CARE IF: You have unusual pain at the radial site.  You have redness, warmth, swelling, or pain at the radial site.  You have drainage (other than a small amount of blood on the dressing).  You have chills.  You have a fever or persistent symptoms for more than 72 hours.  You have a fever and your symptoms suddenly get worse.  Your arm becomes pale, cool, tingly, or numb.  You have heavy bleeding from the site. Hold pressure on the site.     No driving for 2 weeks. No lifting over 10 lbs for 4 weeks. No sexual activity for 4 weeks. Marland Kitchen Keep procedure site clean & dry. If you notice increased pain, swelling, bleeding or pus, call/return!  You may  shower, but no soaking baths/hot tubs/pools for 1 week.    Information on my medicine - ELIQUIS (apixaban)  This medication education was reviewed with me or my healthcare representative as part of my discharge preparation.   Why was Eliquis prescribed for you? Eliquis was prescribed to treat blood clots that may have been found in the veins of your legs (deep vein thrombosis) or in your lungs (pulmonary embolism) and to reduce the risk of them occurring again.  What do You need to know about Eliquis ? Take ONE 5 mg tablet taken TWICE daily.  Eliquis may be taken with or without food.   Try to take the dose about the same time in the morning and in the evening. If you have difficulty swallowing the tablet whole please discuss with your pharmacist how to take the medication safely.  Take Eliquis exactly as prescribed and DO NOT stop taking Eliquis without talking to the doctor who prescribed the medication.  Stopping may increase your risk of developing a new blood clot.  Refill your prescription before you run out.  After discharge, you should have regular check-up appointments with your healthcare provider that is prescribing your Eliquis.    What do you do if you miss a dose? If a dose of ELIQUIS is not taken at the scheduled time, take it as soon as possible on the same day and twice-daily administration  should be resumed. The dose should not be doubled to make up for a missed dose.  Important Safety Information A possible side effect of Eliquis is bleeding. You should call your healthcare provider right away if you experience any of the following: Bleeding from an injury or your nose that does not stop. Unusual colored urine (red or dark brown) or unusual colored stools (red or black). Unusual bruising for unknown reasons. A serious fall or if you hit your head (even if there is no bleeding).  Some medicines may interact with Eliquis and might increase your risk of bleeding  or clotting while on Eliquis. To help avoid this, consult your healthcare provider or pharmacist prior to using any new prescription or non-prescription medications, including herbals, vitamins, non-steroidal anti-inflammatory drugs (NSAIDs) and supplements.  This website has more information on Eliquis (apixaban): http://www.eliquis.com/eliquis/home

## 2022-06-14 NOTE — Telephone Encounter (Signed)
   Attention TOC pool,  This patient will need a TOC phone call after discharge. They are being discharged today. Follow-up appointment has already been arranged with: Lorin Picket on 11/20 They are a patient of Lesleigh Noe, MD.  Thank you! Laurann Montana, PA-C

## 2022-06-14 NOTE — Plan of Care (Signed)
  Problem: Education: Goal: Knowledge of General Education information will improve Description: Including pain rating scale, medication(s)/side effects and non-pharmacologic comfort measures Outcome: Progressing   Problem: Health Behavior/Discharge Planning: Goal: Ability to manage health-related needs will improve Outcome: Progressing   Problem: Clinical Measurements: Goal: Ability to maintain clinical measurements within normal limits will improve Outcome: Progressing Goal: Will remain free from infection Outcome: Progressing Goal: Diagnostic test results will improve Outcome: Progressing Goal: Respiratory complications will improve Outcome: Progressing Goal: Cardiovascular complication will be avoided Outcome: Progressing   Problem: Activity: Goal: Risk for activity intolerance will decrease Outcome: Progressing   Problem: Nutrition: Goal: Adequate nutrition will be maintained Outcome: Progressing   Problem: Coping: Goal: Level of anxiety will decrease Outcome: Progressing   Problem: Elimination: Goal: Will not experience complications related to urinary retention Outcome: Progressing   Problem: Pain Managment: Goal: General experience of comfort will improve Outcome: Progressing   Problem: Safety: Goal: Ability to remain free from injury will improve Outcome: Progressing   Problem: Skin Integrity: Goal: Risk for impaired skin integrity will decrease Outcome: Progressing   Problem: Activity: Goal: Ability to return to baseline activity level will improve Outcome: Progressing   Problem: Cardiovascular: Goal: Ability to achieve and maintain adequate cardiovascular perfusion will improve Outcome: Progressing Goal: Vascular access site(s) Level 0-1 will be maintained Outcome: Progressing   Problem: Health Behavior/Discharge Planning: Goal: Ability to safely manage health-related needs after discharge will improve Outcome: Progressing

## 2022-06-17 NOTE — Telephone Encounter (Signed)
**Note De-Identified Selena Clark Obfuscation** Patient contacted regarding discharge from [facility] on 06/14/2022.  Patient understands to follow up with provider Tereso Newcomer, PA-c on 06/24/2022 at 2:20 at [address] in [place], [place] [address].  Patient understands discharge instructions? Yes Patient understands medications and regiment? Yes Patient understands to bring all medications to this visit? Yes  Ask patient:  Are you enrolled in My Chart: Yes  The pt is without any complaints at this time and denies having any CP/discomfort, nausea, vomiting, diaphoresis, dizziness or lightheadedness. She verified that she does have [facility]'s phone number and states that she will call us if she has any questions or concerns. She thanked me for calling her.

## 2022-06-24 ENCOUNTER — Ambulatory Visit: Payer: Medicare HMO | Attending: Physician Assistant | Admitting: Physician Assistant

## 2022-06-24 ENCOUNTER — Encounter: Payer: Self-pay | Admitting: Physician Assistant

## 2022-06-24 VITALS — BP 122/60 | HR 71 | Ht 62.5 in | Wt 189.6 lb

## 2022-06-24 DIAGNOSIS — I10 Essential (primary) hypertension: Secondary | ICD-10-CM

## 2022-06-24 DIAGNOSIS — E785 Hyperlipidemia, unspecified: Secondary | ICD-10-CM

## 2022-06-24 DIAGNOSIS — G894 Chronic pain syndrome: Secondary | ICD-10-CM

## 2022-06-24 DIAGNOSIS — Z720 Tobacco use: Secondary | ICD-10-CM

## 2022-06-24 DIAGNOSIS — Z86711 Personal history of pulmonary embolism: Secondary | ICD-10-CM

## 2022-06-24 DIAGNOSIS — I2119 ST elevation (STEMI) myocardial infarction involving other coronary artery of inferior wall: Secondary | ICD-10-CM | POA: Diagnosis not present

## 2022-06-24 MED ORDER — LOSARTAN POTASSIUM 50 MG PO TABS: 50.0000 mg | ORAL_TABLET | Freq: Every day | ORAL | 2 refills | Status: DC

## 2022-06-24 MED ORDER — VARENICLINE TARTRATE 1 MG PO TABS: 1.0000 mg | ORAL_TABLET | Freq: Two times a day (BID) | ORAL | 3 refills | Status: DC

## 2022-06-24 MED ORDER — VARENICLINE TARTRATE (STARTER) 0.5 MG X 11 & 1 MG X 42 PO TBPK: 1.0000 | ORAL_TABLET | ORAL | 0 refills | Status: DC

## 2022-06-24 NOTE — Assessment & Plan Note (Signed)
She remains on long-term anticoagulation therapy.  Continue Eliquis 5 mg twice daily.

## 2022-06-24 NOTE — Patient Instructions (Addendum)
Medication Instructions:  Your physician recommends that you continue on your current medications as directed. Please refer to the Current Medication list given to you today.  I will call you and go over the [facility] instructions.   *If you need a refill on your cardiac medications before your next appointment, please call your pharmacy*   Lab Work: 6-8 WEEKS:  FASTING LIPID & CMET  If you have labs (blood work) drawn today and your tests are completely normal, you will receive your results only by: MyChart Message (if you have MyChart) OR A paper copy in the mail If you have any lab test that is abnormal or we need to change your treatment, we will call you to review the results.   Testing/Procedures: None ordered   Follow-Up: At [facility], you and your health needs are our priority.  As part of our continuing mission to provide you with exceptional heart care, we have created designated Provider Care Teams.  These Care Teams include your primary Cardiologist (physician) and Advanced Practice Providers (APPs -  Physician Assistants and Nurse Practitioners) who all work together to provide you with the care you need, when you need it.  We recommend signing up for the patient portal called "MyChart".  Sign up information is provided on this After Visit Summary.  MyChart is used to connect with patients for Virtual Visits (Telemedicine).  Patients are able to view lab/test results, encounter notes, upcoming appointments, etc.  Non-urgent messages can be sent to your provider as well.   To learn more about what you can do with MyChart, go to [email or web address].    Your next appointment:   3 month(s)  The format for your next appointment:   In Person  Provider:   Tonny Bollman, MD  or Tereso Newcomer, PA-C         Other Instructions   Important Information About Sugar     c

## 2022-06-24 NOTE — Assessment & Plan Note (Signed)
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  She underwent staged PCI with DES to the proximal LAD and DES to the proximal LCx.  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  She is doing well without anginal symptoms.  We discussed the rationale for dual antiplatelet therapy.  However, she does have an aspirin allergy.  She also requires long-term anticoagulation therapy with Eliquis.  Continue Eliquis 5 mg twice daily, Plavix 75 mg daily, Toprol-XL 50 mg daily, losartan 50 mg daily, Crestor 40 mg daily.  I have encouraged her to start cardiac rehabilitation.  In light of Dr. Michaelle Copas retirement, I will have her follow-up with Dr. Excell Seltzer and me.  Follow-up in 3 months.

## 2022-06-24 NOTE — Assessment & Plan Note (Signed)
Blood pressure is well controlled.  Continue losartan 50 mg daily, Toprol-XL 50 mg daily.

## 2022-06-24 NOTE — Assessment & Plan Note (Signed)
Some of her medications were reduced in the hospital secondary to hypersomnolence.  I have asked her to follow-up with her primary care provider for refills on gabapentin as well as tizanidine.

## 2022-06-24 NOTE — Progress Notes (Signed)
Cardiology Office Note:    Date:  06/24/2022   ID:  Selena Clark, DOB [birth year], MRN [id number]  PCP:  Kateri Mc, MD  [place] Providers Cardiologist:  Sherren Mocha, MD Cardiology APP:  Sharmon Revere    Referring MD: Kateri Mc, MD   Chief Complaint:  Hospitalization Follow-up (S/p Inf MI >> PCI)    Patient Profile: Coronary artery disease Cath 10/04/13 ([facility]) - mild-mod non-obs CAD (LCx and LAD FFR normal); NL EF Inf STEMI 06/11/22 S/p 3.5 x 22 mm Synergy DES to pRCA S/p staged PCI w 3 x 15 mm Onyx Frontier DES to the pLAD; 2.5 x 12 mm Onyx Frontier DES to the pLCx Original Cath 06/11/22: pLAD 90, RI 70, 80; pLCx 90, OM2 67; pRCA 100  Echo 06/12/2022: EF 55-60, inferolateral HK, GR 1 DD, mildly reduced RVSF, normal PASP (RVSP 12.9), RAP 3 Peripheral arterial disease  S/p aorto-bifem bypass Aortic atherosclerosis  Hx of recurrent DVT/pulmonary embolism  ASA Allergy  Hypertension  Hyperlipidemia  +Cigs   Cardiac Studies & Procedures   CARDIAC CATHETERIZATION  CARDIAC CATHETERIZATION 06/13/2022  Narrative   Prox LAD to Mid LAD lesion is 90% stenosed.   Prox Cx lesion is 90% stenosed.   Ramus-1 lesion is 70% stenosed.   Ramus-2 lesion is 85% stenosed.   2nd Mrg lesion is 50% stenosed.   A drug-eluting stent was successfully placed using a STENT ONYX FRONTIER 2.5X12.   A drug-eluting stent was successfully placed using a STENT ONYX FRONTIER 3.0X15.   Post intervention, there is a 0% residual stenosis.   Post intervention, there is a 0% residual stenosis.  Severe mid LAD stenosis Successful PTCA/DES x 1 mid LAD (Onyx Frontier stent used due to size of stents available) Severe proximal Circumflex stenosis Successful PTCA/DES x 1 proximal Circumflex  Recommendations: Continue Plavix, statin and beta blocker.  Findings Coronary Findings Diagnostic  Dominance: Right  Left Anterior Descending Prox LAD to Mid LAD lesion is 90%  stenosed.  Ramus Intermedius Vessel is large. Ramus-1 lesion is 70% stenosed. Ramus-2 lesion is 85% stenosed.  Left Circumflex Prox Cx lesion is 90% stenosed.  Second Obtuse Marginal Branch Vessel is large in size. 2nd Mrg lesion is 50% stenosed.  Intervention  Prox LAD to Mid LAD lesion Stent CATH VISTA GUIDE 6FR XB3 guide catheter was inserted. Lesion crossed with guidewire using a WIRE COUGAR XT STRL 190CM. Pre-stent angioplasty was performed using a BALLN EMERGE MR 2.0X8. A drug-eluting stent was successfully placed using a STENT ONYX FRONTIER 3.0X15. Stent strut is well apposed. Post-stent angioplasty was performed using a BALL SAPPHIRE NC24 3.25X12. Post-Intervention Lesion Assessment The intervention was successful. Pre-interventional TIMI flow is 3. Post-intervention TIMI flow is 3. No complications occurred at this lesion. There is a 0% residual stenosis post intervention.  Prox Cx lesion Stent CATH VISTA GUIDE 6FR XB3 guide catheter was inserted. Lesion crossed with guidewire using a WIRE COUGAR XT STRL 190CM. Pre-stent angioplasty was performed using a BALLN EMERGE MR 2.0X8. A drug-eluting stent was successfully placed using a STENT ONYX FRONTIER 2.5X12. Stent strut is well apposed. Post-stent angioplasty was performed using a BALL SAPPHIRE NC24 2.75X8. Post-Intervention Lesion Assessment The intervention was successful. Pre-interventional TIMI flow is 3. Post-intervention TIMI flow is 3. No complications occurred at this lesion. There is a 0% residual stenosis post intervention.   CARDIAC CATHETERIZATION 06/11/2022  Narrative CONCLUSIONS: Total occlusion very proximal RCA which is a dominant vessel with TIMI grade 0  flow treated with a 3.5 x 22 drug-eluting stent postdilated to 3.75 mm in diameter to less than 15% stenosis and restoration of TIMI grade III flow.  Right coronary contains moderate luminal irregularities throughout the mid segment but no high-grade  stenosis. Normal left main Severe segmental proximal to mid 85% stenosis in the LAD. Large branching ramus intermedius with 2 severe stenoses. Circumflex with proximal to mid focal 80 to 90% stenosis.  Second obtuse marginal contains 70% stenosis. LV function is normal.  Initial LVEDP was 8 mmHg. Hypotension during the procedure related to RV involvement that improved with reperfusion, volume, and low-dose Levophed which was discontinued prior to discharge from the Cath Lab.  RECOMMENDATIONS:  We need to determine if the patient requires lifetime Eliquis therapy (for PE).  Last Eliquis was last evening. She was already on Plavix and is allergic to aspirin (hives).  Plan to continue Plavix, will hold Eliquis for now, and make a decision about resumption of anticoagulation with heparin or Eliquis based upon decision concerning left coronary disease within the next 12 to 24 hours. Left coronary disease needs to be treated.  The LAD and circumflex could be easily treated.  The ramus intermedius would be more problematic.  Coronary bypass grafting would likely be a problem in this patient who has had some of her saphenous veins used for lower extremity bypass grafting.  Findings Coronary Findings Diagnostic  Dominance: Right  Left Anterior Descending Prox LAD to Mid LAD lesion is 90% stenosed.  Ramus Intermedius Vessel is large. Ramus-1 lesion is 70% stenosed. Ramus-2 lesion is 85% stenosed.  Left Circumflex Prox Cx lesion is 90% stenosed.  Second Obtuse Marginal Branch Vessel is large in size. 2nd Mrg lesion is 70% stenosed.  Right Coronary Artery Prox RCA-1 lesion is 100% stenosed. Prox RCA-2 lesion is 40% stenosed. Prox RCA to Mid RCA lesion is 35% stenosed. Mid RCA lesion is 55% stenosed.  Intervention  Prox RCA-1 lesion Stent A drug-eluting stent was successfully placed. Post-Intervention Lesion Assessment The intervention was successful. Pre-interventional TIMI flow is  0. Post-intervention TIMI flow is 3. No complications occurred at this lesion. There is a 15% residual stenosis post intervention.     ECHOCARDIOGRAM  ECHOCARDIOGRAM COMPLETE 06/12/2022  Narrative ECHOCARDIOGRAM REPORT    Patient Name:   Selena Clark Date of Exam: 06/12/2022 Medical Rec #:  [id number]        Height:       62.5 in Accession #:    [id number]       Weight:       193.6 lb Date of Birth:  1961/01/27        BSA:          1.897 m Patient Age:    69 years         BP:           95/29 mmHg Patient Gender: F                HR:           75 bpm. Exam Location:  Inpatient  Procedure: 2D Echo, Color Doppler and Cardiac Doppler  Indications:    Myocardial Infact I21,9  History:        Patient has prior history of Echocardiogram examinations, most recent 03/22/2020. STEMI, PAD, Signs/Symptoms:Chest Pain; Risk Factors:Current Smoker and Dyslipidemia.  Sonographer:    Greer Pickerel Referring Phys: [place]   Sonographer Comments: Suboptimal subcostal window and suboptimal parasternal window. Image  acquisition challenging due to patient body habitus and Image acquisition challenging due to respiratory motion. IMPRESSIONS   1. Left ventricular ejection fraction, by estimation, is 55 to 60%. The left ventricle has normal function. The left ventricle demonstrates regional wall motion abnormalities with basal inferolateral hypokinesis. Left ventricular diastolic parameters are consistent with Grade I diastolic dysfunction (impaired relaxation). 2. Right ventricular systolic function is mildly reduced. The right ventricular size is mildly enlarged. There is normal pulmonary artery systolic pressure. The estimated right ventricular systolic pressure is 66.0 mmHg. 3. The mitral valve is normal in structure. No evidence of mitral valve regurgitation. No evidence of mitral stenosis. 4. The aortic valve is tricuspid. Aortic valve regurgitation is not visualized. No aortic  stenosis is present. 5. The inferior vena cava is normal in size with greater than 50% respiratory variability, suggesting right atrial pressure of 3 mmHg.  FINDINGS Left Ventricle: Left ventricular ejection fraction, by estimation, is 55 to 60%. The left ventricle has normal function. The left ventricle demonstrates regional wall motion abnormalities. The left ventricular internal cavity size was normal in size. There is no left ventricular hypertrophy. Left ventricular diastolic parameters are consistent with Grade I diastolic dysfunction (impaired relaxation).  Right Ventricle: The right ventricular size is mildly enlarged. No increase in right ventricular wall thickness. Right ventricular systolic function is mildly reduced. There is normal pulmonary artery systolic pressure. The tricuspid regurgitant velocity is 1.57 m/s, and with an assumed right atrial pressure of 3 mmHg, the estimated right ventricular systolic pressure is 60.0 mmHg.  Left Atrium: Left atrial size was normal in size.  Right Atrium: Right atrial size was normal in size.  Pericardium: There is no evidence of pericardial effusion.  Mitral Valve: The mitral valve is normal in structure. No evidence of mitral valve regurgitation. No evidence of mitral valve stenosis.  Tricuspid Valve: The tricuspid valve is normal in structure. Tricuspid valve regurgitation is trivial.  Aortic Valve: The aortic valve is tricuspid. Aortic valve regurgitation is not visualized. No aortic stenosis is present.  Pulmonic Valve: The pulmonic valve was normal in structure. Pulmonic valve regurgitation is not visualized.  Aorta: The aortic root is normal in size and structure.  Venous: The inferior vena cava is normal in size with greater than 50% respiratory variability, suggesting right atrial pressure of 3 mmHg.  IAS/Shunts: No atrial level shunt detected by color flow Doppler.   LEFT VENTRICLE PLAX 2D LVIDd:         3.90 cm    Diastology LVIDs:         2.80 cm   LV e' medial:    10.20 cm/s LV PW:         1.00 cm   LV E/e' medial:  8.0 LV IVS:        0.80 cm   LV e' lateral:   11.00 cm/s LVOT diam:     2.00 cm   LV E/e' lateral: 7.5 LV SV:         78 LV SV Index:   41 LVOT Area:     3.14 cm   RIGHT VENTRICLE RV S prime:     8.92 cm/s TAPSE (M-mode): 2.2 cm  LEFT ATRIUM             Index        RIGHT ATRIUM          Index LA diam:        3.50 cm 1.85 cm/m   RA Area:  9.70 cm LA Vol (A2C):   42.0 ml 22.15 ml/m  RA Volume:   18.50 ml 9.75 ml/m LA Vol (A4C):   36.1 ml 19.03 ml/m LA Biplane Vol: 38.9 ml 20.51 ml/m AORTIC VALVE LVOT Vmax:   110.00 cm/s LVOT Vmean:  75.700 cm/s LVOT VTI:    0.247 m  AORTA Ao Root diam: 2.80 cm Ao Asc diam:  2.90 cm  MITRAL VALVE               TRICUSPID VALVE MV Area (PHT): 5.66 cm    TR Peak grad:   9.9 mmHg MV Decel Time: 134 msec    TR Vmax:        157.00 cm/s MR Peak grad: 6.0 mmHg MR Mean grad: 3.0 mmHg     SHUNTS MR Vmax:      122.00 cm/s  Systemic VTI:  0.25 m MR Vmean:     87.1 cm/s    Systemic Diam: 2.00 cm MV E velocity: 82.00 cm/s MV A velocity: 90.40 cm/s MV E/A ratio:  0.91  Dalton McleanMD Electronically signed by Franki Monte Signature Date/Time: 06/12/2022/1:29:41 PM    Final              History of Present Illness:   Selena Clark is a 61 y.o. female with the above problem list.  She was admitted 11/7-11/10 with an inferior STEMI. She underwent emergent cardiac catheterization which demonstrated an occluded pRCA which was tx with a DES. She was noted to have severe disease in the RI, pLAD and pLCx. She was brought back to the cath lab for a staged PCI and treated with a DES to the LAD and LCx. Her RI disease will be treated medically.  EF was preserved on echocardiogram. She is not on ASA due to allergy. She is on long term anticoagulation due to a hx of recurrent DVT/PE. Therefore, she was maintained on Plavix + Eliquis. Of note,  gabapentin and tizanidine was reduced due to somnolence. Pt will need f/u Lipids as an OP to recheck LDL on high dose Crestor.   She returns for f/u.  She is here with her husband.  Since discharge from the hospital, she has felt fatigued.  This is improving slowly.  She has not had chest pain, shortness of breath, syncope, orthopnea, leg edema.  She is interested in taking Chantix to help her quit smoking.  She has used this in the past with great success.  She had no significant side effects.  Of note, she had some concerns about her previous surgeries with her previous cardiologist at [facility]  She was under the impression that she underwent triple bypass surgery.  She showed me the scar on her abdomen.  I reviewed the operative note from her lower extremity bypass in 2013.  The incision from her xiphoid down her abdomen is related to her aortobifemoral bypass.  I reviewed this with her today.        Past Medical History:  Diagnosis Date   CAD (coronary artery disease)    Coronary artery disease    Depression    Fibromyalgia    Headache    History of pulmonary embolus (PE)    HTN (hypertension)    Hypercholesteremia    Hypertension    Current Medications: Current Meds  Medication Sig   apixaban (ELIQUIS) 5 MG TABS tablet Take 1 tablet (5 mg total) by mouth 2 (two) times daily.   benzonatate (TESSALON) 100 MG capsule Take 1  capsule (100 mg total) by mouth 3 (three) times daily as needed for cough.   clopidogrel (PLAVIX) 75 MG tablet Take 75 mg by mouth daily.   dicyclomine (BENTYL) 20 MG tablet Take 20 mg by mouth as needed for spasms ((IBS)).   escitalopram (LEXAPRO) 20 MG tablet Take 20 mg by mouth daily.   gabapentin (NEURONTIN) 100 MG capsule Take 1 capsule (100 mg total) by mouth 3 (three) times daily.   metoprolol succinate (TOPROL XL) 50 MG 24 hr tablet Take 1 tablet (50 mg total) by mouth daily. Take with or immediately following a meal.   nitroGLYCERIN (NITROSTAT) 0.4 MG SL  tablet Place 1 tablet (0.4 mg total) under the tongue every 5 (five) minutes as needed for chest pain (up to 3 doses. If taking 3rd dose call 911).   oxyCODONE-acetaminophen (PERCOCET) 10-325 MG tablet Take 1 tablet by mouth 3 (three) times daily as needed.   pantoprazole (PROTONIX) 40 MG tablet Take 40 mg by mouth daily.   rosuvastatin (CRESTOR) 40 MG tablet Take 40 mg by mouth daily.   solifenacin (VESICARE) 10 MG tablet Take 10 mg by mouth daily.   tiZANidine (ZANAFLEX) 2 MG tablet Take 1 tablet (2 mg total) by mouth 3 (three) times daily.   topiramate (TOPAMAX) 100 MG tablet Take 100 mg by mouth daily.   traZODone (DESYREL) 100 MG tablet Take 200 mg by mouth at bedtime as needed for sleep.   varenicline (CHANTIX) 1 MG tablet Take 1 tablet (1 mg total) by mouth 2 (two) times daily.   Varenicline Tartrate, Starter, (CHANTIX STARTING MONTH PAK) 0.5 MG X 11 & 1 MG X 42 TBPK Take 1 packet by mouth as directed.   [DISCONTINUED] losartan (COZAAR) 50 MG tablet Take 1 tablet (50 mg total) by mouth daily.    Allergies:   Albuterol and Aspirin   Social History   Occupational History   Not on file  Tobacco Use   Smoking status: Every Day    Packs/day: 1.00    Types: Cigarettes   Smokeless tobacco: Never  Substance and Sexual Activity   Alcohol use: No   Drug use: No   Sexual activity: Not on file    Family Hx: The patient's family history includes Hypertension in her mother.  ROS   EKGs/Labs/Other Test Reviewed:    EKG:  EKG is  ordered today.  The ekg ordered today demonstrates NSR, HR 71, normal axis, nonspecific ST-T wave changes, QTc 447   Recent Labs: 06/11/2022: ALT 10; B Natriuretic Peptide 10.9 06/14/2022: BUN 9; Creatinine, Ser 0.80; Hemoglobin 14.6; Platelets 162; Potassium 3.9; Sodium 140   Recent Lipid Panel Recent Labs    06/11/22 1559  CHOL 200  TRIG 97  HDL 37*  VLDL 19  LDLCALC 144*      Risk Assessment/Calculations/Metrics:              Physical  Exam:    VS:  BP 122/60   Pulse 71   Ht 5' 2.5" (1.588 m)   Wt 189 lb 9.6 oz (86 kg)   SpO2 93%   BMI 34.13 kg/m     Wt Readings from Last 3 Encounters:  06/24/22 189 lb 9.6 oz (86 kg)  06/12/22 193 lb 9 oz (87.8 kg)  04/01/20 196 lb 3.4 oz (89 kg)    Constitutional:      Appearance: Healthy appearance. Not in distress.  Neck:     Vascular: JVD normal.  Pulmonary:  Effort: Pulmonary effort is normal.     Breath sounds: No wheezing. No rales.  Cardiovascular:     Normal rate. Regular rhythm. Normal S1. Normal S2.      Murmurs: There is no murmur.     Comments: Right wrist without hematoma Edema:    Peripheral edema absent.  Abdominal:     Palpations: Abdomen is soft.  Skin:    General: Skin is warm and dry.  Neurological:     Mental Status: Alert and oriented to person, place and time.         ASSESSMENT & PLAN:   Acute ST elevation myocardial infarction (STEMI) of inferior wall (HCC) Status post inferior STEMI treated with DES to the proximal RCA.  She underwent staged PCI with DES to the proximal LAD and DES to the proximal LCx.  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  She is doing well without anginal symptoms.  We discussed the rationale for dual antiplatelet therapy.  However, she does have an aspirin allergy.  She also requires long-term anticoagulation therapy with Eliquis.  Continue Eliquis 5 mg twice daily, Plavix 75 mg daily, Toprol-XL 50 mg daily, losartan 50 mg daily, Crestor 40 mg daily.  I have encouraged her to start cardiac rehabilitation.  In light of Dr. Thompson Caul retirement, I will have her follow-up with Dr. Burt Knack and me.  Follow-up in 3 months.  Essential hypertension Blood pressure is well controlled.  Continue losartan 50 mg daily, Toprol-XL 50 mg daily.  History of pulmonary embolism She remains on long-term anticoagulation therapy.  Continue Eliquis 5 mg twice daily.  Hyperlipidemia LDL goal  <70 Continue store 40 mg daily.  Obtain follow-up CMET, lipids in 6 to 8 weeks.  Tobacco use She would like to quit smoking.  She has used Chantix in the past with great success.  She did not have any significant side effects.  Prescription has been written for Chantix starter pack as well as Chantix maintenance pack with several refills.  Chronic pain syndrome Some of her medications were reduced in the hospital secondary to hypersomnolence.  I have asked her to follow-up with her primary care provider for refills on gabapentin as well as tizanidine.     Cardiac Rehabilitation Eligibility Assessment  The patient is ready to start cardiac rehabilitation from a cardiac standpoint. (Pt walks with a cane due to back problems.)          Dispo:  Return in about 3 months (around 09/24/2022) for Routine Follow Up, w/ Dr. Burt Knack, or Richardson Dopp, PA-C.   Medication Adjustments/Labs and Tests Ordered: Current medicines are reviewed at length with the patient today.  Concerns regarding medicines are outlined above.  Tests Ordered: Orders Placed This Encounter  Procedures   Comp Met (CMET)   Lipid panel   EKG 12-Lead   Medication Changes: Meds ordered this encounter  Medications   losartan (COZAAR) 50 MG tablet    Sig: Take 1 tablet (50 mg total) by mouth daily.    Dispense:  90 tablet    Refill:  2   Varenicline Tartrate, Starter, (CHANTIX STARTING MONTH PAK) 0.5 MG X 11 & 1 MG X 42 TBPK    Sig: Take 1 packet by mouth as directed.    Dispense:  1 each    Refill:  0   varenicline (CHANTIX) 1 MG tablet    Sig: Take 1 tablet (1 mg total) by mouth 2 (two) times daily.  Dispense:  60 tablet    Refill:  3   Signed, Richardson Dopp, PA-C  06/24/2022 5:35 PM    [address], [place], [place]  [address] Phone: [phone]; Fax: [phone]

## 2022-06-24 NOTE — Assessment & Plan Note (Signed)
She would like to quit smoking.  She has used Chantix in the past with great success.  She did not have any significant side effects.  Prescription has been written for Chantix starter pack as well as Chantix maintenance pack with several refills.

## 2022-06-24 NOTE — Assessment & Plan Note (Signed)
Continue store 40 mg daily.  Obtain follow-up CMET, lipids in 6 to 8 weeks.

## 2022-08-06 ENCOUNTER — Ambulatory Visit: Payer: Medicare HMO | Attending: Physician Assistant

## 2022-08-06 DIAGNOSIS — I10 Essential (primary) hypertension: Secondary | ICD-10-CM

## 2022-08-06 DIAGNOSIS — I2119 ST elevation (STEMI) myocardial infarction involving other coronary artery of inferior wall: Secondary | ICD-10-CM

## 2022-08-06 LAB — COMPREHENSIVE METABOLIC PANEL
ALT: 13 IU/L (ref 0–32)
AST: 18 IU/L (ref 0–40)
Albumin/Globulin Ratio: 1.6 (ref 1.2–2.2)
Albumin: 3.8 g/dL (ref 3.8–4.9)
Alkaline Phosphatase: 76 IU/L (ref 44–121)
BUN/Creatinine Ratio: 11 (ref 9–23)
BUN: 11 mg/dL (ref 6–24)
Bilirubin Total: <0.2 mg/dL (ref 0.0–1.2)
CO2: 23 mmol/L (ref 20–29)
Calcium: 9.1 mg/dL (ref 8.7–10.2)
Chloride: 104 mmol/L (ref 96–106)
Creatinine, Ser: 0.96 mg/dL (ref 0.57–1.00)
Globulin, Total: 2.4 g/dL (ref 1.5–4.5)
Glucose: 102 mg/dL — ABNORMAL HIGH (ref 70–99)
Potassium: 3.5 mmol/L (ref 3.5–5.2)
Sodium: 140 mmol/L (ref 134–144)
Total Protein: 6.2 g/dL (ref 6.0–8.5)
eGFR: 68 mL/min/{1.73_m2} (ref >59)

## 2022-08-06 LAB — LIPID PANEL
Chol/HDL Ratio: 4 ratio (ref 0.0–4.4)
Cholesterol, Total: 178 mg/dL (ref 100–199)
HDL: 44 mg/dL (ref >39)
LDL Chol Calc (NIH): 108 mg/dL — ABNORMAL HIGH (ref 0–99)
Triglycerides: 145 mg/dL (ref 0–149)
VLDL Cholesterol Cal: 26 mg/dL (ref 5–40)

## 2022-08-07 ENCOUNTER — Telehealth: Payer: Self-pay | Admitting: *Deleted

## 2022-08-07 DIAGNOSIS — Z79899 Other long term (current) drug therapy: Secondary | ICD-10-CM

## 2022-08-07 MED ORDER — METOPROLOL SUCCINATE ER 50 MG PO TB24: 50.0000 mg | ORAL_TABLET | Freq: Every day | ORAL | 3 refills | Status: AC

## 2022-08-07 MED ORDER — EZETIMIBE 10 MG PO TABS: 10.0000 mg | ORAL_TABLET | Freq: Every day | ORAL | 3 refills | Status: DC

## 2022-08-07 MED ORDER — LOSARTAN POTASSIUM 50 MG PO TABS: 50.0000 mg | ORAL_TABLET | Freq: Every day | ORAL | 3 refills | Status: DC

## 2022-08-07 NOTE — Telephone Encounter (Signed)
-----   Message from Liliane Shi, [place] sent at 08/07/2022 11:17 AM EST ----- Results sent to [name] via [facility]. See MyChart comment. PLAN: - Continue Crestor 40 mg once daily - Add Zetia 10 mg once daily - Fasting Lipids, LFTs in 6-8 weeks Richardson Dopp, PA-C    08/07/2022 11:12 AM

## 2022-08-08 ENCOUNTER — Telehealth (HOSPITAL_COMMUNITY): Payer: Self-pay

## 2022-08-08 ENCOUNTER — Encounter (HOSPITAL_COMMUNITY): Payer: Self-pay

## 2022-08-08 NOTE — Telephone Encounter (Signed)
Pt returned phone call and stated that she is not interested in the cardiac rehab program. Closed referral. 

## 2022-09-12 NOTE — Progress Notes (Signed)
Cardiology Office Note:    Date:  09/13/2022   ID:  Selena Clark, DOB 1960/12/12, MRN [id number]  PCP:  Kateri Mc, MD  [place] Providers Cardiologist:  Sherren Mocha, MD Cardiology APP:  Sharmon Revere    Referring MD: Kateri Mc, MD   Patient Profile: Coronary artery disease Inf STEMI 06/11/22 S/p 3.5 x 22 mm Synergy DES to pRCA S/p staged PCI w 3 x 15 mm DES to the pLAD; 2.5 x 12 mm DES to the pLCx Original Cath 06/11/22: pLAD 90, RI 70, 80; pLCx 90, OM2 92; pRCA 100  Echo 06/12/2022: EF 55-60, inferolateral HK, GR 1 DD, mildly reduced RVSF, normal PASP (RVSP 12.9), RAP 3 Peripheral arterial disease  S/p aorto-bifem bypass Aortic atherosclerosis  Hx of recurrent DVT/pulmonary embolism  ASA Allergy  Hypertension  Hyperlipidemia  +Cigs >> quit Jan 2024    History of Present Illness:   Selena Clark is a 62 y.o. female with the above problem list.  She was last seen 06/24/22 for post hospital f/u. Please see that note for details. She returns for f/u on CAD.  She is here alone.  She is doing well without chest pain, shortness of breath, syncope, near syncope, dizziness, orthopnea or leg edema.     Reviewed and updated this encounter:  Tobacco  Allergies  Meds  Problems  Med Hx  Surg Hx  Fam Hx     ROS  Labs/Other Test Reviewed:   Recent Labs: 06/11/2022: B Natriuretic Peptide 10.9 06/14/2022: Hemoglobin 14.6; Platelets 162 08/06/2022: ALT 13; BUN 11; Creatinine, Ser 0.96; Potassium 3.5; Sodium 140   Recent Lipid Panel Recent Labs    06/11/22 1559 08/06/22 0848  CHOL 200 178  TRIG 97 145  HDL 37* 44  VLDL 19  --   LDLCALC 144* 108*     Risk Assessment/Calculations/Metrics:             Physical Exam:   VS:  BP 90/60   Pulse 80   Ht 5' 2.5" (1.588 m)   Wt 181 lb 3.2 oz (82.2 kg)   SpO2 94%   BMI 32.61 kg/m    Wt Readings from Last 3 Encounters:  09/13/22 181 lb 3.2 oz (82.2 kg)  06/24/22 189 lb 9.6 oz (86 kg)   06/12/22 193 lb 9 oz (87.8 kg)    Constitutional:      Appearance: Healthy appearance. Not in distress.  Neck:     Vascular: JVD normal.  Pulmonary:     Breath sounds: Normal breath sounds. No wheezing. No rales.  Cardiovascular:     Normal rate. Regular rhythm. Normal S1. Normal S2.      Murmurs: There is no murmur.  Edema:    Peripheral edema absent.  Abdominal:     Palpations: Abdomen is soft.         ASSESSMENT & PLAN:   Essential hypertension Blood pressure is running low.  She notes low blood pressures at home.  Decrease losartan to 25 mg daily.  Continue Toprol-XL 50 mg daily.  I have asked her to contact us if her systolic pressure continues to run below 100.  Coronary artery disease involving native heart without angina pectoris Status post inferior STEMI in November 2023 treated with DES to the proximal RCA. She underwent staged PCI with DES to the proximal LAD and DES to the proximal LCx. 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  She continues to do well without symptoms to suggest angina.  She has an allergy to aspirin.  Continue Plavix 75 mg daily, Toprol-XL 50 mg daily, Crestor 40 mg daily.  Hyperlipidemia LDL goal <70 She is now on Crestor 40 mg daily and Zetia 10 mg daily.  Follow-up lipids pending.  If LDL remains above 70, refer to Pharm.D. lipid clinic for consideration of PCSK9 inhibitor.  History of pulmonary embolism She remains on long-term anticoagulation with Eliquis.  Tobacco use She has quit smoking.  I have congratulated her on this today.        Dispo:  Return in about 6 months (around 03/14/2023) for Routine Follow Up, w/ Dr. Burt Knack.   Signed, Richardson Dopp, PA-C  09/13/2022 11:32 AM    [facility], [place], [place]  [address] Phone: [phone]; Fax: [phone]

## 2022-09-13 ENCOUNTER — Encounter: Payer: Self-pay | Admitting: Physician Assistant

## 2022-09-13 ENCOUNTER — Ambulatory Visit: Payer: Medicare HMO | Attending: Physician Assistant | Admitting: Physician Assistant

## 2022-09-13 VITALS — BP 90/60 | HR 80 | Ht 62.5 in | Wt 181.2 lb

## 2022-09-13 DIAGNOSIS — I2510 Atherosclerotic heart disease of native coronary artery without angina pectoris: Secondary | ICD-10-CM

## 2022-09-13 DIAGNOSIS — Z86711 Personal history of pulmonary embolism: Secondary | ICD-10-CM

## 2022-09-13 DIAGNOSIS — Z720 Tobacco use: Secondary | ICD-10-CM

## 2022-09-13 DIAGNOSIS — I10 Essential (primary) hypertension: Secondary | ICD-10-CM | POA: Diagnosis not present

## 2022-09-13 DIAGNOSIS — E785 Hyperlipidemia, unspecified: Secondary | ICD-10-CM

## 2022-09-13 MED ORDER — LOSARTAN POTASSIUM 25 MG PO TABS: 25.0000 mg | ORAL_TABLET | Freq: Every day | ORAL | 3 refills | Status: DC

## 2022-09-13 NOTE — Assessment & Plan Note (Signed)
She remains on long-term anticoagulation with Eliquis.

## 2022-09-13 NOTE — Patient Instructions (Signed)
Medication Instructions:  Your physician has recommended you make the following change in your medication:   Decrease Losartan to 14m daily   *If you need a refill on your cardiac medications before your next appointment, please call your pharmacy*   Follow-Up: At [facility] you and your health needs are our priority.  As part of our continuing mission to provide you with exceptional heart care, we have created designated Provider Care Teams.  These Care Teams include your primary Cardiologist (physician) and Advanced Practice Providers (APPs -  Physician Assistants and Nurse Practitioners) who all work together to provide you with the care you need, when you need it.  Your next appointment:   6 month(s)  Provider:   MSherren Mocha MD

## 2022-09-13 NOTE — Assessment & Plan Note (Signed)
Status post inferior STEMI in November 2023 treated with DES to the proximal RCA. She underwent staged PCI with DES to the proximal LAD and DES to the proximal LCx. 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  She continues to do well without symptoms to suggest angina.  She has an allergy to aspirin.  Continue Plavix 75 mg daily, Toprol-XL 50 mg daily, Crestor 40 mg daily.

## 2022-09-13 NOTE — Assessment & Plan Note (Signed)
She is now on Crestor 40 mg daily and Zetia 10 mg daily.  Follow-up lipids pending.  If LDL remains above 70, refer to Pharm.D. lipid clinic for consideration of PCSK9 inhibitor.

## 2022-09-13 NOTE — Assessment & Plan Note (Signed)
Blood pressure is running low.  She notes low blood pressures at home.  Decrease losartan to 25 mg daily.  Continue Toprol-XL 50 mg daily.  I have asked her to contact us if her systolic pressure continues to run below 100.

## 2022-09-13 NOTE — Assessment & Plan Note (Signed)
She has quit smoking.  I have congratulated her on this today.

## 2022-09-27 ENCOUNTER — Ambulatory Visit: Payer: Medicare HMO

## 2022-10-01 ENCOUNTER — Ambulatory Visit: Payer: Medicare HMO | Attending: Physician Assistant

## 2023-03-27 ENCOUNTER — Encounter: Payer: Self-pay | Admitting: Cardiovascular Disease

## 2023-03-27 ENCOUNTER — Ambulatory Visit: Payer: Medicare HMO | Attending: Cardiovascular Disease | Admitting: Cardiovascular Disease

## 2023-03-27 VITALS — BP 104/76 | HR 71 | Ht 62.5 in | Wt 180.2 lb

## 2023-03-27 DIAGNOSIS — E785 Hyperlipidemia, unspecified: Secondary | ICD-10-CM | POA: Diagnosis not present

## 2023-03-27 DIAGNOSIS — I10 Essential (primary) hypertension: Secondary | ICD-10-CM

## 2023-03-27 DIAGNOSIS — Z720 Tobacco use: Secondary | ICD-10-CM | POA: Diagnosis not present

## 2023-03-27 DIAGNOSIS — I2510 Atherosclerotic heart disease of native coronary artery without angina pectoris: Secondary | ICD-10-CM | POA: Diagnosis not present

## 2023-03-27 NOTE — Progress Notes (Signed)
Cardiology Office Note:    Date:  03/27/2023   ID:  Selena, Clark [birth year], MRN [id number]  PCP:  Regino Bellow, MD   [facility] Providers Cardiologist:  Tonny Bollman, MD Cardiology APP:  Kennon Rounds     Referring MD: Regino Bellow, MD   Chief Complaint  Patient presents with   Coronary Artery Disease    History of Present Illness:    Selena Clark is a 62 y.o. female with a hx of:  Inf STEMI 06/11/22 S/p 3.5 x 22 mm Synergy DES to pRCA S/p staged PCI w 3 x 15 mm DES to the pLAD; 2.5 x 12 mm DES to the pLCx Original Cath 06/11/22: pLAD 90, RI 70, 80; pLCx 90, OM2 70; pRCA 100  Echo 06/12/2022: EF 55-60, inferolateral HK, GR 1 DD, mildly reduced RVSF, normal PASP (RVSP 12.9), RAP 3 Peripheral arterial disease  S/p aorto-bifem bypass Aortic atherosclerosis  Hx of recurrent DVT/pulmonary embolism  ASA Allergy  Hypertension  Hyperlipidemia  +Cigs >> quit Jan 2024  The patient is here alone today.  She is doing remarkably well at this time.  She just took a cruise to the [place] and had a great time.  She has stayed off of cigarettes since her heart attack.  She has lost about 10 pounds through lifestyle modification.  She denies chest pain, chest pressure, or shortness of breath.  She has been compliant with her medications.  She is not having any claudication symptoms.  She has developed a petechial rash and is under evaluation by hematology.  Past Medical History:  Diagnosis Date   CAD (coronary artery disease)    Coronary artery disease    Depression    Fibromyalgia    Headache    History of pulmonary embolus (PE)    HTN (hypertension)    Hypercholesteremia    Hypertension     Past Surgical History:  Procedure Laterality Date   BACK SURGERY     CARDIAC SURGERY     CARPAL TUNNEL RELEASE     CHOLECYSTECTOMY     CORONARY ARTERY BYPASS GRAFT     CORONARY STENT INTERVENTION N/A 06/13/2022   Procedure: CORONARY STENT INTERVENTION;   Surgeon: Kathleene Hazel, MD;  Location: [facility] INVASIVE CV [facility];  Service: Cardiovascular;  Laterality: N/A;   CORONARY/GRAFT ACUTE MI REVASCULARIZATION N/A 06/11/2022   Procedure: Coronary/Graft Acute MI Revascularization;  Surgeon: Lyn Records, MD;  Location: [facility] INVASIVE CV LAB;  Service: Cardiovascular;  Laterality: N/A;   HERNIA REPAIR     LEFT HEART CATH AND CORONARY ANGIOGRAPHY N/A 06/11/2022   Procedure: LEFT HEART CATH AND CORONARY ANGIOGRAPHY;  Surgeon: Lyn Records, MD;  Location: [facility] INVASIVE CV [facility];  Service: Cardiovascular;  Laterality: N/A;   TUBAL LIGATION      Current Medications: Current Meds  Medication Sig   benzonatate (TESSALON) 100 MG capsule Take 1 capsule (100 mg total) by mouth 3 (three) times daily as needed for cough.   clopidogrel (PLAVIX) 75 MG tablet Take 75 mg by mouth daily.   dicyclomine (BENTYL) 20 MG tablet Take 20 mg by mouth as needed for spasms ((IBS)).   escitalopram (LEXAPRO) 20 MG tablet Take 20 mg by mouth daily.   ezetimibe (ZETIA) 10 MG tablet Take 1 tablet (10 mg total) by mouth daily.   losartan (COZAAR) 25 MG tablet Take 1 tablet (25 mg total) by mouth daily.   metoprolol succinate (TOPROL XL) 50 MG  24 hr tablet Take 1 tablet (50 mg total) by mouth daily. Take with or immediately following a meal.   nitroGLYCERIN (NITROSTAT) 0.4 MG SL tablet Place 1 tablet (0.4 mg total) under the tongue every 5 (five) minutes as needed for chest pain (up to 3 doses. If taking 3rd dose call 911).   oxyCODONE-acetaminophen (PERCOCET) 10-325 MG tablet Take 1 tablet by mouth 3 (three) times daily as needed.   pantoprazole (PROTONIX) 40 MG tablet Take 40 mg by mouth daily.   rosuvastatin (CRESTOR) 40 MG tablet Take 40 mg by mouth daily.   solifenacin (VESICARE) 10 MG tablet Take 10 mg by mouth daily.   tiZANidine (ZANAFLEX) 2 MG tablet Take 1 tablet (2 mg total) by mouth 3 (three) times daily.   topiramate (TOPAMAX) 100 MG tablet Take 100 mg by mouth daily.    traZODone (DESYREL) 100 MG tablet Take 200 mg by mouth at bedtime as needed for sleep.     Allergies:   Albuterol and Aspirin   Social History   Socioeconomic History   Marital status: Married    Spouse name: Not on file   Number of children: Not on file   Years of education: Not on file   Highest education level: Not on file  Occupational History   Not on file  Tobacco Use   Smoking status: Every Day    Current packs/day: 1.00    Types: Cigarettes   Smokeless tobacco: Never  Substance and Sexual Activity   Alcohol use: No   Drug use: No   Sexual activity: Not on file  Other Topics Concern   Not on file  Social History Narrative   ** Merged History Encounter **       Social Determinants of Health   Financial Resource Strain: Low Risk  (08/14/2022)   Received from [facility], [facility]   Overall Financial Resource Strain (CARDIA)    Difficulty of Paying Living Expenses: Not very hard  Food Insecurity: Patient Declined (08/14/2022)   Received from [facility], [facility]   Hunger Vital Sign    Worried About Running Out of Food in the Last Year: Patient declined    Ran Out of Food in the Last Year: Patient declined  Transportation Needs: No Transportation Needs (08/14/2022)   Received from [employer], [facility]   PRAPARE - Transportation    Lack of Transportation (Medical): No    Lack of Transportation (Non-Medical): No  Physical Activity: Insufficiently Active (08/14/2022)   Received from [facility], [facility]   Exercise Vital Sign    Days of Exercise per Week: 3 days    Minutes of Exercise per Session: 30 min  Stress: No Stress Concern Present (08/14/2022)   Received from [facility], [facility] - Occupational Stress Questionnaire    Feeling of Stress : Only a little  Social Connections: Socially Integrated (08/14/2022)   Received from [facility], [facility]   Social Network    How  would you rate your social network (family, work, friends)?: Good participation with social networks     Family History: The patient's family history includes Hypertension in her mother.  ROS:   Please see the history of present illness.    All other systems reviewed and are negative.  EKGs/Labs/Other Studies Reviewed:    The following studies were reviewed today: Echo 06/12/2022: 1. Left ventricular ejection fraction, by estimation, is 55 to 60%. The  left ventricle has  normal function. The left ventricle demonstrates  regional wall motion abnormalities with basal inferolateral hypokinesis.  Left ventricular diastolic parameters  are consistent with Grade I diastolic dysfunction (impaired relaxation).   2. Right ventricular systolic function is mildly reduced. The right  ventricular size is mildly enlarged. There is normal pulmonary artery  systolic pressure. The estimated right ventricular systolic pressure is  12.9 mmHg.   3. The mitral valve is normal in structure. No evidence of mitral valve  regurgitation. No evidence of mitral stenosis.   4. The aortic valve is tricuspid. Aortic valve regurgitation is not  visualized. No aortic stenosis is present.   5. The inferior vena cava is normal in size with greater than 50%  respiratory variability, suggesting right atrial pressure of 3 mmHg.   EKG Interpretation Date/Time:  Thursday March 27 2023 10:14:55 EDT Ventricular Rate:  68 PR Interval:  192 QRS Duration:  84 QT Interval:  446 QTC Calculation: 474 R Axis:   19  Text Interpretation: Normal sinus rhythm Nonspecific T wave abnormality Prolonged QT - mild When compared with ECG of 14-Jun-2022 07:33, Nonspecific T wave abnormality has replaced inverted T waves in Inferior leads Confirmed by Tonny Bollman [id number]) on 03/27/2023 10:28:31 AM    Recent Labs: 06/11/2022: B Natriuretic Peptide 10.9 06/14/2022: Hemoglobin 14.6; Platelets 162 08/06/2022: ALT 13; BUN 11; Creatinine,  Ser 0.96; Potassium 3.5; Sodium 140  Recent Lipid Panel    Component Value Date/Time   CHOL 178 08/06/2022 0848   TRIG 145 08/06/2022 0848   HDL 44 08/06/2022 0848   CHOLHDL 4.0 08/06/2022 0848   CHOLHDL 5.4 06/11/2022 1559   VLDL 19 06/11/2022 1559   LDLCALC 108 (H) 08/06/2022 0848     Risk Assessment/Calculations:                Physical Exam:    VS:  BP 104/76   Pulse 71   Ht 5' 2.5" (1.588 m)   Wt 180 lb 3.2 oz (81.7 kg)   SpO2 95%   BMI 32.43 kg/m     Wt Readings from Last 3 Encounters:  03/27/23 180 lb 3.2 oz (81.7 kg)  09/13/22 181 lb 3.2 oz (82.2 kg)  06/24/22 189 lb 9.6 oz (86 kg)     GEN:  Well nourished, well developed in no acute distress HEENT: Normal NECK: No JVD; No carotid bruits LYMPHATICS: No lymphadenopathy CARDIAC: RRR, no murmurs, rubs, gallops RESPIRATORY:  Clear to auscultation without rales, wheezing or rhonchi  ABDOMEN: Soft, non-tender, non-distended MUSCULOSKELETAL:  No edema; No deformity  SKIN: Warm and dry with petechial rash on the lower extremities noted NEUROLOGIC:  Alert and oriented x 3 PSYCHIATRIC:  Normal affect   ASSESSMENT:    1. Hyperlipidemia LDL goal <70   2. Essential hypertension   3. Coronary artery disease involving native coronary artery of native heart without angina pectoris   4. Tobacco use    PLAN:    In order of problems listed above:  She has had an excellent response to Zetia and rosuvastatin with a recent LDL cholesterol of 50 mg/dL.  Continue current management. Blood pressure is under optimal control on losartan and metoprolol succinate No anginal symptoms.  Continue clopidogrel for antiplatelet therapy in the context of aspirin allergy.  Risk reduction measures as above.  Maintained on long-term oral anticoagulation and setting of recurrent DVT/PE. Remains quit since her heart attack last year.  I applauded her efforts at tobacco cessation and lifestyle modification.  She is compliant  with her  medications.  She seems to be doing all of the right things and we will plan to see her back in 6 months.     Medication Adjustments/Labs and Tests Ordered: Current medicines are reviewed at length with the patient today.  Concerns regarding medicines are outlined above.  Orders Placed This Encounter  Procedures   EKG 12-Lead   No orders of the defined types were placed in this encounter.   Patient Instructions  Medication Instructions:  Your physician recommends that you continue on your current medications as directed. Please refer to the Current Medication list given to you today.  *If you need a refill on your cardiac medications before your next appointment, please call your pharmacy*   Lab Work: NONE If you have labs (blood work) drawn today and your tests are completely normal, you will receive your results only by: MyChart Message (if you have MyChart) OR A paper copy in the mail If you have any lab test that is abnormal or we need to change your treatment, we will call you to review the results.   Testing/Procedures: NONE   Follow-Up: At [facility], you and your health needs are our priority.  As part of our continuing mission to provide you with exceptional heart care, we have created designated Provider Care Teams.  These Care Teams include your primary Cardiologist (physician) and Advanced Practice Providers (APPs -  Physician Assistants and Nurse Practitioners) who all work together to provide you with the care you need, when you need it.   Your next appointment:   6 month(s)  Provider:   Tonny Bollman, MD        Signed, Tonny Bollman, MD  03/27/2023 10:50 AM    [facility]

## 2023-03-27 NOTE — Patient Instructions (Signed)
Medication Instructions:  Your physician recommends that you continue on your current medications as directed. Please refer to the Current Medication list given to you today.  *If you need a refill on your cardiac medications before your next appointment, please call your pharmacy*   Lab Work: NONE  If you have labs (blood work) drawn today and your tests are completely normal, you will receive your results only by: MyChart Message (if you have MyChart) OR A paper copy in the mail If you have any lab test that is abnormal or we need to change your treatment, we will call you to review the results.   Testing/Procedures: NONE   Follow-Up: At [facility], you and your health needs are our priority.  As part of our continuing mission to provide you with exceptional heart care, we have created designated Provider Care Teams.  These Care Teams include your primary Cardiologist (physician) and Advanced Practice Providers (APPs -  Physician Assistants and Nurse Practitioners) who all work together to provide you with the care you need, when you need it.  Your next appointment:   6 month(s)  Provider:   Tonny Bollman, MD

## 2023-06-20 ENCOUNTER — Other Ambulatory Visit: Payer: Self-pay | Admitting: Physician Assistant

## 2023-07-07 ENCOUNTER — Other Ambulatory Visit: Payer: Self-pay | Admitting: Physician Assistant

## 2024-01-30 ENCOUNTER — Other Ambulatory Visit: Payer: Self-pay | Admitting: Physician Assistant

## 2024-04-16 ENCOUNTER — Other Ambulatory Visit: Payer: Self-pay | Admitting: Physician Assistant

## 2024-04-22 ENCOUNTER — Other Ambulatory Visit: Payer: Self-pay | Admitting: Physician Assistant

## 2024-06-09 ENCOUNTER — Encounter: Payer: Self-pay | Admitting: Physician Assistant

## 2024-06-09 ENCOUNTER — Ambulatory Visit: Attending: Physician Assistant | Admitting: Physician Assistant

## 2024-06-09 VITALS — BP 115/81 | HR 92 | Ht 62.0 in | Wt 172.0 lb

## 2024-06-09 DIAGNOSIS — Z720 Tobacco use: Secondary | ICD-10-CM

## 2024-06-09 DIAGNOSIS — E785 Hyperlipidemia, unspecified: Secondary | ICD-10-CM

## 2024-06-09 DIAGNOSIS — I10 Essential (primary) hypertension: Secondary | ICD-10-CM | POA: Diagnosis not present

## 2024-06-09 DIAGNOSIS — Z86711 Personal history of pulmonary embolism: Secondary | ICD-10-CM

## 2024-06-09 DIAGNOSIS — I2510 Atherosclerotic heart disease of native coronary artery without angina pectoris: Secondary | ICD-10-CM

## 2024-06-09 NOTE — Assessment & Plan Note (Signed)
 LDL cholesterol is at optimal level with current treatment. - Continue rosuvastatin  40 mg daily - Continue Zetia  10 mg daily

## 2024-06-09 NOTE — Assessment & Plan Note (Signed)
 On long-term anticoagulation with Eliquis , which is managed by primary care.

## 2024-06-09 NOTE — Assessment & Plan Note (Signed)
 Blood pressure is well-controlled on current regimen. - Continue losartan  25 mg daily - Continue Toprol -xl 50 mg daily

## 2024-06-09 NOTE — Assessment & Plan Note (Signed)
I have recommended cessation. 

## 2024-06-09 NOTE — Assessment & Plan Note (Addendum)
 Status post inferior STEMI in November 2023 treated with DES to the proximal RCA. She underwent staged PCI with DES to the proximal LAD and DES to the proximal LCx. 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  Currently asymptomatic with no chest discomfort or angina.  She is allergic to aspirin . - Continue Plavix  75 mg daily - Continue rosuvastatin  40 mg daily - Continue Zetia  10 mg daily - Follow-up 1 year

## 2024-06-09 NOTE — Patient Instructions (Signed)
 Medication Instructions:  No changes. *If you need a refill on your cardiac medications before your next appointment, please call your pharmacy*   Follow-Up: At [facility], you and your health needs are our priority.  As part of our continuing mission to provide you with exceptional heart care, our providers are all part of one team.  This team includes your primary Cardiologist (physician) and Advanced Practice Providers or APPs (Physician Assistants and Nurse Practitioners) who all work together to provide you with the care you need, when you need it.  Your next appointment:   12 month(s)  Provider:   Ozell Fell, MD or Glendia Ferrier, PA-C          We recommend signing up for the patient portal called MyChart.  Sign up information is provided on this After Visit Summary.  MyChart is used to connect with patients for Virtual Visits (Telemedicine).  Patients are able to view lab/test results, encounter notes, upcoming appointments, etc.  Non-urgent messages can be sent to your provider as well.   To learn more about what you can do with MyChart, go to [email or web address].   Other Instructions

## 2024-06-09 NOTE — Progress Notes (Signed)
 OFFICE NOTE:    Date:  06/09/2024  ID:  Selena Clark, DOB Jan 11, 1961, MRN [id number] PCP: Bonner Cindia MATSU, MD  [facility] Providers Cardiologist:  Ozell Fell, MD Cardiology APP:  Lelon Glendia DASEN, PA-C        Coronary artery disease Inf STEMI 06/11/22 S/p 3.5 x 22 mm Synergy DES to pRCA S/p staged PCI w 3 x 15 mm DES to the pLAD; 2.5 x 12 mm DES to the pLCx Original Cath 06/11/22: pLAD 90, RI 70, 80; pLCx 90, OM2 70; pRCA 100  Echo 06/12/2022: EF 55-60, inferolateral HK, GR 1 DD, mildly reduced RVSF, normal PASP (RVSP 12.9), RAP 3 Peripheral arterial disease  S/p aorto-bifem bypass Aortic atherosclerosis  Hx of recurrent DVT/pulmonary embolism  Long term anticoagulation  ASA Allergy  Hypertension  Hyperlipidemia  +Cigs >> quit Jan 2024        Discussed the use of AI scribe software for clinical note transcription with the patient, who gave verbal consent to proceed. History of Present Illness Selena Clark is a 63 y.o. female for follow up of CAD. Last seen by Dr. Fell in 03/2023.   She quit smoking a few years ago. However, she resumed smoking about five or six months ago, currently smoking six or seven cigarettes a day. She wants to quit again. No shortness of breath, palpitations, syncope, orthopnea, or peripheral edema.  She has not had any chest discomfort with exertion.  She uses a cane for back pain.    ROS-See HPI    Studies Reviewed:  EKG Interpretation Date/Time:  Wednesday June 09 2024 14:28:15 EST Ventricular Rate:  93 PR Interval:  178 QRS Duration:  80 QT Interval:  390 QTC Calculation: 486 R Axis:   32  Text Interpretation: Normal sinus rhythm Nonspecific T wave abnormality Prolonged QT No significant change since last tracing Confirmed by Lelon Glendia [phone]) on 06/09/2024 2:57:46 PM    Labs - CareEverywhere 05/25/24: Hgb 14.1, PLT 161K, TC 114, Trig 111, HDL 43, LDL 51, SCr 0.89, K 3.9, ALT 10, Alb 3.6, TP 5.8, A1c 5.6          Physical Exam:  VS:  BP 115/81   Pulse 92   Ht 5' 2 (1.575 m)   Wt 172 lb (78 kg)   SpO2 92%   BMI 31.46 kg/m        Wt Readings from Last 3 Encounters:  06/09/24 172 lb (78 kg)  03/27/23 180 lb 3.2 oz (81.7 kg)  09/13/22 181 lb 3.2 oz (82.2 kg)    Constitutional:      Appearance: Healthy appearance. Not in distress.  Pulmonary:     Breath sounds: Normal breath sounds. No wheezing. No rales.  Cardiovascular:     Normal rate. Regular rhythm.     Murmurs: There is no murmur.  Edema:    Peripheral edema absent.  Abdominal:     Palpations: Abdomen is soft.       Assessment and Plan:    Assessment & Plan Coronary artery disease involving native coronary artery of native heart without angina pectoris Status post inferior STEMI in November 2023 treated with DES to the proximal RCA. She underwent staged PCI with DES to the proximal LAD and DES to the proximal LCx. 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  Currently asymptomatic with no chest discomfort or angina.  She is allergic to aspirin . - Continue Plavix  75 mg daily -  Continue rosuvastatin  40 mg daily - Continue Zetia  10 mg daily - Follow-up 1 year Essential hypertension Blood pressure is well-controlled on current regimen. - Continue losartan  25 mg daily - Continue Toprol -xl 50 mg daily Hyperlipidemia LDL goal <70 LDL cholesterol is at optimal level with current treatment. - Continue rosuvastatin  40 mg daily - Continue Zetia  10 mg daily History of pulmonary embolism On long-term anticoagulation with Eliquis , which is managed by primary care.  Tobacco use I have recommended cessation.        Dispo:  Return in about 1 year (around 06/09/2025) for Routine Follow Up, w/ Dr. Wonda, or Glendia Ferrier, PA-C.  Signed, Glendia Ferrier, PA-C

## 2024-06-30 LAB — COLOGUARD: COLOGUARD: NEGATIVE

## 2024-07-12 ENCOUNTER — Other Ambulatory Visit: Payer: Self-pay | Admitting: Physician Assistant

## 2024-07-12 ENCOUNTER — Other Ambulatory Visit: Payer: Self-pay | Admitting: Cardiovascular Disease
